# Patient Record
Sex: Male | Born: 1950 | Race: White | Hispanic: No | Smoking: Former smoker
Health system: Southern US, Community
[De-identification: ages and names within clinical notes are randomized; demographics above are authoritative.]

## PROBLEM LIST (undated history)

## (undated) DIAGNOSIS — E663 Overweight: Secondary | ICD-10-CM

## (undated) DIAGNOSIS — R319 Hematuria, unspecified: Secondary | ICD-10-CM

## (undated) DIAGNOSIS — E785 Hyperlipidemia, unspecified: Secondary | ICD-10-CM

## (undated) DIAGNOSIS — G4730 Sleep apnea, unspecified: Secondary | ICD-10-CM

## (undated) DIAGNOSIS — R972 Elevated prostate specific antigen [PSA]: Secondary | ICD-10-CM

## (undated) DIAGNOSIS — N529 Male erectile dysfunction, unspecified: Secondary | ICD-10-CM

## (undated) DIAGNOSIS — I480 Paroxysmal atrial fibrillation: Secondary | ICD-10-CM

## (undated) DIAGNOSIS — IMO0002 Reserved for concepts with insufficient information to code with codable children: Secondary | ICD-10-CM

## (undated) DIAGNOSIS — C61 Malignant neoplasm of prostate: Secondary | ICD-10-CM

## (undated) DIAGNOSIS — I499 Cardiac arrhythmia, unspecified: Secondary | ICD-10-CM

## (undated) HISTORY — DX: Cardiac arrhythmia, unspecified: I49.9

## (undated) HISTORY — DX: Hematuria, unspecified: R31.9

## (undated) HISTORY — DX: Elevated prostate specific antigen (PSA): R97.20

## (undated) HISTORY — PX: TONSILLECTOMY: SUR1361

## (undated) HISTORY — DX: Hyperlipidemia, unspecified: E78.5

## (undated) HISTORY — PX: COLONOSCOPY: SHX174

## (undated) HISTORY — PX: CATARACT EXTRACTION: SUR2

## (undated) HISTORY — DX: Malignant neoplasm of prostate: C61

## (undated) HISTORY — PX: TONSILECTOMY, ADENOIDECTOMY, BILATERAL MYRINGOTOMY AND TUBES: SHX2538

## (undated) HISTORY — PX: EYE SURGERY: SHX253

## (undated) HISTORY — DX: Overweight: E66.3

## (undated) HISTORY — DX: Reserved for concepts with insufficient information to code with codable children: IMO0002

## (undated) HISTORY — PX: SEPTOPLASTY: SUR1290

## (undated) HISTORY — DX: Male erectile dysfunction, unspecified: N52.9

---

## 2006-02-16 ENCOUNTER — Ambulatory Visit: Payer: Self-pay | Admitting: Gastroenterology

## 2006-12-09 ENCOUNTER — Ambulatory Visit: Payer: Self-pay | Admitting: Ophthalmology

## 2009-07-16 ENCOUNTER — Ambulatory Visit: Payer: Self-pay | Admitting: Gastroenterology

## 2010-12-04 ENCOUNTER — Ambulatory Visit: Payer: Self-pay | Admitting: Ophthalmology

## 2010-12-11 ENCOUNTER — Ambulatory Visit: Payer: Self-pay | Admitting: Ophthalmology

## 2014-04-05 DIAGNOSIS — I459 Conduction disorder, unspecified: Secondary | ICD-10-CM | POA: Insufficient documentation

## 2014-04-05 DIAGNOSIS — N529 Male erectile dysfunction, unspecified: Secondary | ICD-10-CM | POA: Insufficient documentation

## 2014-04-13 DIAGNOSIS — E785 Hyperlipidemia, unspecified: Secondary | ICD-10-CM | POA: Insufficient documentation

## 2015-01-08 DIAGNOSIS — C61 Malignant neoplasm of prostate: Secondary | ICD-10-CM | POA: Insufficient documentation

## 2015-01-19 ENCOUNTER — Ambulatory Visit
Admit: 2015-01-19 | Disposition: A | Discharge status: Home / Self Care | Payer: Self-pay | Attending: Gastroenterology | Admitting: Gastroenterology

## 2015-02-12 LAB — SURGICAL PATHOLOGY

## 2015-02-26 ENCOUNTER — Other Ambulatory Visit: Payer: Self-pay

## 2015-02-27 ENCOUNTER — Ambulatory Visit: Payer: Self-pay | Admitting: Physical Therapy

## 2015-03-12 ENCOUNTER — Other Ambulatory Visit: Payer: Self-pay

## 2015-03-13 ENCOUNTER — Inpatient Hospital Stay: Admission: RE | Admit: 2015-03-13 | Payer: 59 | Source: Ambulatory Visit | Admitting: Urology

## 2015-03-13 ENCOUNTER — Encounter: Admission: RE | Payer: Self-pay | Source: Ambulatory Visit

## 2015-03-13 SURGERY — ROBOTIC ASSISTED LAPAROSCOPIC RADICAL PROSTATECTOMY
Anesthesia: Choice

## 2015-04-13 ENCOUNTER — Ambulatory Visit (INDEPENDENT_AMBULATORY_CARE_PROVIDER_SITE_OTHER): Payer: 59 | Admitting: Urology

## 2015-04-13 ENCOUNTER — Encounter: Payer: Self-pay | Admitting: Urology

## 2015-04-13 ENCOUNTER — Other Ambulatory Visit: Payer: Self-pay | Admitting: Urology

## 2015-04-13 VITALS — Resp 18 | Ht 75.0 in | Wt 241.6 lb

## 2015-04-13 DIAGNOSIS — N529 Male erectile dysfunction, unspecified: Secondary | ICD-10-CM

## 2015-04-13 DIAGNOSIS — C61 Malignant neoplasm of prostate: Secondary | ICD-10-CM

## 2015-04-13 DIAGNOSIS — N528 Other male erectile dysfunction: Secondary | ICD-10-CM

## 2015-04-13 MED ORDER — LEVOFLOXACIN 500 MG PO TABS
500.0000 mg | ORAL_TABLET | Freq: Once | ORAL | Status: AC
  Administered 2015-04-13: 500 mg via ORAL

## 2015-04-13 MED ORDER — GENTAMICIN SULFATE 40 MG/ML IJ SOLN
80.0000 mg | Freq: Once | INTRAMUSCULAR | Status: AC
  Administered 2015-04-13: 80 mg via INTRAMUSCULAR

## 2015-04-13 NOTE — Patient Instructions (Signed)

## 2015-04-13 NOTE — Progress Notes (Signed)
IM Injection  Patient is present today for a prostate biopsy; he was given a prophylactic IM injection prior to the procedure: Drug: Gentamicin Dose:70ml Location: Gluteus maximus  Lot: [id number] Exp:11/2015 Patient tolerated well, no complications were noted Preformed by: Golden Hurter, CMA  Additional notes/ Follow up: Patient was also administered PO 500mg  tablet of Levofloxacin. Batch: [id number] Exp: 12/2015 Patient tolerated well, no complications were noted.

## 2015-04-13 NOTE — Progress Notes (Signed)
04/13/2015 9:50 AM   Joshua Barajas 1951-09-02 [id number]  Referring provider: [name], MD [place], [place] [address]  Chief Complaint  Patient presents with  . Prostate Cancer  . Prostate Biopsy    HPI:  64 year old male with prostate cancer dx 2/16 initially scheduled to undergo robotic prostatectomy but has since been exploring various treatment options and returns today for confirmatory prostate biopsy.  Patient underwent prostate biopsy for an accelerating PSA velocity most recently to 3.2. Prostate biopsy revealed Gleason 3+3 prostate cancer involving 6 of 12 cores bilaterally, 4 on the LEFT (lateral base, lateral mid, and lateral apex) and 2 on the RIGHT (base up to 83% tissue). His prostate exam was fairly unremarkable, diffusely firm. TRUS vol 26 cc.   He denies any significant baseline urinary symptoms other than nocturia 3. IPSS 3/0.   He does have signnificant baseline erectile dysfunction with difficultty achieving an adequate erection and maintaining it at times. SHIM 18/25 consistent with mild ED. Over the more recent past, his ED has actually been improving and he has been sexually active with adequate erection for pentration without PDE5.   No previous abdominal surgeries. No family history of prostate cancer.   PMH: Past Medical History  Diagnosis Date  . ED (erectile dysfunction)   . Dysrhythmia   . HLD (hyperlipidemia)   . Squamous cell carcinoma   . Hematuria   . Prostate cancer   . Over weight   . Rising PSA level     Surgical History: Past Surgical History  Procedure Laterality Date  . Tonsilectomy, adenoidectomy, bilateral myringotomy and tubes    . Septoplasty    . Cataract extraction Bilateral     Home Medications:    Medication List       This list is accurate as of: 04/13/15  9:50 AM.  Always use your most recent med list.               polyethylene  glycol powder powder  Commonly known as:  GLYCOLAX/MIRALAX  USE AS DIRECTED FOR COLONIC PREP.        Allergies:  Allergies  Allergen Reactions  . Latex Rash  . Other Rash    If leaves on for few days    Family History: No family history on file.  Social History:  reports that he quit smoking about 39 years ago. He does not have any smokeless tobacco history on file. He reports that he does not drink alcohol. His drug history is not on file.   Physical Exam: Resp 18  Ht 6\' 3"  (1.905 m)  Wt 241 lb 9.6 oz (109.589 kg)  BMI 30.20 kg/m2  Constitutional:  Alert and oriented, No acute distress. HEENT: [place] AT, moist mucus membranes.  Trachea midline, no masses. Cardiovascular: No clubbing, cyanosis, or edema. Respiratory: Normal respiratory effort, no increased work of breathing. GU: No CVA tenderness.  Rectal: Sphincter tone. No rectal fissures or hemorrhoids.   Skin: No rashes, bruises or suspicious lesions. Neurologic: Grossly intact, no focal deficits, moving all 4 extremities. Psychiatric: Normal mood and affect.  Prostate Biopsy Procedure   Informed consent was obtained after discussing risks/benefits of the procedure.  A time out was performed to ensure correct patient identity.  Pre-Procedure: - Gentamicin given prophylactically - Levaquin 500 mg administered PO -Transrectal Ultrasound performed revealing a 21 gm prostate -No significant hypoechoic or median lobe noted  Procedure: - Prostate block performed using  10 cc 1% lidocaine and biopsies taken from sextant areas, a total of 12 under ultrasound guidance.  Post-Procedure: - Patient tolerated the procedure well - He was counseled to seek immediate medical attention if experiences any severe pain, significant bleeding, or fevers - Return in one week to discuss biopsy results   Assessment & Plan:  64 year old male with prostate cancer considering all treatment options. He returns today for confirmatory prostate  biopsy which was uncomplicated. He did have patient return in 1-2 weeks to review results, further discuss his options.  1. Prostate cancer T1c PSA 3.2. Prostate biopsy revealed Gleason 3+3 prostate cancer involving 6 of 12 cores bilaterally, 4 on the LEFT (lateral base, lateral mid, and lateral apex) and 2 on the RIGHT (base up to 83% tissue). Prostate exam firm, no nodules. TRUS vol 26 cc. Dx 11/2014. - PSA - levofloxacin (LEVAQUIN) tablet 500 mg; Take 1 tablet (500 mg total) by mouth once. - gentamicin (GARAMYCIN) injection 80 mg; Inject 2 mLs (80 mg total) into the muscle once.  2. ED (erectile dysfunction) of organic origin  Baseline mild ED   Return in about 1 week (around 04/20/2015) for review pathology results.  Hollice Espy, MD  [facility] [address], [place], [place] [address] [phone]

## 2015-04-14 LAB — PSA: Prostate Specific Ag, Serum: 3.4 ng/mL (ref 0.0–4.0)

## 2015-04-18 LAB — PROSTATE BIOPSIES

## 2015-04-20 ENCOUNTER — Ambulatory Visit: Payer: Self-pay | Admitting: Urology

## 2015-04-25 ENCOUNTER — Other Ambulatory Visit: Payer: Self-pay | Admitting: Urology

## 2015-04-26 ENCOUNTER — Ambulatory Visit (INDEPENDENT_AMBULATORY_CARE_PROVIDER_SITE_OTHER): Payer: 59 | Admitting: Urology

## 2015-04-26 ENCOUNTER — Encounter: Payer: Self-pay | Admitting: Urology

## 2015-04-26 VITALS — BP 130/69 | HR 62 | Ht 75.0 in | Wt 240.2 lb

## 2015-04-26 DIAGNOSIS — N528 Other male erectile dysfunction: Secondary | ICD-10-CM

## 2015-04-26 DIAGNOSIS — C61 Malignant neoplasm of prostate: Secondary | ICD-10-CM

## 2015-04-26 DIAGNOSIS — N529 Male erectile dysfunction, unspecified: Secondary | ICD-10-CM

## 2015-04-26 NOTE — Progress Notes (Signed)
4:53 PM   Joshua Barajas [birth year] [id number]  Referring provider: [name], MD [place], [place] [address]  Chief Complaint  Patient presents with  . Prostate Cancer    prostate biopsy results    HPI:  64 year old male with prostate cancer dx 2/16 who returns today to discuss his confirmatory prostate biopsy results.  Patient underwent prostate biopsy for an accelerating PSA velocity most recently to 3.2. Initial prostate biopsy 2/16 revealed Gleason 3+3 prostate cancer involving 6 of 12 cores bilaterally, 4 on the LEFT (lateral base, lateral mid, and lateral apex) and 2 on the RIGHT (base up to 83% tissue). His prostate exam was fairly unremarkable, diffusely firm. TRUS vol 26 cc.    Repeat prostate biopsy on 04/13/15 shows Gleason 3+3 in 6/12 cores, 4 on the right and 2 on the left this time involving up on 33% at the left apex but otherwise farily low volume diease.  Repeat PSA on 04/14/15 3.4.    He denies any significant baseline urinary symptoms other than nocturia 3. IPSS 3/0.   He does have signnificant baseline erectile dysfunction with difficultty achieving an adequate erection and maintaining it at times. SHIM 18/25 consistent with mild ED. Over the more recent past, his ED has actually been improving and he has been sexually active with adequate erection for pentration without PDE5.   No previous abdominal surgeries. No family history of prostate cancer.  No issues since last biopsy.   He is very anxious and has been reading a ton online about alterative treatments for prostate cancer.  He has seen radiation oncology and [facility] is not a candidate for cyberknife and not trilled with the idea of brachytherapy or EBRT.  I has questions today about photon therapy, cryotherapy, and HIFU.   PMH: Past Medical History  Diagnosis Date  . ED (erectile dysfunction)   . Dysrhythmia   . HLD (hyperlipidemia)   . Squamous cell carcinoma   .  Hematuria   . Prostate cancer   . Over weight   . Rising PSA level     Surgical History: Past Surgical History  Procedure Laterality Date  . Tonsilectomy, adenoidectomy, bilateral myringotomy and tubes    . Septoplasty    . Cataract extraction Bilateral     Home Medications:    Medication List    Notice  As of 04/26/2015  4:53 PM   You have not been prescribed any medications.      Allergies:  Allergies  Allergen Reactions  . Latex Rash  . Other Rash    If leaves on for few days    Family History: History reviewed. No pertinent family history.  Social History:  reports that he quit smoking about 39 years ago. He does not have any smokeless tobacco history on file. He reports that he does not drink alcohol. His drug history is not on file.   Physical Exam: BP 130/69 mmHg  Pulse 62  Ht 6\' 3"  (1.905 m)  Wt 240 lb 3.2 oz (108.954 kg)  BMI 30.02 kg/m2  Constitutional:  Alert and oriented, No acute distress. HEENT: [place] AT, moist mucus membranes.  Trachea midline, no masses. Cardiovascular: No clubbing, cyanosis, or edema. Respiratory: Normal respiratory effort, no increased work of breathing.  Skin: No rashes, bruises or suspicious lesions. Neurologic: Grossly intact, no focal deficits, moving all 4 extremities. Psychiatric: Normal mood and affect.  Labs:  Results for orders placed or performed in visit on 04/13/15  PSA  Result Value Ref Range   Prostate Specific Ag, Serum 3.4 0.0 - 4.0 ng/mL    Imaging: No recent imaging  Assessment and Plan: A 64 year old male with fairly newly diagnosed Gleason 3+3 prostate cancer 11/2014, low risk, status post repeat confirmatory biopsy revealing essentially unchanged pathology.    1. Prostate cancer Repeat prostate biopsy pathology reviewed today in detail along with most recent PSA.  He is quite anxious about making the right choice for treating his prostate cancer. We again today reviewed all of his options including  surgery, radiation, brachytherapy, active surveillance, along with other treatment modalities such as HIFU or cryotherapy. All his questions were answered in detail. I offered him a referral to Dr. Massie Maroon again today but he declined at this time.  He has already seen [facility] radiation oncology as well.    He and his wife remained undecided about how to proceed. Additional resources given today. For now, I will put him on active surveillance and repeat his PSA rectal exam in 4 months unless he would like to proceed with symptoms other intervention prior to this.  2. ED (erectile dysfunction) of organic origin  Baseline mild ED   Return in about 4 months (around 08/27/2015) for PSA/ DRE.  Hollice Espy, MD  [place] [address], [place], [place] [address] [phone]  I spent 30 min with this patient of which greater than 50% was spent in counseling and coordination of care with the patient.

## 2015-04-28 ENCOUNTER — Encounter: Payer: Self-pay | Admitting: Urology

## 2015-09-14 ENCOUNTER — Ambulatory Visit: Payer: Self-pay | Admitting: Urology

## 2015-09-17 ENCOUNTER — Ambulatory Visit: Payer: Self-pay | Admitting: Urology

## 2015-09-24 DIAGNOSIS — C61 Malignant neoplasm of prostate: Secondary | ICD-10-CM | POA: Insufficient documentation

## 2015-11-13 ENCOUNTER — Other Ambulatory Visit: Payer: Self-pay

## 2015-11-13 NOTE — Progress Notes (Signed)
  Oncology Nurse Navigator Documentation  Navigator Location: [facility] (11/13/15 1100)                                            Time Spent with Patient: 30 (11/13/15 1100)   Patient has underwent cyberknife radiation at [facility] for his prostate cancer. Treatments finished 10/12/15

## 2016-09-24 DIAGNOSIS — Z08 Encounter for follow-up examination after completed treatment for malignant neoplasm: Secondary | ICD-10-CM | POA: Diagnosis not present

## 2016-09-24 DIAGNOSIS — D485 Neoplasm of uncertain behavior of skin: Secondary | ICD-10-CM | POA: Diagnosis not present

## 2016-09-24 DIAGNOSIS — Z1283 Encounter for screening for malignant neoplasm of skin: Secondary | ICD-10-CM | POA: Diagnosis not present

## 2016-09-24 DIAGNOSIS — L720 Epidermal cyst: Secondary | ICD-10-CM | POA: Diagnosis not present

## 2016-09-24 DIAGNOSIS — C44622 Squamous cell carcinoma of skin of right upper limb, including shoulder: Secondary | ICD-10-CM | POA: Diagnosis not present

## 2016-09-24 DIAGNOSIS — Z85828 Personal history of other malignant neoplasm of skin: Secondary | ICD-10-CM | POA: Diagnosis not present

## 2016-10-02 DIAGNOSIS — C44622 Squamous cell carcinoma of skin of right upper limb, including shoulder: Secondary | ICD-10-CM | POA: Diagnosis not present

## 2016-10-03 DIAGNOSIS — Z510 Encounter for antineoplastic radiation therapy: Secondary | ICD-10-CM | POA: Diagnosis not present

## 2016-10-03 DIAGNOSIS — C61 Malignant neoplasm of prostate: Secondary | ICD-10-CM | POA: Diagnosis not present

## 2016-10-06 DIAGNOSIS — T814XXA Infection following a procedure, initial encounter: Secondary | ICD-10-CM | POA: Diagnosis not present

## 2016-10-06 DIAGNOSIS — L03113 Cellulitis of right upper limb: Secondary | ICD-10-CM | POA: Diagnosis not present

## 2016-10-06 DIAGNOSIS — S51801A Unspecified open wound of right forearm, initial encounter: Secondary | ICD-10-CM | POA: Diagnosis not present

## 2016-10-30 DIAGNOSIS — C61 Malignant neoplasm of prostate: Secondary | ICD-10-CM | POA: Diagnosis not present

## 2016-10-30 DIAGNOSIS — N529 Male erectile dysfunction, unspecified: Secondary | ICD-10-CM | POA: Diagnosis not present

## 2016-10-30 DIAGNOSIS — Z0000 Encounter for general adult medical examination without abnormal findings: Secondary | ICD-10-CM | POA: Diagnosis not present

## 2016-10-30 DIAGNOSIS — Z23 Encounter for immunization: Secondary | ICD-10-CM | POA: Diagnosis not present

## 2016-10-30 DIAGNOSIS — Z136 Encounter for screening for cardiovascular disorders: Secondary | ICD-10-CM | POA: Diagnosis not present

## 2016-10-31 DIAGNOSIS — Z0000 Encounter for general adult medical examination without abnormal findings: Secondary | ICD-10-CM | POA: Diagnosis not present

## 2016-11-20 ENCOUNTER — Encounter: Payer: Self-pay | Admitting: Emergency Medicine

## 2016-11-20 ENCOUNTER — Emergency Department: Payer: PPO

## 2016-11-20 ENCOUNTER — Inpatient Hospital Stay
Admission: EM | Admit: 2016-11-20 | Discharge: 2016-11-22 | DRG: 310 | Disposition: A | Discharge status: Home / Self Care | Payer: PPO | Attending: Internal Medicine | Admitting: Internal Medicine

## 2016-11-20 DIAGNOSIS — E785 Hyperlipidemia, unspecified: Secondary | ICD-10-CM | POA: Diagnosis present

## 2016-11-20 DIAGNOSIS — I4891 Unspecified atrial fibrillation: Secondary | ICD-10-CM | POA: Diagnosis not present

## 2016-11-20 DIAGNOSIS — I499 Cardiac arrhythmia, unspecified: Secondary | ICD-10-CM | POA: Diagnosis not present

## 2016-11-20 DIAGNOSIS — Z8546 Personal history of malignant neoplasm of prostate: Secondary | ICD-10-CM

## 2016-11-20 DIAGNOSIS — Z79899 Other long term (current) drug therapy: Secondary | ICD-10-CM | POA: Diagnosis not present

## 2016-11-20 DIAGNOSIS — Z8249 Family history of ischemic heart disease and other diseases of the circulatory system: Secondary | ICD-10-CM | POA: Diagnosis not present

## 2016-11-20 DIAGNOSIS — Z87891 Personal history of nicotine dependence: Secondary | ICD-10-CM | POA: Diagnosis not present

## 2016-11-20 DIAGNOSIS — R002 Palpitations: Secondary | ICD-10-CM | POA: Diagnosis not present

## 2016-11-20 DIAGNOSIS — R000 Tachycardia, unspecified: Secondary | ICD-10-CM | POA: Diagnosis not present

## 2016-11-20 LAB — CBC
HCT: 37.9 % — ABNORMAL LOW (ref 40.0–52.0)
Hemoglobin: 13 g/dL (ref 13.0–18.0)
MCH: 32.3 pg (ref 26.0–34.0)
MCHC: 34.4 g/dL (ref 32.0–36.0)
MCV: 94 fL (ref 80.0–100.0)
Platelets: 191 10*3/uL (ref 150–440)
RBC: 4.03 MIL/uL — AB (ref 4.40–5.90)
RDW: 13.4 % (ref 11.5–14.5)
WBC: 5 10*3/uL (ref 3.8–10.6)

## 2016-11-20 LAB — BASIC METABOLIC PANEL
Anion gap: 7 (ref 5–15)
BUN: 18 mg/dL (ref 6–20)
CALCIUM: 8.9 mg/dL (ref 8.9–10.3)
CHLORIDE: 108 mmol/L (ref 101–111)
CO2: 23 mmol/L (ref 22–32)
CREATININE: 0.92 mg/dL (ref 0.61–1.24)
Glucose, Bld: 132 mg/dL — ABNORMAL HIGH (ref 65–99)
Potassium: 3.8 mmol/L (ref 3.5–5.1)
SODIUM: 138 mmol/L (ref 135–145)

## 2016-11-20 LAB — MAGNESIUM: MAGNESIUM: 2 mg/dL (ref 1.7–2.4)

## 2016-11-20 MED ORDER — DILTIAZEM HCL 25 MG/5ML IV SOLN
INTRAVENOUS | Status: AC
  Filled 2016-11-20: qty 5

## 2016-11-20 MED ORDER — DILTIAZEM HCL 25 MG/5ML IV SOLN
20.0000 mg | Freq: Once | INTRAVENOUS | Status: AC
  Administered 2016-11-20: 20 mg via INTRAVENOUS

## 2016-11-20 MED ORDER — DILTIAZEM HCL 125 MG/25ML IV SOLN
Freq: Once | INTRAVENOUS | Status: AC
  Administered 2016-11-20: via INTRAVENOUS
  Filled 2016-11-20: qty 25

## 2016-11-20 MED ORDER — DEXTROSE 5 % IV SOLN
5.0000 mg/h | Freq: Once | INTRAVENOUS | Status: DC
  Filled 2016-11-20: qty 100

## 2016-11-20 NOTE — ED Provider Notes (Signed)
[facility] Emergency Department Provider Note    First MD Initiated Contact with Patient 11/20/16 2308     (approximate)  I have reviewed the triage vital signs and the nursing notes.   HISTORY  Chief Complaint Irregular Heart Beat    HPI Joshua Barajas is a 66 y.o. male with a history of dysrhythmia presents with acute onset of pounding heart rate that occurred at home late this afternoon. Patient states he was just doing chores around the house. Denies any true chest pain or pressure but feels that he cannot catch his breath due to palpitations. He is not on any chronic medications for this. Was seen for similar 2 years ago in [place]. Denies any history of heart attacks. Since states that he is been elevated recently orthopnea or lower extremity edema.   Past Medical History:  Diagnosis Date  . Dysrhythmia   . ED (erectile dysfunction)   . Hematuria   . HLD (hyperlipidemia)   . Over weight   . Prostate cancer ([place])   . Rising PSA level   . Squamous cell carcinoma    No family history on file. Past Surgical History:  Procedure Laterality Date  . CATARACT EXTRACTION Bilateral   . SEPTOPLASTY    . TONSILECTOMY, ADENOIDECTOMY, BILATERAL MYRINGOTOMY AND TUBES     Patient Active Problem List   Diagnosis Date Noted  . CA of prostate ([place]) 01/08/2015  . HLD (hyperlipidemia) 04/13/2014  . 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 ED (erectile dysfunction) of organic origin 04/05/2014      Prior to Admission medications   Not on File    Allergies Latex and Other    Social History Social History  Substance Use Topics  . Smoking status: Former Smoker    Quit date: 04/10/1976  . Smokeless tobacco: Not on file  . Alcohol use No    Review of Systems Patient denies headaches, rhinorrhea, blurry vision, numbness, shortness of breath, chest pain, edema, cough, abdominal pain, nausea, vomiting, diarrhea, dysuria, fevers, rashes or  hallucinations unless otherwise stated above in HPI. ____________________________________________   PHYSICAL EXAM:  VITAL SIGNS: Vitals:   11/20/16 2247 11/20/16 2300  BP: (!) 150/75 113/62  Pulse: (!) 155 90  Resp: 20 16  Temp: 98.2 F (36.8 C)     Constitutional: Alert and oriented. Well appearing and in no acute distress. Eyes: Conjunctivae are normal. PERRL. EOMI. Head: Atraumatic. Nose: No congestion/rhinnorhea. Mouth/Throat: Mucous membranes are moist.  Oropharynx non-erythematous. Neck: No stridor. Painless ROM. No cervical spine tenderness to palpation Hematological/Lymphatic/Immunilogical: No cervical lymphadenopathy. Cardiovascular: tachycardic, irregularly irregular. Grossly normal heart sounds.  Good peripheral circulation. Respiratory: Normal respiratory effort.  No retractions. Lungs CTAB. Gastrointestinal: Soft and nontender. No distention. No abdominal bruits. No CVA tenderness. Genitourinary:  Musculoskeletal: No lower extremity tenderness nor edema.  No joint effusions. Neurologic:  Normal speech and language. No gross focal neurologic deficits are appreciated. No gait instability. Skin:  Skin is warm, dry and intact. No rash noted. Psychiatric: Mood and affect are normal. Speech and behavior are normal.  ____________________________________________   LABS (all labs ordered are listed, but only abnormal results are displayed)  Results for orders placed or performed during the hospital encounter of 11/20/16 (from the past 24 hour(s))  Basic metabolic panel     Status: Abnormal   Collection Time: 11/20/16 10:58 PM  Result Value Ref Range   Sodium 138 135 - 145 mmol/L   Potassium 3.8 3.5 - 5.1 mmol/L  Chloride 108 101 - 111 mmol/L   CO2 23 22 - 32 mmol/L   Glucose, Bld 132 (H) 65 - 99 mg/dL   BUN 18 6 - 20 mg/dL   Creatinine, Ser 0.92 0.61 - 1.24 mg/dL   Calcium 8.9 8.9 - 10.3 mg/dL   GFR calc non Af Amer >60 >60 mL/min   GFR calc Af Amer >60 >60  mL/min   Anion gap 7 5 - 15  CBC     Status: Abnormal   Collection Time: 11/20/16 10:58 PM  Result Value Ref Range   WBC 5.0 3.8 - 10.6 K/uL   RBC 4.03 (L) 4.40 - 5.90 MIL/uL   Hemoglobin 13.0 13.0 - 18.0 g/dL   HCT 37.9 (L) 40.0 - 52.0 %   MCV 94.0 80.0 - 100.0 fL   MCH 32.3 26.0 - 34.0 pg   MCHC 34.4 32.0 - 36.0 g/dL   RDW 13.4 11.5 - 14.5 %   Platelets 191 150 - 440 K/uL  Magnesium     Status: None   Collection Time: 11/20/16 10:58 PM  Result Value Ref Range   Magnesium 2.0 1.7 - 2.4 mg/dL   ____________________________________________  EKG My review and personal interpretation at Time: 19:31   Indication: palpitations  Rate: 140  Rhythm: afib with rvr Axis: normal Other: non specific st changes, likely rate dependents ____________________________________________  RADIOLOGY  I personally reviewed all radiographic images ordered to evaluate for the above acute complaints and reviewed radiology reports and findings.  These findings were personally discussed with the patient.  Please see medical record for radiology report.  ____________________________________________   PROCEDURES  Procedure(s) performed:  Procedures    Critical Care performed: yes CRITICAL CARE Performed by: Merlyn Lot   Total critical care time: 40 minutes  Critical care time was exclusive of separately billable procedures and treating other patients.  Critical care was necessary to treat or prevent imminent or life-threatening deterioration.  Critical care was time spent personally by me on the following activities: development of treatment plan with patient and/or surrogate as well as nursing, discussions with consultants, evaluation of patient's response to treatment, examination of patient, obtaining history from patient or surrogate, ordering and performing treatments and interventions, ordering and review of laboratory studies, ordering and review of radiographic studies, pulse  oximetry and re-evaluation of patient's condition.  ____________________________________________   INITIAL IMPRESSION / ASSESSMENT AND PLAN / ED COURSE  Pertinent labs & imaging results that were available during my care of the patient we  re reviewed by me and considered in my medical decision making (see chart for details).  DDX: dysrhythmia, acs, electrolye abn, dehydration  Uno K Qian is a 67 y.o. who presents to the ED with palpitations arriving with A. fib with RVR.patient normotensive therefore based on his rate elected to try to obtain rate control with IV Cardizem bolus and drip. Patient denying any active chest pain and there is no evidence of significant ischemia on EKG. I do feel this is likely underlying dysrhythmia. Patient without any significant electrolyte abnormality.  Based on patient's age and symptoms do feel patient will require admission for further titration and management of his A. fib with RVR.  Have discussed with the patient and available family all diagnostics and treatments performed thus far and all questions were answered to the best of my ability. The patient demonstrates understanding and agreement with plan.       ____________________________________________   FINAL CLINICAL IMPRESSION(S) / ED DIAGNOSES  Final  diagnoses:  Atrial fibrillation with rapid ventricular response (HCC)  Palpitations      NEW MEDICATIONS STARTED DURING THIS VISIT:  New Prescriptions   No medications on file     Note:  This document was prepared using Dragon voice recognition software and may include unintentional dictation errors.    Merlyn Lot, MD 11/21/16 [phone]

## 2016-11-20 NOTE — ED Triage Notes (Addendum)
Pt complains of bounding heartrate coming from home via EMS that started this afternoon.

## 2016-11-21 ENCOUNTER — Inpatient Hospital Stay: Admit: 2016-11-21 | Payer: PPO

## 2016-11-21 ENCOUNTER — Encounter: Payer: Self-pay | Admitting: Internal Medicine

## 2016-11-21 DIAGNOSIS — Z79899 Other long term (current) drug therapy: Secondary | ICD-10-CM | POA: Diagnosis not present

## 2016-11-21 DIAGNOSIS — R002 Palpitations: Secondary | ICD-10-CM | POA: Diagnosis not present

## 2016-11-21 DIAGNOSIS — Z87891 Personal history of nicotine dependence: Secondary | ICD-10-CM | POA: Diagnosis not present

## 2016-11-21 DIAGNOSIS — I4891 Unspecified atrial fibrillation: Secondary | ICD-10-CM | POA: Diagnosis present

## 2016-11-21 DIAGNOSIS — Z8546 Personal history of malignant neoplasm of prostate: Secondary | ICD-10-CM | POA: Diagnosis not present

## 2016-11-21 DIAGNOSIS — I480 Paroxysmal atrial fibrillation: Secondary | ICD-10-CM | POA: Diagnosis not present

## 2016-11-21 DIAGNOSIS — Z8249 Family history of ischemic heart disease and other diseases of the circulatory system: Secondary | ICD-10-CM | POA: Diagnosis not present

## 2016-11-21 DIAGNOSIS — E785 Hyperlipidemia, unspecified: Secondary | ICD-10-CM | POA: Diagnosis not present

## 2016-11-21 DIAGNOSIS — C61 Malignant neoplasm of prostate: Secondary | ICD-10-CM | POA: Diagnosis not present

## 2016-11-21 LAB — BASIC METABOLIC PANEL
ANION GAP: 6 (ref 5–15)
BUN: 12 mg/dL (ref 6–20)
CALCIUM: 8.6 mg/dL — AB (ref 8.9–10.3)
CO2: 23 mmol/L (ref 22–32)
CREATININE: 0.81 mg/dL (ref 0.61–1.24)
Chloride: 110 mmol/L (ref 101–111)
GFR calc Af Amer: >60 mL/min (ref >60)
Glucose, Bld: 102 mg/dL — ABNORMAL HIGH (ref 65–99)
Potassium: 3.6 mmol/L (ref 3.5–5.1)
Sodium: 139 mmol/L (ref 135–145)

## 2016-11-21 LAB — TROPONIN I
Troponin I: <0.03 ng/mL (ref <0.03)
Troponin I: <0.03 ng/mL (ref <0.03)
Troponin I: <0.03 ng/mL (ref <0.03)

## 2016-11-21 LAB — LIPID PANEL
Cholesterol: 143 mg/dL (ref 0–200)
HDL: 34 mg/dL — ABNORMAL LOW (ref >40)
LDL CALC: 96 mg/dL (ref 0–99)
TRIGLYCERIDES: 66 mg/dL (ref <150)
Total CHOL/HDL Ratio: 4.2 RATIO
VLDL: 13 mg/dL (ref 0–40)

## 2016-11-21 MED ORDER — DILTIAZEM HCL 125 MG/25ML IV SOLN
INTRAVENOUS | Status: DC
  Filled 2016-11-21 (×2): qty 25

## 2016-11-21 MED ORDER — APIXABAN 5 MG PO TABS
5.0000 mg | ORAL_TABLET | Freq: Two times a day (BID) | ORAL | Status: DC
  Administered 2016-11-21 – 2016-11-22 (×3): 5 mg via ORAL
  Filled 2016-11-21 (×3): qty 1

## 2016-11-21 MED ORDER — ONDANSETRON HCL 4 MG/2ML IJ SOLN: 4.0000 mg | Freq: Four times a day (QID) | INTRAMUSCULAR | Status: DC | PRN

## 2016-11-21 MED ORDER — ACETAMINOPHEN 325 MG PO TABS
650.0000 mg | ORAL_TABLET | ORAL | Status: DC | PRN
  Administered 2016-11-21: 650 mg via ORAL
  Filled 2016-11-21: qty 2

## 2016-11-21 MED ORDER — DILTIAZEM HCL 100 MG IV SOLR: 5.0000 mg/h | INTRAVENOUS | Status: DC

## 2016-11-21 MED ORDER — ENOXAPARIN SODIUM 40 MG/0.4ML ~~LOC~~ SOLN
40.0000 mg | SUBCUTANEOUS | Status: DC
  Administered 2016-11-21: 40 mg via SUBCUTANEOUS
  Filled 2016-11-21: qty 0.4

## 2016-11-21 MED ORDER — DILTIAZEM HCL 60 MG PO TABS
60.0000 mg | ORAL_TABLET | Freq: Four times a day (QID) | ORAL | Status: DC
  Administered 2016-11-21 – 2016-11-22 (×5): 60 mg via ORAL
  Filled 2016-11-21 (×4): qty 1

## 2016-11-21 MED ORDER — SODIUM CHLORIDE 0.9% FLUSH: 3.0000 mL | INTRAVENOUS | Status: DC | PRN

## 2016-11-21 MED ORDER — SODIUM CHLORIDE 0.9 % IV BOLUS (SEPSIS)
1000.0000 mL | Freq: Once | INTRAVENOUS | Status: AC
  Administered 2016-11-21: 1000 mL via INTRAVENOUS

## 2016-11-21 MED ORDER — ASPIRIN EC 81 MG PO TBEC
81.0000 mg | DELAYED_RELEASE_TABLET | Freq: Every day | ORAL | Status: DC
  Administered 2016-11-21 – 2016-11-22 (×2): 81 mg via ORAL
  Filled 2016-11-21 (×2): qty 1

## 2016-11-21 MED ORDER — SODIUM CHLORIDE 0.9% FLUSH
3.0000 mL | Freq: Two times a day (BID) | INTRAVENOUS | Status: DC
  Administered 2016-11-21 – 2016-11-22 (×3): 3 mL via INTRAVENOUS

## 2016-11-21 MED ORDER — TAMSULOSIN HCL 0.4 MG PO CAPS
0.4000 mg | ORAL_CAPSULE | Freq: Every day | ORAL | Status: DC
Start: 2016-11-21 — End: 2016-11-22
  Administered 2016-11-21 – 2016-11-22 (×2): 0.4 mg via ORAL
  Filled 2016-11-21 (×2): qty 1

## 2016-11-21 MED ORDER — SODIUM CHLORIDE 0.9 % IV SOLN: 250.0000 mL | INTRAVENOUS | Status: DC | PRN

## 2016-11-21 NOTE — Progress Notes (Signed)
[facility] [facility] [place] NAME: Joshua Barajas    MR#:  [id number]  DATE OF BIRTH:  Mar 30, 1951  SUBJECTIVE:  Came in with palpitations and found to be in afib with RVR.  No new complaints  REVIEW OF SYSTEMS:   Review of Systems  Constitutional: Negative for chills, fever and weight loss.  HENT: Negative for ear discharge, ear pain and nosebleeds.   Eyes: Negative for blurred vision, pain and discharge.  Respiratory: Negative for sputum production, shortness of breath, wheezing and stridor.   Cardiovascular: Positive for palpitations. Negative for chest pain, orthopnea and PND.  Gastrointestinal: Negative for abdominal pain, diarrhea, nausea and vomiting.  Genitourinary: Negative for frequency and urgency.  Musculoskeletal: Negative for back pain and joint pain.  Neurological: Negative for sensory change, speech change, focal weakness and weakness.  Psychiatric/Behavioral: Negative for depression and hallucinations. The patient is not nervous/anxious.    Tolerating Diet:yes Tolerating PT: ambulatory  DRUG ALLERGIES:   Allergies  Allergen Reactions  . Latex Rash  . Other Rash    If leaves on for few days    VITALS:  Blood pressure 104/67, pulse 74, temperature 97.7 F (36.5 C), temperature source Oral, resp. rate 20, height 6\' 3"  (1.905 m), weight 111.8 kg (246 lb 6.4 oz), SpO2 99 %.  PHYSICAL EXAMINATION:   Physical Exam  GENERAL:  66 y.o.-year-old patient lying in the bed with no acute distress.  EYES: Pupils equal, round, reactive to light and accommodation. No scleral icterus. Extraocular muscles intact.  HEENT: Head atraumatic, normocephalic. Oropharynx and nasopharynx clear.  NECK:  Supple, no jugular venous distention. No thyroid enlargement, no tenderness.  LUNGS: Normal breath sounds bilaterally, no wheezing, rales, rhonchi. No use of accessory muscles of respiration.  CARDIOVASCULAR: S1, S2 normal. No murmurs, rubs,  or gallops.  ABDOMEN: Soft, nontender, nondistended. Bowel sounds present. No organomegaly or mass.  EXTREMITIES: No cyanosis, clubbing or edema b/l.    NEUROLOGIC: Cranial nerves II through XII are intact. No focal Motor or sensory deficits b/l.   PSYCHIATRIC:  patient is alert and oriented x 3.  SKIN: No obvious rash, lesion, or ulcer.   LABORATORY PANEL:  CBC  Recent Labs Lab 11/20/16 2258  WBC 5.0  HGB 13.0  HCT 37.9*  PLT 191    Chemistries   Recent Labs Lab 11/20/16 2258 11/21/16 0612  NA 138 139  K 3.8 3.6  CL 108 110  CO2 23 23  GLUCOSE 132* 102*  BUN 18 12  CREATININE 0.92 0.81  CALCIUM 8.9 8.6*  MG 2.0  --    Cardiac Enzymes  Recent Labs Lab 11/21/16 0612  TROPONINI <0.03   RADIOLOGY:  Dg Chest 2 View  Result Date: 11/20/2016 CLINICAL DATA:  Acute onset of tachycardia. Pounding heart rate. Initial encounter. EXAM: CHEST  2 VIEW COMPARISON:  None. FINDINGS: The lungs are well-aerated and clear. There is no evidence of focal opacification, pleural effusion or pneumothorax. The heart is normal in size; the mediastinal contour is within normal limits. No acute osseous abnormalities are seen. IMPRESSION: No acute cardiopulmonary process seen. Electronically Signed   By: Garald Balding M.D.   On: 11/20/2016 23:47   ASSESSMENT AND PLAN:   Parin Bonam  is a 66 y.o. male with a known history of Dysarhythmia, hyperlipidemia, prostate cancer, squamous cell cancer presented to the emergency room with irregular heartbeat. Patient felt palpitations. No complaints of any chest pain, shortness of breath. Patient was evaluated  in the emergency was found to be in atrial fibrillation with rapid rate  1. Atrial fibrillation with rapid rate-new onset -was on cardizem gtt---oral CCB -po eliquis per dr Josefa Half -echo  2. Palpitations secondary to atrial fibrillation -improving  3. Hyperlipidemia -statins  4. History of prostate cancer -completed rx  Case discussed  with Care Management/Social Worker. Management plans discussed with the patient, family and they are in agreement.  CODE STATUS: FULL DVT Prophylaxis: eliquis  TOTAL TIME TAKING CARE OF THIS PATIENT: 30 minutes.  >50% time spent on counselling and coordination of care pt and dr Josefa Half  POSSIBLE D/C IN *1 DAYS, DEPENDING ON CLINICAL CONDITION.  Note: This dictation was prepared with Dragon dictation along with smaller phrase technology. Any transcriptional errors that result from this process are unintentional.  Ayelet Gruenewald M.D on 11/21/2016 at 12:05 PM  Between 7am to 6pm - Pager - [phone]  After 6pm go to [email or web address] - password [email or web address] [facility]  Office  [phone]  CC: Primary care physician; [facility], Chrissie Noa, MD

## 2016-11-21 NOTE — ED Notes (Signed)
Did not titriate up cardizem drip due to patient BP staying around 100/75

## 2016-11-21 NOTE — Consult Note (Signed)
[facility] Cardiology  CARDIOLOGY CONSULT NOTE  Patient ID: Joshua Barajas MRN: [id number] DOB/AGE: Mar 27, 1951 66 y.o.  Admit date: 11/20/2016 Referring Physician Py[place]dy Primary Physician [place] Primary Cardiologist Fath Reason for Consultation Atrial fibrillation  HPI: 66 year old gentleman referred for evaluation of atrial fibrillation. The patient was in his usual state of health until he developed irregular heart rhythm and presented to [facility] emergency room. ECG revealed atrial fibrillation with a rapid ventricular rate in the 150 BPM. The patient was treated with Cardizem bolus and drip rate control, has remained in atrial fibrillation. Patient currently denies chest pain, shortness of breath or palpitations. Admission labs were notable for negative troponin. The patient reports no similar occurrence approximately 2 [place] the beach. He was told he had an episode of SVT.  Review of systems complete and found to be negative unless listed above     Past Medical History:  Diagnosis Date  . Dysrhythmia   . ED (erectile dysfunction)   . Hematuria   . HLD (hyperlipidemia)   . Over weight   . Prostate cancer ([place])   . Rising PSA level   . Squamous cell carcinoma     Past Surgical History:  Procedure Laterality Date  . CATARACT EXTRACTION Bilateral   . SEPTOPLASTY    . TONSILECTOMY, ADENOIDECTOMY, BILATERAL MYRINGOTOMY AND TUBES      Prescriptions Prior to Admission  Medication Sig Dispense Refill Last Dose  . tamsulosin (FLOMAX) 0.4 MG CAPS capsule Take 1 capsule by mouth daily.      Social History   Social History  . Marital status: Married    Spouse name: N/A  . Number of children: N/A  . Years of education: N/A   Occupational History  . retired    Social History Main Topics  . Smoking status: Former Smoker    Quit date: 04/10/1976  . Smokeless tobacco: Never Used  . Alcohol use No  . Drug use: No  . Sexual activity: Not on file   Other Topics Concern   . Not on file   Social History Narrative  . No narrative on file    Family History  Problem Relation Age of Onset  . Heart attack Father   . Rheum arthritis Father   . Heart disease Paternal Uncle       Review of systems complete and found to be negative unless listed above      PHYSICAL EXAM  General: Well developed, well nourished, in no acute distress HEENT:  Normocephalic and atramatic Neck:  No JVD.  Lungs: Clear bilaterally to auscultation and percussion. Heart: HRRR . Normal S1 and S2 without gallops or murmurs.  Abdomen: Bowel sounds are positive, abdomen soft and non-tender  Msk:  Back normal, normal gait. Normal strength and tone for age. Extremities: No clubbing, cyanosis or edema.   Neuro: Alert and oriented X 3. Psych:  Good affect, responds appropriately  Labs:   Lab Results  Component Value Date   WBC 5.0 11/20/2016   HGB 13.0 11/20/2016   HCT 37.9 (L) 11/20/2016   MCV 94.0 11/20/2016   PLT 191 11/20/2016    Recent Labs Lab 11/21/16 0612  NA 139  K 3.6  CL 110  CO2 23  BUN 12  CREATININE 0.81  CALCIUM 8.6*  GLUCOSE 102*   Lab Results  Component Value Date   TROPONINI <0.03 11/21/2016    Lab Results  Component Value Date   CHOL 143 11/21/2016   Lab Results  Component Value Date   HDL 34 (L) 11/21/2016   Lab Results  Component Value Date   LDLCALC 96 11/21/2016   Lab Results  Component Value Date   TRIG 66 11/21/2016   Lab Results  Component Value Date   CHOLHDL 4.2 11/21/2016   No results found for: LDLDIRECT    Radiology: Dg Chest 2 View  Result Date: 11/20/2016 CLINICAL DATA:  Acute onset of tachycardia. Pounding heart rate. Initial encounter. EXAM: CHEST  2 VIEW COMPARISON:  None. FINDINGS: The lungs are well-aerated and clear. There is no evidence of focal opacification, pleural effusion or pneumothorax. The heart is normal in size; the mediastinal contour is within normal limits. No acute osseous abnormalities are  seen. IMPRESSION: No acute cardiopulmonary process seen. Electronically Signed   By: Garald Balding M.D.   On: 11/20/2016 23:47    EKG: Atrial fibrillation  ASSESSMENT AND PLAN:   1. Atrial fibrillation with a rapid ventricular rate, rate controlled on Cardizem drip, chads Vascor of 1, currently asymptomatic, troponin negative  Recommendations  1. Start diltiazem 60 mg by mouth every 6 2. DC diltiazem drip 3. Start Eliquis 5 mg twice a day 4. Review 2-D echocardiogram   Signed: Isaias Cowman MD,PhD, [facility] 11/21/2016, 7:59 AM

## 2016-11-21 NOTE — Care Management Important Message (Signed)
Important Message  Patient Details  Name: Joshua Barajas MRN: [id number] Date of Birth: 1951-07-24   Medicare Important Message Given:  Yes  Initial signed IM printed from Epic and given to patient.    Katrina Stack, RN 11/21/2016, 6:58 PM

## 2016-11-21 NOTE — ED Notes (Addendum)
Cardizem drip dose rate changed to 7.5mg /hr per order. BP 119/76 HR 128

## 2016-11-21 NOTE — H&P (Signed)
[place] at [place] NAME: Joshua Barajas    MR#:  [id number]  DATE OF BIRTH:  1951-03-02  DATE OF ADMISSION:  11/20/2016  PRIMARY CARE PHYSICIAN: FELDPAUSCH, Chrissie Noa, MD   REQUESTING/REFERRING PHYSICIAN:   CHIEF COMPLAINT:   Chief Complaint  Patient presents with  . Irregular Heart Beat    HISTORY OF PRESENT ILLNESS: Bridge Marz  is a 66 y.o. male with a known history of Dysarhythmia, hyperlipidemia, prostate cancer, squamous cell cancer presented to the emergency room with irregular heartbeat. Patient felt palpitations. No complaints of any chest pain, shortness of breath. Patient was evaluated in the emergency was found to be in atrial fibrillation with rapid rate. He was started on IV Cardizem drip for rate control. No history of any fever, chills and cough. No history of headache, dizziness and blurry vision. No history of syncope or seizure. Hospitalist service was consulted for further care of the patient.  PAST MEDICAL HISTORY:   Past Medical History:  Diagnosis Date  . Dysrhythmia   . ED (erectile dysfunction)   . Hematuria   . HLD (hyperlipidemia)   . Over weight   . Prostate cancer ([place])   . Rising PSA level   . Squamous cell carcinoma     PAST SURGICAL HISTORY: Past Surgical History:  Procedure Laterality Date  . CATARACT EXTRACTION Bilateral   . SEPTOPLASTY    . TONSILECTOMY, ADENOIDECTOMY, BILATERAL MYRINGOTOMY AND TUBES      SOCIAL HISTORY:  Social History  Substance Use Topics  . Smoking status: Former Smoker    Quit date: 04/10/1976  . Smokeless tobacco: Never Used  . Alcohol use No    FAMILY HISTORY:  Family History  Problem Relation Age of Onset  . Heart attack Father   . Rheum arthritis Father   . Heart disease Paternal Uncle     DRUG ALLERGIES:  Allergies  Allergen Reactions  . Latex Rash  . Other Rash    If leaves on for few days    REVIEW OF SYSTEMS:   CONSTITUTIONAL: No fever,  fatigue or weakness.  EYES: No blurred or double vision.  EARS, NOSE, AND THROAT: No tinnitus or ear pain.  RESPIRATORY: No cough, shortness of breath, wheezing or hemoptysis.  CARDIOVASCULAR: No chest pain, orthopnea, edema. Has palpitations.  GASTROINTESTINAL: No nausea, vomiting, diarrhea or abdominal pain.  GENITOURINARY: No dysuria, hematuria.  ENDOCRINE: No polyuria, nocturia,  HEMATOLOGY: No anemia, easy bruising or bleeding SKIN: No rash or lesion. MUSCULOSKELETAL: No joint pain or arthritis.   NEUROLOGIC: No tingling, numbness, weakness.  PSYCHIATRY: No anxiety or depression.   MEDICATIONS AT HOME:  Prior to Admission medications   Medication Sig Start Date End Date Taking? Authorizing Provider  tamsulosin (FLOMAX) 0.4 MG CAPS capsule Take 1 capsule by mouth daily. 05/02/16 05/02/17 Yes Historical Provider, MD      PHYSICAL EXAMINATION:   VITAL SIGNS: Blood pressure 99/68, pulse 99, temperature 98.2 F (36.8 C), temperature source Oral, resp. rate 14, height 6\' 3"  (1.905 m), weight 115.7 kg (255 lb), SpO2 97 %.  GENERAL:  66 y.o.-year-old patient lying in the bed with no acute distress.  EYES: Pupils equal, round, reactive to light and accommodation. No scleral icterus. Extraocular muscles intact.  HEENT: Head atraumatic, normocephalic. Oropharynx and nasopharynx clear.  NECK:  Supple, no jugular venous distention. No thyroid enlargement, no tenderness.  LUNGS: Normal breath sounds bilaterally, no wheezing, rales,rhonchi or crepitation. No use of accessory muscles  of respiration.  CARDIOVASCULAR: S1, S2 irregular. No murmurs, rubs, or gallops.  ABDOMEN: Soft, nontender, nondistended. Bowel sounds present. No organomegaly or mass.  EXTREMITIES: No pedal edema, cyanosis, or clubbing.  NEUROLOGIC: Cranial nerves II through XII are intact. Muscle strength 5/5 in all extremities. Sensation intact. Gait not checked.  PSYCHIATRIC: The patient is alert and oriented x 3.  SKIN: No  obvious rash, lesion, or ulcer.   LABORATORY PANEL:   CBC  Recent Labs Lab 11/20/16 2258  WBC 5.0  HGB 13.0  HCT 37.9*  PLT 191  MCV 94.0  MCH 32.3  MCHC 34.4  RDW 13.4   ------------------------------------------------------------------------------------------------------------------  Chemistries   Recent Labs Lab 11/20/16 2258  NA 138  K 3.8  CL 108  CO2 23  GLUCOSE 132*  BUN 18  CREATININE 0.92  CALCIUM 8.9  MG 2.0   ------------------------------------------------------------------------------------------------------------------ estimated creatinine clearance is 109.8 mL/min (by C-G formula based on SCr of 0.92 mg/dL). ------------------------------------------------------------------------------------------------------------------ No results for input(s): TSH, T4TOTAL, T3FREE, THYROIDAB in the last 72 hours.  Invalid input(s): FREET3   Coagulation profile No results for input(s): INR, PROTIME in the last 168 hours. ------------------------------------------------------------------------------------------------------------------- No results for input(s): DDIMER in the last 72 hours. -------------------------------------------------------------------------------------------------------------------  Cardiac Enzymes  Recent Labs Lab 11/19/16 2258  TROPONINI <0.03   ------------------------------------------------------------------------------------------------------------------ Invalid input(s): POCBNP  ---------------------------------------------------------------------------------------------------------------  Urinalysis No results found for: COLORURINE, APPEARANCEUR, LABSPEC, PHURINE, GLUCOSEU, HGBUR, BILIRUBINUR, KETONESUR, PROTEINUR, UROBILINOGEN, NITRITE, LEUKOCYTESUR   RADIOLOGY: Dg Chest 2 View  Result Date: 11/20/2016 CLINICAL DATA:  Acute onset of tachycardia. Pounding heart rate. Initial encounter. EXAM: CHEST  2 VIEW COMPARISON:  None.  FINDINGS: The lungs are well-aerated and clear. There is no evidence of focal opacification, pleural effusion or pneumothorax. The heart is normal in size; the mediastinal contour is within normal limits. No acute osseous abnormalities are seen. IMPRESSION: No acute cardiopulmonary process seen. Electronically Signed   By: Garald Balding M.D.   On: 11/20/2016 23:47    EKG: Orders placed or performed during the hospital encounter of 11/20/16  . EKG 12-Lead  . EKG 12-Lead  . ED EKG  . ED EKG    IMPRESSION AND PLAN: 66 year old male patient with history of prostate cancer, hyperlipidemia, erectile dysfunction, squamous cell carcinoma presented to the emergency room with palpitations. Admitting diagnosis 1. Atrial fibrillation with rapid rate 2. Palpitations secondary to atrial fibrillation 3. Hyperlipidemia 4. History of prostate cancer Treatment plan Admit patient to stepdown unit Continue IV Cardizem drip Cardiology consultation Cycle troponin to rule out ischemia Check echocardiogram to assess LV function Supportive care.  All the records are reviewed and case discussed with ED provider. Management plans discussed with the patient, family and they are in agreement.  CODE STATUS:FULL CODE Code Status History    This patient does not have a recorded code status. Please follow your organizational policy for patients in this situation.       TOTAL TIME TAKING CARE OF THIS PATIENT: 50 minutes.    Saundra Shelling M.D on 11/21/2016 at 3:27 AM  Between 7am to 6pm - Pager - [phone]  After 6pm go to [email or web address] - password [email or web address] [facility]  [place] [facility]  Office  [phone]  CC: Primary care physician; [facility], Chrissie Noa, MD

## 2016-11-21 NOTE — Care Management (Signed)
Provided patient with 30 day trial coupon for eliquis

## 2016-11-21 NOTE — Progress Notes (Signed)
DR Paraschos was made aware of pt's HR in the 110-140  Non sustain, no new order at this time , will continue to monitor

## 2016-11-21 NOTE — Progress Notes (Signed)
Patient counseled on apixaban by pharmacy on 11/21/16.   Lenis Noon, PharmD, BCPS 11/21/16 11:41 AM

## 2016-11-21 NOTE — Progress Notes (Signed)
A&O. Admitted through the ED for Afib with RVR. On cardizem drip. Rate controlled now, SR. No complaints. No S/S distress.

## 2016-11-22 ENCOUNTER — Inpatient Hospital Stay
Admit: 2016-11-22 | Discharge: 2016-11-22 | Disposition: A | Discharge status: Home / Self Care | Payer: PPO | Attending: Internal Medicine | Admitting: Internal Medicine

## 2016-11-22 LAB — ECHOCARDIOGRAM COMPLETE
Height: 75 in
WEIGHTICAEL: 3942.4 [oz_av]

## 2016-11-22 MED ORDER — METOPROLOL TARTRATE 25 MG PO TABS
25.0000 mg | ORAL_TABLET | Freq: Two times a day (BID) | ORAL | Status: DC
  Administered 2016-11-22: 25 mg via ORAL
  Filled 2016-11-22: qty 1

## 2016-11-22 MED ORDER — DILTIAZEM HCL ER COATED BEADS 240 MG PO CP24: 240.0000 mg | ORAL_CAPSULE | Freq: Every day | ORAL | 2 refills | Status: DC

## 2016-11-22 MED ORDER — METOPROLOL TARTRATE 25 MG PO TABS: 25.0000 mg | ORAL_TABLET | Freq: Two times a day (BID) | ORAL | 2 refills | Status: AC

## 2016-11-22 MED ORDER — APIXABAN 5 MG PO TABS: 5.0000 mg | ORAL_TABLET | Freq: Two times a day (BID) | ORAL | 2 refills | Status: AC

## 2016-11-22 MED ORDER — DILTIAZEM HCL ER COATED BEADS 240 MG PO CP24
240.0000 mg | ORAL_CAPSULE | Freq: Every day | ORAL | Status: DC
  Administered 2016-11-22: 240 mg via ORAL
  Filled 2016-11-22: qty 1

## 2016-11-22 NOTE — Progress Notes (Signed)
Patient is discharge home in a stable condition, HR with activity after long acting cardizem and metoprolol given was between 110's - 130's , md made aware , summary and f/u care given , verbalized understanding , left with family

## 2016-11-22 NOTE — Discharge Summary (Signed)
[place] at [place] NAME: Joshua Barajas    MR#:  [id number]  DATE OF BIRTH:  1950/11/15  DATE OF ADMISSION:  11/20/2016 ADMITTING PHYSICIAN: Saundra Shelling, MD  DATE OF DISCHARGE: 11/22/16  PRIMARY CARE PHYSICIAN: FELDPAUSCH, DALE E, MD    ADMISSION DIAGNOSIS:  Palpitations [R00.2] Atrial fibrillation with rapid ventricular response ([place]) [I48.91]  DISCHARGE DIAGNOSIS:  New onset atrial fibrillation with RVR  SECONDARY DIAGNOSIS:   Past Medical History:  Diagnosis Date  . Dysrhythmia   . ED (erectile dysfunction)   . Hematuria   . HLD (hyperlipidemia)   . Over weight   . Prostate cancer ([place])   . Rising PSA level   . Squamous cell carcinoma     HOSPITAL COURSE:  SammyPuckettis a 66 y.o.malewith a known history of Dysarhythmia,hyperlipidemia, prostate cancer, squamous cell cancer presented to the emergency room with irregular heartbeat.Patient felt palpitations. No complaints of any chest pain, shortness of breath.Patient was evaluated in the emergency was found to be in atrial fibrillation with rapid rate  1.Atrial fibrillation with rapid rate-new onset -was on cardizem gtt---oral CCB CD 240 mg daily and now on metoprolol 25 mg bid -HR 80-90's and jumps to 100's with activity. Overall pt not experiencing any dizziness or palpitations -per Dr Josefa Half if cont tolerated above meds ok to d/c later today -po eliquis per dr Josefa Half -echo EF 60%  2.Palpitations secondary to atrial fibrillation -improved  3.Hyperlipidemia -statins  4.History of prostate cancer -completed rx -on flomax  If remains stable will consider d/c later today  CONSULTS OBTAINED:  Treatment Team:  Clabe Seal, PA-C Isaias Cowman, MD  DRUG ALLERGIES:   Allergies  Allergen Reactions  . Latex Rash  . Other Rash    If leaves on for few days    DISCHARGE MEDICATIONS:   Current Discharge Medication List    START  taking these medications   Details  apixaban (ELIQUIS) 5 MG TABS tablet Take 1 tablet (5 mg total) by mouth 2 (two) times daily. Qty: 60 tablet, Refills: 2    diltiazem (CARDIZEM CD) 240 MG 24 hr capsule Take 1 capsule (240 mg total) by mouth daily. Qty: 30 capsule, Refills: 2    metoprolol tartrate (LOPRESSOR) 25 MG tablet Take 1 tablet (25 mg total) by mouth 2 (two) times daily. Qty: 60 tablet, Refills: 2      CONTINUE these medications which have NOT CHANGED   Details  tamsulosin (FLOMAX) 0.4 MG CAPS capsule Take 1 capsule by mouth daily.        If you experience worsening of your admission symptoms, develop shortness of breath, life threatening emergency, suicidal or homicidal thoughts you must seek medical attention immediately by calling 911 or calling your MD immediately  if symptoms less severe.  You Must read complete instructions/literature along with all the possible adverse reactions/side effects for all the Medicines you take and that have been prescribed to you. Take any new Medicines after you have completely understood and accept all the possible adverse reactions/side effects.   Please note  You were cared for by a hospitalist during your hospital stay. If you have any questions about your discharge medications or the care you received while you were in the hospital after you are discharged, you can call the unit and asked to speak with the hospitalist on call if the hospitalist that took care of you is not available. Once you are discharged, your primary care physician will handle  any further medical issues. Please note that NO REFILLS for any discharge medications will be authorized once you are discharged, as it is imperative that you return to your primary care physician (or establish a relationship with a primary care physician if you do not have one) for your aftercare needs so that they can reassess your need for medications and monitor your lab values. Today    SUBJECTIVE   No new complaints  VITAL SIGNS:  Blood pressure 102/66, pulse (!) 57, temperature 97.7 F (36.5 C), temperature source Oral, resp. rate 14, height 6\' 3"  (1.905 m), weight 111.8 kg (246 lb 6.4 oz), SpO2 99 %.  I/O:   Intake/Output Summary (Last 24 hours) at 11/22/16 1223 Last data filed at 11/22/16 0800  Gross per 24 hour  Intake              720 ml  Output             2175 ml  Net            -1455 ml    PHYSICAL EXAMINATION:  GENERAL:  66 y.o.-year-old patient lying in the bed with no acute distress.  EYES: Pupils equal, round, reactive to light and accommodation. No scleral icterus. Extraocular muscles intact.  HEENT: Head atraumatic, normocephalic. Oropharynx and nasopharynx clear.  NECK:  Supple, no jugular venous distention. No thyroid enlargement, no tenderness.  LUNGS: Normal breath sounds bilaterally, no wheezing, rales,rhonchi or crepitation. No use of accessory muscles of respiration.  CARDIOVASCULAR: S1, S2 normal. No murmurs, rubs, or gallops.  ABDOMEN: Soft, non-tender, non-distended. Bowel sounds present. No organomegaly or mass.  EXTREMITIES: No pedal edema, cyanosis, or clubbing.  NEUROLOGIC: Cranial nerves II through XII are intact. Muscle strength 5/5 in all extremities. Sensation intact. Gait not checked.  PSYCHIATRIC: The patient is alert and oriented x 3.  SKIN: No obvious rash, lesion, or ulcer.   DATA REVIEW:   CBC   Recent Labs Lab 11/20/16 2258  WBC 5.0  HGB 13.0  HCT 37.9*  PLT 191    Chemistries   Recent Labs Lab 11/20/16 2258 11/21/16 0612  NA 138 139  K 3.8 3.6  CL 108 110  CO2 23 23  GLUCOSE 132* 102*  BUN 18 12  CREATININE 0.92 0.81  CALCIUM 8.9 8.6*  MG 2.0  --     Microbiology Results   No results found for this or any previous visit (from the past 240 hour(s)).  RADIOLOGY:  Dg Chest 2 View  Result Date: 11/20/2016 CLINICAL DATA:  Acute onset of tachycardia. Pounding heart rate. Initial encounter.  EXAM: CHEST  2 VIEW COMPARISON:  None. FINDINGS: The lungs are well-aerated and clear. There is no evidence of focal opacification, pleural effusion or pneumothorax. The heart is normal in size; the mediastinal contour is within normal limits. No acute osseous abnormalities are seen. IMPRESSION: No acute cardiopulmonary process seen. Electronically Signed   By: Garald Balding M.D.   On: 11/20/2016 23:47     Management plans discussed with the patient, family and they are in agreement.  CODE STATUS:     Code Status Orders        Start     Ordered   11/21/16 0536  Full code  Continuous     11/21/16 0535    Code Status History    Date Active Date Inactive Code Status Order ID Comments User Context   This patient has a current code status but no historical code status.  TOTAL TIME TAKING CARE OF THIS PATIENT: 40 minutes.    Jalyn Rosero M.D on 11/22/2016 at 12:23 PM  Between 7am to 6pm - Pager - [phone] After 6pm go to [email or web address] - password [email or web address] [place] [facility]  Office  [phone]  CC: Primary care physician; [facility], Chrissie Noa, MD

## 2016-11-22 NOTE — Progress Notes (Signed)
DR PATEL and  DR PARASCHOS where  made aware of pt HR  in the 170's with activity and stated that he felt sob and dizzy during the episode, bp of 97/69 ,no new order at this time ,will continue to monitor.

## 2016-11-22 NOTE — Progress Notes (Signed)
[facility] Cardiology  SUBJECTIVE: I feel better   Vitals:   11/21/16 1119 11/21/16 1917 11/22/16 0348 11/22/16 0829  BP: 104/67 110/80 (!) 94/57 92/63  Pulse: 74 69 91 74  Resp: 20 18 18 16   Temp: 97.7 F (36.5 C) 97.6 F (36.4 C) 98.1 F (36.7 C) 97.5 F (36.4 C)  TempSrc: Oral Oral Oral Oral  SpO2: 99% 98% 98% 98%  Weight:      Height:         Intake/Output Summary (Last 24 hours) at 11/22/16 1043 Last data filed at 11/22/16 0800  Gross per 24 hour  Intake              720 ml  Output             2425 ml  Net            -1705 ml      PHYSICAL EXAM  General: Well developed, well nourished, in no acute distress HEENT:  Normocephalic and atramatic Neck:  No JVD.  Lungs: Clear bilaterally to auscultation and percussion. Heart: HRRR . Normal S1 and S2 without gallops or murmurs.  Abdomen: Bowel sounds are positive, abdomen soft and non-tender  Msk:  Back normal, normal gait. Normal strength and tone for age. Extremities: No clubbing, cyanosis or edema.   Neuro: Alert and oriented X 3. Psych:  Good affect, responds appropriately   LABS: Basic Metabolic Panel:  Recent Labs  11/20/16 2258 11/21/16 0612  NA 138 139  K 3.8 3.6  CL 108 110  CO2 23 23  GLUCOSE 132* 102*  BUN 18 12  CREATININE 0.92 0.81  CALCIUM 8.9 8.6*  MG 2.0  --    Liver Function Tests: No results for input(s): AST, ALT, ALKPHOS, BILITOT, PROT, ALBUMIN in the last 72 hours. No results for input(s): LIPASE, AMYLASE in the last 72 hours. CBC:  Recent Labs  11/20/16 2258  WBC 5.0  HGB 13.0  HCT 37.9*  MCV 94.0  PLT 191   Cardiac Enzymes:  Recent Labs  11/21/16 0612 11/21/16 1200 11/21/16 1726  TROPONINI <0.03 <0.03 <0.03   BNP: Invalid input(s): POCBNP D-Dimer: No results for input(s): DDIMER in the last 72 hours. Hemoglobin A1C: No results for input(s): HGBA1C in the last 72 hours. Fasting Lipid Panel:  Recent Labs  11/21/16 0612  CHOL 143  HDL 34*  LDLCALC 96  TRIG  66  CHOLHDL 4.2   Thyroid Function Tests: No results for input(s): TSH, T4TOTAL, T3FREE, THYROIDAB in the last 72 hours.  Invalid input(s): FREET3 Anemia Panel: No results for input(s): VITAMINB12, FOLATE, FERRITIN, TIBC, IRON, RETICCTPCT in the last 72 hours.  Dg Chest 2 View  Result Date: 11/20/2016 CLINICAL DATA:  Acute onset of tachycardia. Pounding heart rate. Initial encounter. EXAM: CHEST  2 VIEW COMPARISON:  None. FINDINGS: The lungs are well-aerated and clear. There is no evidence of focal opacification, pleural effusion or pneumothorax. The heart is normal in size; the mediastinal contour is within normal limits. No acute osseous abnormalities are seen. IMPRESSION: No acute cardiopulmonary process seen. Electronically Signed   By: Garald Balding M.D.   On: 11/20/2016 23:47     Echo pending  TELEMETRY: Atrial fibrillation:  ASSESSMENT AND PLAN:  Principal Problem:   A-fib ([place])    1. Atrial fibrillation with a rapid ventricular rate, heart rate ranges 90 bpm at rest to 140 with exertion, chads Vasc of 1, on Eliquis for stroke prevention  Recommendations  1. Start  Cardizem CD 240 mg daily 2. DC diltiazem 3. Continue Eliquis for stroke prevention 4. Add metoprolol titrate 25 mg twice a day 5. Review 2-D echocardiogram   Isaias Cowman, MD, PhD, [facility] 11/22/2016 10:43 AM

## 2016-11-22 NOTE — Care Management Note (Signed)
Case Management Note  Patient Details  Name: Joshua Barajas MRN: [id number] Date of Birth: 05/31/1951  Subjective/Objective:        [name] reports that he already has an Eliquis coupon. No other discharge needs identified.             Action/Plan:   Expected Discharge Date:  11/22/16               Expected Discharge Plan:     In-House Referral:     Discharge planning Services     Post Acute Care Choice:    Choice offered to:     DME Arranged:    DME Agency:     HH Arranged:    HH Agency:     Status of Service:     If discussed at [employer], dates discussed:    Additional Comments:  Byrd Terrero A, RN 11/22/2016, 12:56 PM

## 2016-11-26 DIAGNOSIS — Z87891 Personal history of nicotine dependence: Secondary | ICD-10-CM | POA: Diagnosis not present

## 2016-11-26 DIAGNOSIS — Z136 Encounter for screening for cardiovascular disorders: Secondary | ICD-10-CM | POA: Diagnosis not present

## 2016-11-27 DIAGNOSIS — I4891 Unspecified atrial fibrillation: Secondary | ICD-10-CM | POA: Diagnosis not present

## 2016-11-27 DIAGNOSIS — R0789 Other chest pain: Secondary | ICD-10-CM | POA: Diagnosis not present

## 2016-11-27 DIAGNOSIS — I480 Paroxysmal atrial fibrillation: Secondary | ICD-10-CM | POA: Diagnosis not present

## 2016-11-27 DIAGNOSIS — E7801 Familial hypercholesterolemia: Secondary | ICD-10-CM | POA: Diagnosis not present

## 2016-11-28 DIAGNOSIS — Z961 Presence of intraocular lens: Secondary | ICD-10-CM | POA: Diagnosis not present

## 2016-12-02 DIAGNOSIS — I480 Paroxysmal atrial fibrillation: Secondary | ICD-10-CM | POA: Diagnosis not present

## 2016-12-04 DIAGNOSIS — I4891 Unspecified atrial fibrillation: Secondary | ICD-10-CM | POA: Diagnosis not present

## 2016-12-10 DIAGNOSIS — I4891 Unspecified atrial fibrillation: Secondary | ICD-10-CM | POA: Diagnosis not present

## 2016-12-10 DIAGNOSIS — I480 Paroxysmal atrial fibrillation: Secondary | ICD-10-CM | POA: Diagnosis not present

## 2016-12-10 DIAGNOSIS — E7801 Familial hypercholesterolemia: Secondary | ICD-10-CM | POA: Diagnosis not present

## 2016-12-17 DIAGNOSIS — I4891 Unspecified atrial fibrillation: Secondary | ICD-10-CM | POA: Diagnosis not present

## 2017-01-01 DIAGNOSIS — Z85828 Personal history of other malignant neoplasm of skin: Secondary | ICD-10-CM | POA: Diagnosis not present

## 2017-01-01 DIAGNOSIS — D225 Melanocytic nevi of trunk: Secondary | ICD-10-CM | POA: Diagnosis not present

## 2017-01-01 DIAGNOSIS — L570 Actinic keratosis: Secondary | ICD-10-CM | POA: Diagnosis not present

## 2017-01-01 DIAGNOSIS — D2261 Melanocytic nevi of right upper limb, including shoulder: Secondary | ICD-10-CM | POA: Diagnosis not present

## 2017-01-06 DIAGNOSIS — E7801 Familial hypercholesterolemia: Secondary | ICD-10-CM | POA: Diagnosis not present

## 2017-01-06 DIAGNOSIS — I480 Paroxysmal atrial fibrillation: Secondary | ICD-10-CM | POA: Diagnosis not present

## 2017-01-06 DIAGNOSIS — I4891 Unspecified atrial fibrillation: Secondary | ICD-10-CM | POA: Diagnosis not present

## 2017-01-20 DIAGNOSIS — L309 Dermatitis, unspecified: Secondary | ICD-10-CM | POA: Diagnosis not present

## 2017-04-09 DIAGNOSIS — E7801 Familial hypercholesterolemia: Secondary | ICD-10-CM | POA: Diagnosis not present

## 2017-04-09 DIAGNOSIS — I4891 Unspecified atrial fibrillation: Secondary | ICD-10-CM | POA: Diagnosis not present

## 2017-04-09 DIAGNOSIS — I480 Paroxysmal atrial fibrillation: Secondary | ICD-10-CM | POA: Diagnosis not present

## 2017-05-01 DIAGNOSIS — Z510 Encounter for antineoplastic radiation therapy: Secondary | ICD-10-CM | POA: Diagnosis not present

## 2017-05-01 DIAGNOSIS — C61 Malignant neoplasm of prostate: Secondary | ICD-10-CM | POA: Diagnosis not present

## 2017-09-15 DIAGNOSIS — Z85828 Personal history of other malignant neoplasm of skin: Secondary | ICD-10-CM | POA: Diagnosis not present

## 2017-09-15 DIAGNOSIS — L570 Actinic keratosis: Secondary | ICD-10-CM | POA: Diagnosis not present

## 2017-09-15 DIAGNOSIS — D2261 Melanocytic nevi of right upper limb, including shoulder: Secondary | ICD-10-CM | POA: Diagnosis not present

## 2017-09-15 DIAGNOSIS — X32XXXA Exposure to sunlight, initial encounter: Secondary | ICD-10-CM | POA: Diagnosis not present

## 2017-09-15 DIAGNOSIS — L821 Other seborrheic keratosis: Secondary | ICD-10-CM | POA: Diagnosis not present

## 2017-09-15 DIAGNOSIS — D225 Melanocytic nevi of trunk: Secondary | ICD-10-CM | POA: Diagnosis not present

## 2017-10-16 DIAGNOSIS — I480 Paroxysmal atrial fibrillation: Secondary | ICD-10-CM | POA: Diagnosis not present

## 2017-10-16 DIAGNOSIS — I4891 Unspecified atrial fibrillation: Secondary | ICD-10-CM | POA: Diagnosis not present

## 2017-10-16 DIAGNOSIS — E7801 Familial hypercholesterolemia: Secondary | ICD-10-CM | POA: Diagnosis not present

## 2017-11-03 DIAGNOSIS — Z0000 Encounter for general adult medical examination without abnormal findings: Secondary | ICD-10-CM | POA: Diagnosis not present

## 2017-11-03 DIAGNOSIS — I480 Paroxysmal atrial fibrillation: Secondary | ICD-10-CM | POA: Diagnosis not present

## 2017-11-03 DIAGNOSIS — C61 Malignant neoplasm of prostate: Secondary | ICD-10-CM | POA: Diagnosis not present

## 2017-11-06 DIAGNOSIS — C61 Malignant neoplasm of prostate: Secondary | ICD-10-CM | POA: Diagnosis not present

## 2018-04-21 DIAGNOSIS — I4891 Unspecified atrial fibrillation: Secondary | ICD-10-CM | POA: Diagnosis not present

## 2018-04-21 DIAGNOSIS — E7801 Familial hypercholesterolemia: Secondary | ICD-10-CM | POA: Diagnosis not present

## 2018-04-21 DIAGNOSIS — I480 Paroxysmal atrial fibrillation: Secondary | ICD-10-CM | POA: Diagnosis not present

## 2018-05-04 ENCOUNTER — Ambulatory Visit (INDEPENDENT_AMBULATORY_CARE_PROVIDER_SITE_OTHER): Payer: PPO | Admitting: Urology

## 2018-05-04 ENCOUNTER — Encounter: Payer: Self-pay | Admitting: Urology

## 2018-05-04 VITALS — BP 115/73 | HR 60

## 2018-05-04 DIAGNOSIS — C61 Malignant neoplasm of prostate: Secondary | ICD-10-CM | POA: Diagnosis not present

## 2018-05-04 DIAGNOSIS — Z8546 Personal history of malignant neoplasm of prostate: Secondary | ICD-10-CM

## 2018-05-04 DIAGNOSIS — R351 Nocturia: Secondary | ICD-10-CM

## 2018-05-04 LAB — BLADDER SCAN AMB NON-IMAGING

## 2018-05-04 MED ORDER — TAMSULOSIN HCL 0.4 MG PO CAPS: 0.4000 mg | ORAL_CAPSULE | Freq: Every day | ORAL | 11 refills | Status: DC

## 2018-05-04 MED ORDER — TAMSULOSIN HCL 0.4 MG PO CAPS: 0.4000 mg | ORAL_CAPSULE | Freq: Every day | ORAL | 0 refills | Status: DC

## 2018-05-04 NOTE — Progress Notes (Signed)
05/04/2018 11:15 AM   Joshua Barajas Nov 25, 1950 [id number]  Referring provider: [name], [address] [place], [place] [address]  Chief Complaint  Patient presents with  . Prostate Cancer    Establish care    HPI: 67 year old male with a personal history of prostate cancer who returns today to reestablish care.  He was last seen just over 3 years ago.  His personal history of prostate cancer diagnosed in 11/2014.  Patient underwent prostate biopsy for an accelerating PSA velocity most recently to 3.2. Initial prostate biopsy 2/16 revealed Gleason 3+3 prostate cancer involving 6 of 12 cores bilaterally, 4 on the LEFT (lateral base, lateral mid, and lateral apex) and 2 on the RIGHT (base up to 83% tissue). His prostate exam was fairly unremarkable, diffusely firm. TRUS vol 26 cc.    Repeat prostate biopsy on 04/13/15 showed Gleason 3+3 in 6/12 cores, 4 on the right and 2 on the left this time involving up on 33% at the left apex but otherwise farily low volume diease.  Repeat PSA on 04/14/15 3.4.    He ultimately transferred his care to [facility] and elected to undergo CyberKnife radiation (36 Gy completed 10/09/2015).  His insurance no longer covers his care is [facility] and would like to continue to have his care here.  Most recent PSA 11/16/2017 < 0.1.    He does have nocturia x q1 hour for the past 3 years.  He does have LE edema.  These are relatively small volumes.  He is not sure if he snores.   Never evaluated for sleep apnea.    Minimal daytime urinary symptoms.  He was previously started on Flomax by his radiation oncologist and takes it intermittently as needed.  He reports that when he takes this medication, his flow is somewhat better but overall, is not particular bothered by his symptoms.  He uses medication intermittently.   PMH: Past Medical History:  Diagnosis Date  . Dysrhythmia   . ED (erectile dysfunction)   . Hematuria   . HLD  (hyperlipidemia)   . Over weight   . Prostate cancer ([place])   . Rising PSA level   . Squamous cell carcinoma     Surgical History: Past Surgical History:  Procedure Laterality Date  . CATARACT EXTRACTION Bilateral   . SEPTOPLASTY    . TONSILECTOMY, ADENOIDECTOMY, BILATERAL MYRINGOTOMY AND TUBES      Home Medications:  Allergies as of 05/04/2018      Reactions   Latex Rash   Other Rash   If leaves on for few days      Medication List        Accurate as of 05/04/18 11:15 AM. Always use your most recent med list.          apixaban 5 MG Tabs tablet Commonly known as:  ELIQUIS Take 1 tablet (5 mg total) by mouth 2 (two) times daily.   diltiazem 240 MG 24 hr capsule Commonly known as:  CARDIZEM CD Take 1 capsule (240 mg total) by mouth daily.   metoprolol tartrate 25 MG tablet Commonly known as:  LOPRESSOR Take 1 tablet (25 mg total) by mouth 2 (two) times daily.   tamsulosin 0.4 MG Caps capsule Commonly known as:  FLOMAX Take 1 capsule (0.4 mg total) by mouth daily.       Allergies:  Allergies  Allergen Reactions  . Latex Rash  . Other Rash    If leaves on for few  days    Family History: Family History  Problem Relation Age of Onset  . Heart attack Father   . Rheum arthritis Father   . Heart disease Paternal Uncle     Social History:  reports that he quit smoking about 42 years ago. He has never used smokeless tobacco. He reports that he does not drink alcohol or use drugs.  ROS: UROLOGY Frequent Urination?: No Hard to postpone urination?: No Burning/pain with urination?: No Get up at night to urinate?: No Leakage of urine?: No Urine stream starts and stops?: No Trouble starting stream?: No Do you have to strain to urinate?: No Blood in urine?: No Urinary tract infection?: No Sexually transmitted disease?: No Injury to kidneys or bladder?: No Painful intercourse?: No Weak stream?: No Erection problems?: No Penile pain?:  No  Gastrointestinal Nausea?: No Vomiting?: No Indigestion/heartburn?: No Diarrhea?: No Constipation?: No  Constitutional Fever: No Night sweats?: No Weight loss?: No Fatigue?: No  Skin Skin rash/lesions?: No Itching?: No  Eyes Blurred vision?: No Double vision?: No  Ears/Nose/Throat Sore throat?: No Sinus problems?: No  Hematologic/Lymphatic Swollen glands?: No Easy bruising?: Yes  Cardiovascular Leg swelling?: Yes Chest pain?: No  Respiratory Cough?: No Shortness of breath?: No  Endocrine Excessive thirst?: No  Musculoskeletal Back pain?: No Joint pain?: No  Neurological Headaches?: No Dizziness?: No  Psychologic Depression?: No Anxiety?: No  Physical Exam: BP 115/73   Pulse 60   Constitutional:  Alert and oriented, No acute distress. HEENT: [place] AT, moist mucus membranes.  Trachea midline, no masses. Cardiovascular: Bilateral lower extremity edema appreciated as long with, right greater than left which per the patient is chronic Respiratory: Normal respiratory effort, no increased work of breathing. Skin: No rashes, bruises or suspicious lesions. Neurologic: Grossly intact, no focal deficits, moving all 4 extremities. Psychiatric: Normal mood and affect.  Laboratory Data: Lab Results  Component Value Date   WBC 5.0 11/20/2016   HGB 13.0 11/20/2016   HCT 37.9 (L) 11/20/2016   MCV 94.0 11/20/2016   PLT 191 11/20/2016    Lab Results  Component Value Date   CREATININE 0.81 11/21/2016    Urinalysis N/a  Pertinent Imaging: PVR 72cc  Assessment & Plan:    1. History of prostate cancer History of low risk prostate cancer diagnosed in 2006 CyberKnife therapy 8 by [facility] Continue every 6 months PSA PSA drawn today Plan for labs in 6 months and MD visit in 1 year with PSA - PSA  2. Nocturia Chronic daytime urinary symptoms as frequently as every hour He reports that he is retired he is not as bothered by this I suspect this  is likely multifactorial including personal history of lower extremity edema, as well as possibly underlying undiagnosed untreated sleep apnea I have urged him to discuss this further with his primary care physician, Dr. Ellison Hughs At this point time, he is not interested in any pharmacotherapy We will manage conservatively at this time   Return in about 1 year (around 05/05/2019) for 6 mo PSA lab only, MD visit in 1 year for PSA. or sooner as needed  Hollice Espy, MD  [facility] [address] [place], [place] [address] [phone]

## 2018-05-04 NOTE — Addendum Note (Signed)
Addended by: Tommy Rainwater on: 05/04/2018 11:22 AM   Modules accepted: Orders

## 2018-05-05 LAB — PSA

## 2018-07-16 DIAGNOSIS — R300 Dysuria: Secondary | ICD-10-CM | POA: Diagnosis not present

## 2018-07-16 DIAGNOSIS — N390 Urinary tract infection, site not specified: Secondary | ICD-10-CM | POA: Diagnosis not present

## 2018-07-19 ENCOUNTER — Other Ambulatory Visit: Payer: Self-pay

## 2018-07-19 NOTE — Patient Outreach (Signed)
[facility]) Care Management  07/19/2018  Joshua Barajas 03-27-1951 [id number]  Nurse Call Line Referral Date: 07/16/18 Reason for Referral: Urinary symptoms Nurse call line recommendation: See doctor within 24 hours Attempt #1  Telephone call to patient regarding referral. Unable to reach patient. HIPAA compliant voice message left with call back phone number.   PLAN: RNCM will attempt 2nd telephone call to patient within 4 business days. RNCM will send outreach letter.   Quinn Plowman RN,BSN, [place] Telephonic  [phone]

## 2018-07-20 ENCOUNTER — Other Ambulatory Visit: Payer: Self-pay

## 2018-07-20 NOTE — Patient Outreach (Signed)
[facility]) Care Management  07/20/2018  BRENNYN HAISLEY 1951/01/29 [id number]  Nurse Call Line Referral Date: 07/16/18 Reason for Referral: Urinary symptoms Nurse call line recommendation: See doctor within 24 hours  Telephone call to patient regarding Nurse call line referral. HIPAA verified. Explained reason for call. Patient states he saw his doctor as recommended.  He states his doctor gave him a prescription for an antibiotic that he takes for approximately 5 days. Patient states he is taking the medication currently. Patient states he is scheduled for a follow up visit with his doctor on 08/02/18.  Patient states his doctor informed him if the symptoms do not resolve with the antibiotic treatment he will be referred to a urologist.  RNCM advised patient to take his course of antibiotics completely and report any new symptoms or concerns to his doctor. Patient verbalized understanding.  Patient denies any further concerns or needs.  RNCM advised patient to call 24 hour nurse line as needed.   PLAN: RNCM will close patient due to patient being assessed and having no further needs.  RNCM will send closure notification to patients primary MD.   Quinn Plowman RN,BSN,CCM [facility] Telephonic  [phone]

## 2018-08-02 DIAGNOSIS — N390 Urinary tract infection, site not specified: Secondary | ICD-10-CM | POA: Diagnosis not present

## 2018-09-14 DIAGNOSIS — D485 Neoplasm of uncertain behavior of skin: Secondary | ICD-10-CM | POA: Diagnosis not present

## 2018-09-14 DIAGNOSIS — D2271 Melanocytic nevi of right lower limb, including hip: Secondary | ICD-10-CM | POA: Diagnosis not present

## 2018-09-14 DIAGNOSIS — Z08 Encounter for follow-up examination after completed treatment for malignant neoplasm: Secondary | ICD-10-CM | POA: Diagnosis not present

## 2018-09-14 DIAGNOSIS — L821 Other seborrheic keratosis: Secondary | ICD-10-CM | POA: Diagnosis not present

## 2018-09-14 DIAGNOSIS — D2272 Melanocytic nevi of left lower limb, including hip: Secondary | ICD-10-CM | POA: Diagnosis not present

## 2018-09-14 DIAGNOSIS — L570 Actinic keratosis: Secondary | ICD-10-CM | POA: Diagnosis not present

## 2018-09-14 DIAGNOSIS — D2262 Melanocytic nevi of left upper limb, including shoulder: Secondary | ICD-10-CM | POA: Diagnosis not present

## 2018-09-14 DIAGNOSIS — D225 Melanocytic nevi of trunk: Secondary | ICD-10-CM | POA: Diagnosis not present

## 2018-09-14 DIAGNOSIS — D2261 Melanocytic nevi of right upper limb, including shoulder: Secondary | ICD-10-CM | POA: Diagnosis not present

## 2018-09-14 DIAGNOSIS — Z85828 Personal history of other malignant neoplasm of skin: Secondary | ICD-10-CM | POA: Diagnosis not present

## 2018-10-08 ENCOUNTER — Emergency Department: Payer: PPO

## 2018-10-08 ENCOUNTER — Emergency Department
Admission: EM | Admit: 2018-10-08 | Discharge: 2018-10-08 | Disposition: A | Discharge status: Home / Self Care | Payer: PPO | Attending: Emergency Medicine | Admitting: Emergency Medicine

## 2018-10-08 ENCOUNTER — Other Ambulatory Visit: Payer: Self-pay

## 2018-10-08 DIAGNOSIS — Z7901 Long term (current) use of anticoagulants: Secondary | ICD-10-CM | POA: Insufficient documentation

## 2018-10-08 DIAGNOSIS — Z91040 Latex allergy status: Secondary | ICD-10-CM | POA: Insufficient documentation

## 2018-10-08 DIAGNOSIS — W230XXA Caught, crushed, jammed, or pinched between moving objects, initial encounter: Secondary | ICD-10-CM | POA: Diagnosis not present

## 2018-10-08 DIAGNOSIS — Y998 Other external cause status: Secondary | ICD-10-CM | POA: Diagnosis not present

## 2018-10-08 DIAGNOSIS — Z79899 Other long term (current) drug therapy: Secondary | ICD-10-CM | POA: Diagnosis not present

## 2018-10-08 DIAGNOSIS — S61412A Laceration without foreign body of left hand, initial encounter: Secondary | ICD-10-CM | POA: Insufficient documentation

## 2018-10-08 DIAGNOSIS — Z87891 Personal history of nicotine dependence: Secondary | ICD-10-CM | POA: Insufficient documentation

## 2018-10-08 DIAGNOSIS — Y9389 Activity, other specified: Secondary | ICD-10-CM | POA: Insufficient documentation

## 2018-10-08 DIAGNOSIS — Y929 Unspecified place or not applicable: Secondary | ICD-10-CM | POA: Insufficient documentation

## 2018-10-08 DIAGNOSIS — S6992XA Unspecified injury of left wrist, hand and finger(s), initial encounter: Secondary | ICD-10-CM | POA: Diagnosis present

## 2018-10-08 DIAGNOSIS — Z8546 Personal history of malignant neoplasm of prostate: Secondary | ICD-10-CM | POA: Diagnosis not present

## 2018-10-08 DIAGNOSIS — S62625A Displaced fracture of medial phalanx of left ring finger, initial encounter for closed fracture: Secondary | ICD-10-CM | POA: Diagnosis not present

## 2018-10-08 MED ORDER — LIDOCAINE HCL (PF) 1 % IJ SOLN
10.0000 mL | Freq: Once | INTRAMUSCULAR | Status: AC
  Administered 2018-10-08: 10 mL
  Filled 2018-10-08: qty 10

## 2018-10-08 MED ORDER — CEPHALEXIN 500 MG PO CAPS: 500.0000 mg | ORAL_CAPSULE | Freq: Three times a day (TID) | ORAL | 0 refills | Status: DC

## 2018-10-08 NOTE — Discharge Instructions (Signed)
Follow-up with Dr. Rudene Christians please call his office for an appointment for next week, tell them you were seen in the emergency department and we talked with him.  You need a recheck due to the extensive wound and tendon function.  Keep the areas clean and dry as possible.  You may remove the splint to wash the area daily.  If you are unable to extend your finger she will need to follow-up with him as soon as possible.  Call his office and let them know you are unable to extend the finger so they will get you in immediately.  Return to the emergency department if any other problems.

## 2018-10-08 NOTE — ED Provider Notes (Signed)
[facility] Emergency Department Provider Note  ____________________________________________   First MD Initiated Contact with Patient 10/08/18 1257     (approximate)  I have reviewed the triage vital signs and the nursing notes.   HISTORY  Chief Complaint Extremity Laceration    HPI Joshua Barajas is a 67 y.o. male since emergency department with a laceration to the left hand.  He states he was trying to change a cable under the golf cart and it started to roll and the cable caught his hand.  He has a large laceration on the back of the hand.  He states he is able to move all of his fingers.  He states his MyChart states that his tetanus is due in 2021.    Past Medical History:  Diagnosis Date  . Dysrhythmia   . ED (erectile dysfunction)   . Hematuria   . HLD (hyperlipidemia)   . Over weight   . Prostate cancer ([place])   . Rising PSA level   . Squamous cell carcinoma     Patient Active Problem List   Diagnosis Date Noted  . A-fib ([place]) 11/21/2016  . CA of prostate ([place]) 01/08/2015  . HLD (hyperlipidemia) 04/13/2014  . 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 ED (erectile dysfunction) of organic origin 04/05/2014    Past Surgical History:  Procedure Laterality Date  . CATARACT EXTRACTION Bilateral   . SEPTOPLASTY    . TONSILECTOMY, ADENOIDECTOMY, BILATERAL MYRINGOTOMY AND TUBES      Prior to Admission medications   Medication Sig Start Date End Date Taking? Authorizing Provider  apixaban (ELIQUIS) 5 MG TABS tablet Take 1 tablet (5 mg total) by mouth 2 (two) times daily. 11/22/16   Fritzi Mandes, MD  cephALEXin (KEFLEX) 500 MG capsule Take 1 capsule (500 mg total) by mouth 3 (three) times daily. 10/08/18   Fisher, Linden Dolin, PA-C  diltiazem (CARDIZEM CD) 240 MG 24 hr capsule Take 1 capsule (240 mg total) by mouth daily. 11/23/16   Fritzi Mandes, MD  metoprolol tartrate (LOPRESSOR) 25 MG tablet Take 1 tablet (25 mg total) by mouth 2 (two) times  daily. 11/22/16   Fritzi Mandes, MD  tamsulosin (FLOMAX) 0.4 MG CAPS capsule Take 1 capsule (0.4 mg total) by mouth daily. 05/04/18   Hollice Espy, MD    Allergies Latex and Other  Family History  Problem Relation Age of Onset  . Heart attack Father   . Rheum arthritis Father   . Heart disease Paternal Uncle     Social History Social History   Tobacco Use  . Smoking status: Former Smoker    Last attempt to quit: 04/10/1976    Years since quitting: 42.5  . Smokeless tobacco: Never Used  Substance Use Topics  . Alcohol use: No  . Drug use: No    Review of Systems  Constitutional: No fever/chills Eyes: No visual changes. ENT: No sore throat. Respiratory: Denies cough Genitourinary: Negative for dysuria. Musculoskeletal: Negative for back pain.  Positive for left hand laceration Skin: Negative for rash.  As of laceration    ____________________________________________   PHYSICAL EXAM:  VITAL SIGNS: ED Triage Vitals  Enc Vitals Group     BP 10/08/18 1259 108/65     Pulse Rate 10/08/18 1259 (!) 59     Resp 10/08/18 1259 18     Temp 10/08/18 1259 98 F (36.7 C)     Temp Source 10/08/18 1259 Oral     SpO2 10/08/18 1259 100 %  Weight 10/08/18 1300 275 lb (124.7 kg)     Height 10/08/18 1300 6\' 3"  (1.905 m)     Head Circumference --      Peak Flow --      Pain Score 10/08/18 1300 8     Pain Loc --      Pain Edu? --      Excl. in [place]? --     Constitutional: Alert and oriented. Well appearing and in no acute distress. Eyes: Conjunctivae are normal.  Head: Atraumatic. Nose: No congestion/rhinnorhea. Mouth/Throat: Mucous membranes are moist.   Neck:  supple no lymphadenopathy noted Cardiovascular: Normal rate, regular rhythm. Respiratory: Normal respiratory effort.  No retractions GU: deferred Musculoskeletal: FROM all extremities, warm and well perfused, full range of motion of all fingers.  There is a large laceration noted across the dorsum of the left hand  ranging from the left fourth metacarpal to the webspace of the thumb.  No foreign body is noted.  Tendons were visualized but appear to be intact.  Neurovascular is intact Neurologic:  Normal speech and language.  Skin:  Skin is warm, dry . No rash noted. Psychiatric: Mood and affect are normal. Speech and behavior are normal.  ____________________________________________   LABS (all labs ordered are listed, but only abnormal results are displayed)  Labs Reviewed - No data to display ____________________________________________   ____________________________________________  RADIOLOGY  X-ray of the left hand is negative for fracture  ____________________________________________   PROCEDURES  Procedure(s) performed:   Marland KitchenMarland KitchenLaceration Repair Date/Time: 10/08/2018 2:29 PM Performed by: Versie Starks, PA-C Authorized by: Versie Starks, PA-C   Consent:    Consent obtained:  Verbal   Consent given by:  Patient   Risks discussed:  Infection, pain, poor cosmetic result, poor wound healing, tendon damage and retained foreign body   Alternatives discussed:  Referral Anesthesia (see MAR for exact dosages):    Anesthesia method:  Local infiltration   Local anesthetic:  Lidocaine 1% w/o epi Laceration details:    Location:  Hand   Hand location:  L hand, dorsum   Length (cm):  6   Depth (mm):  3 Repair type:    Repair type:  Intermediate Pre-procedure details:    Preparation:  Patient was prepped and draped in usual sterile fashion and imaging obtained to evaluate for foreign bodies Exploration:    Hemostasis achieved with:  Direct pressure   Wound exploration: wound explored through full range of motion     Wound extent: no foreign bodies/material noted, no muscle damage noted, no tendon damage noted and no underlying fracture noted     Contaminated: no   Treatment:    Area cleansed with:  Betadine and saline   Amount of cleaning:  Extensive   Irrigation solution:   Sterile saline   Irrigation method:  Pressure wash, syringe and tap Skin repair:    Repair method:  Sutures   Suture size:  5-0   Suture material:  Nylon   Suture technique:  Simple interrupted   Number of sutures:  22 Approximation:    Approximation:  Close Post-procedure details:    Dressing:  Non-adherent dressing and splint for protection   Patient tolerance of procedure:  Tolerated well, no immediate complications      ____________________________________________   INITIAL IMPRESSION / ASSESSMENT AND PLAN / ED COURSE  Pertinent labs & imaging results that were available during my care of the patient were reviewed by me and considered in my medical decision making (  see chart for details).   Patient is a 67 year old male presents emergency department complaint of laceration to the left hand.  Physical exam shows a jagged laceration across the dorsum of the left hand.  Tendons are exposed but appear to be intact.  He has full range of motion of his fingers. See procedure note for repair.  Page Dr. Rudene Christians.  He states to splint the patient and have him follow-up in the office.  Patient is to call and be seen next week.  Most likely Thursday.  He was given a prescription for Keflex 500 3 times daily.  He is to return to emergency department if any difficulty with the sutures.  Call Dr. Rudene Christians and be seen immediately if he is unable to extend his fingers.  He states he understands and will comply.  He was discharged in stable condition.     As part of my medical decision making, I reviewed the following data within the [facility] notes reviewed and incorporated, Old chart reviewed, Radiograph reviewed x-ray left hand is negative, A consult was requested and obtained from this/these consultant(s) Orthopedics, Notes from prior ED visits and  Controlled Substance Database  ____________________________________________   FINAL CLINICAL IMPRESSION(S) / ED  DIAGNOSES  Final diagnoses:  Laceration of left hand without foreign body, initial encounter      NEW MEDICATIONS STARTED DURING THIS VISIT:  New Prescriptions   CEPHALEXIN (KEFLEX) 500 MG CAPSULE    Take 1 capsule (500 mg total) by mouth 3 (three) times daily.     Note:  This document was prepared using Dragon voice recognition software and may include unintentional dictation errors.    Versie Starks, PA-C 10/08/18 1441    Lavonia Drafts, MD 10/08/18 [phone]

## 2018-10-08 NOTE — ED Triage Notes (Signed)
Pt arrived via POV with left hand laceration after trying to change cable under golf cart. Laceration small and bleeding but pt NAD at present.

## 2018-10-11 ENCOUNTER — Ambulatory Visit
Admission: EM | Admit: 2018-10-11 | Discharge: 2018-10-11 | Disposition: A | Discharge status: Home / Self Care | Payer: PPO | Attending: Family Medicine | Admitting: Family Medicine

## 2018-10-11 ENCOUNTER — Encounter: Payer: Self-pay | Admitting: Emergency Medicine

## 2018-10-11 ENCOUNTER — Other Ambulatory Visit: Payer: Self-pay

## 2018-10-11 DIAGNOSIS — S61412A Laceration without foreign body of left hand, initial encounter: Secondary | ICD-10-CM

## 2018-10-11 DIAGNOSIS — W268XXA Contact with other sharp object(s), not elsewhere classified, initial encounter: Secondary | ICD-10-CM | POA: Diagnosis not present

## 2018-10-11 DIAGNOSIS — M79642 Pain in left hand: Secondary | ICD-10-CM | POA: Diagnosis not present

## 2018-10-11 DIAGNOSIS — Z5189 Encounter for other specified aftercare: Secondary | ICD-10-CM | POA: Insufficient documentation

## 2018-10-11 DIAGNOSIS — S6992XD Unspecified injury of left wrist, hand and finger(s), subsequent encounter: Secondary | ICD-10-CM

## 2018-10-11 MED ORDER — MUPIROCIN 2 % EX OINT: TOPICAL_OINTMENT | CUTANEOUS | 0 refills | Status: DC

## 2018-10-11 NOTE — ED Provider Notes (Signed)
[facility] ____________________________________________  Time seen: Approximately 1:50 PM  I have reviewed the triage vital signs and the nursing notes.   HISTORY  Chief Complaint Hand Injury (left)  HPI Joshua Barajas is a 67 y.o. male presenting for reevaluation of extensive left hand laceration that was repaired in the ER 2 days ago.  Patient sustained a wound to left hand after a cord pulled across his hand causing laceration.  Seen in emergency room had wound repaired and has been taking Keflex as prescribed 3 times a day.  Patient has called to set up appointment for follow-up with orthopedic but has not yet heard back for when the appointment will be.  Reports up-to-date on his tetanus immunization.  States he is here today as he has some swelling to his hand and wanted to make sure it is not infected.  Denies any purulent drainage, fever.  States no pain to his hand at this time.  States he has good strength but is unable to fully make a fist.  Denies paresthesias.  Reports right-hand dominant.  Reports otherwise doing well denies other complaints.  Joshua Hartigan, MD: PCP    Past Medical History:  Diagnosis Date  . Dysrhythmia   . ED (erectile dysfunction)   . Hematuria   . HLD (hyperlipidemia)   . Over weight   . Prostate cancer ([place])   . Rising PSA level   . Squamous cell carcinoma     Patient Active Problem List   Diagnosis Date Noted  . A-fib ([place]) 11/21/2016  . CA of prostate ([place]) 01/08/2015  . HLD (hyperlipidemia) 04/13/2014  . 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 ED (erectile dysfunction) of organic origin 04/05/2014    Past Surgical History:  Procedure Laterality Date  . CATARACT EXTRACTION Bilateral   . SEPTOPLASTY    . TONSILECTOMY, ADENOIDECTOMY, BILATERAL MYRINGOTOMY AND TUBES       No current facility-administered medications for this encounter.   Current Outpatient Medications:  .  apixaban (ELIQUIS) 5 MG TABS tablet,  Take 1 tablet (5 mg total) by mouth 2 (two) times daily., Disp: 60 tablet, Rfl: 2 .  cephALEXin (KEFLEX) 500 MG capsule, Take 1 capsule (500 mg total) by mouth 3 (three) times daily., Disp: 21 capsule, Rfl: 0 .  diltiazem (CARDIZEM CD) 240 MG 24 hr capsule, Take 1 capsule (240 mg total) by mouth daily., Disp: 30 capsule, Rfl: 2 .  metoprolol tartrate (LOPRESSOR) 25 MG tablet, Take 1 tablet (25 mg total) by mouth 2 (two) times daily., Disp: 60 tablet, Rfl: 2 .  tamsulosin (FLOMAX) 0.4 MG CAPS capsule, Take 1 capsule (0.4 mg total) by mouth daily., Disp: 30 capsule, Rfl: 11 .  mupirocin ointment (BACTROBAN) 2 %, Apply two times a day for 7 days., Disp: 22 g, Rfl: 0  Allergies Latex and Other  Family History  Problem Relation Age of Onset  . Heart attack Father   . Rheum arthritis Father   . Heart disease Paternal Uncle     Social History Social History   Tobacco Use  . Smoking status: Former Smoker    Last attempt to quit: 04/10/1976    Years since quitting: 42.5  . Smokeless tobacco: Never Used  Substance Use Topics  . Alcohol use: No  . Drug use: No    Review of Systems Constitutional: No fever Cardiovascular: Denies chest pain. Respiratory: Denies shortness of breath. Gastrointestinal: No abdominal pain.   Musculoskeletal: Negative for back pain. Skin:as above.  ____________________________________________   PHYSICAL EXAM:  VITAL SIGNS: ED Triage Vitals  Enc Vitals Group     BP 10/11/18 1144 119/75     Pulse Rate 10/11/18 1144 (!) 53     Resp 10/11/18 1144 18     Temp 10/11/18 1144 97.7 F (36.5 C)     Temp Source 10/11/18 1144 Oral     SpO2 10/11/18 1144 99 %     Weight 10/11/18 1141 275 lb (124.7 kg)     Height 10/11/18 1141 6\' 3"  (1.905 m)     Head Circumference --      Peak Flow --      Pain Score 10/11/18 1141 1     Pain Loc --      Pain Edu? --      Excl. in [place]? --     Constitutional: Alert and oriented. Well appearing and in no acute  distress. ENT      Head: Normocephalic and atraumatic. Cardiovascular: Normal rate, regular rhythm. Grossly normal heart sounds.  Good peripheral circulation. Respiratory: Normal respiratory effort without tachypnea nor retractions. Breath sounds are clear and equal bilaterally. No wheezes, rales, rhonchi. Musculoskeletal:  Bilateral distal radial pulses equal.  Except:     Wound as above to dorsal left hand. Minimal bleeding. No purulence or drainage. Minimal tenderness sutured wound site. No other left hand tenderness. Ecchymosis and mild edema as depicted. All fingers with good resisted flexion and extension, no motor or tendon deficit noted.  Patient is unable to fully make left fist with edema at MCP.  Left index distal finger, and all fingers nontender to palpation. Neurologic:  Normal speech and language. No gross focal neurologic deficits are appreciated. Speech is normal. No gait instability.  Skin:  Skin is warm, dry .  As above. Psychiatric: Mood and affect are normal. Speech and behavior are normal. Patient exhibits appropriate insight and judgment   ___________________________________________   LABS (all labs ordered are listed, but only abnormal results are displayed)  Labs Reviewed - No data to display ____________________________________________  RADIOLOGY  EXAM: LEFT HAND - COMPLETE 3+ VIEW  COMPARISON:  None.  FINDINGS: Tiny avulsion fracture of the ulnar aspect of head of second middle phalanx. No other fracture or dislocation identified.  IMPRESSION: Tiny avulsion fracture of the ulnar aspect of head of second middle phalanx.   Electronically Signed   By: Kristine Garbe M.D.   On: 10/08/2018 13:33  Reviewed x-ray as above from radiologist from 12/23/21019.   PROCEDURES Procedures    INITIAL IMPRESSION / ASSESSMENT AND PLAN / ED COURSE  Pertinent labs & imaging results that were available during my care of the patient were reviewed  by me and considered in my medical decision making (see chart for details).  Well-appearing patient.  Wound reevaluation as above.  Left hand x-ray reviewed from ER visit and per radiologist noting tiny avulsion fracture of the second middle phalanx, patient stated he was unaware of this and on exam he is nontender here, suspect old injury.  Patient verbalized understanding.  He has been using a splint intermittently when active.  Recommend continue Keflex.  Appearance more consistent with wound inflammation, doubt active secondary infection.  Will also recommend Bactroban as well for added protection.  Continue wound care, supportive care and discussed to make sure he follows up with orthopedic as directed from ER.Discussed indication, risks and benefits of medications with patient.  Elevation and ice.  Discussed follow up with Primary care physician this week.  Discussed follow up and return parameters including no resolution or any worsening concerns. Patient verbalized understanding and agreed to plan.   ____________________________________________   FINAL CLINICAL IMPRESSION(S) / ED DIAGNOSES  Final diagnoses:  Visit for wound check  Hand injury, left, subsequent encounter     ED Discharge Orders         Ordered    mupirocin ointment (BACTROBAN) 2 %     10/11/18 1219           Note: This dictation was prepared with Dragon dictation along with smaller phrase technology. Any transcriptional errors that result from this process are unintentional.         Marylene Land, NP 10/11/18 1359

## 2018-10-11 NOTE — ED Triage Notes (Signed)
Pt had a hand injury/laceration on 10/08/18 and has 22 stitches. He is concerned for infection due to having swelling, and redness. Pt is taking Eliquis.

## 2018-10-11 NOTE — Discharge Instructions (Signed)
Continue oral antibiotic.  Use topical antibiotic as discussed.  Keep clean with soap and water.  Allow open air time.  Elevate and apply ice.  Continue to monitor.  As discussed, follow-up with orthopedic this week as directed by emergency room.  Follow up with your primary care physician this week as needed. Return to Urgent care for new or worsening concerns.

## 2018-10-19 DIAGNOSIS — S61412A Laceration without foreign body of left hand, initial encounter: Secondary | ICD-10-CM | POA: Diagnosis not present

## 2018-10-22 DIAGNOSIS — S61412A Laceration without foreign body of left hand, initial encounter: Secondary | ICD-10-CM | POA: Diagnosis not present

## 2018-11-02 ENCOUNTER — Other Ambulatory Visit: Payer: Self-pay

## 2018-11-02 DIAGNOSIS — Z8546 Personal history of malignant neoplasm of prostate: Secondary | ICD-10-CM

## 2018-11-03 ENCOUNTER — Other Ambulatory Visit: Payer: PPO

## 2018-11-03 DIAGNOSIS — Z8546 Personal history of malignant neoplasm of prostate: Secondary | ICD-10-CM

## 2018-11-04 ENCOUNTER — Telehealth: Payer: Self-pay

## 2018-11-04 DIAGNOSIS — C61 Malignant neoplasm of prostate: Secondary | ICD-10-CM | POA: Diagnosis not present

## 2018-11-04 DIAGNOSIS — R0683 Snoring: Secondary | ICD-10-CM | POA: Diagnosis not present

## 2018-11-04 DIAGNOSIS — I480 Paroxysmal atrial fibrillation: Secondary | ICD-10-CM | POA: Diagnosis not present

## 2018-11-04 DIAGNOSIS — Z0000 Encounter for general adult medical examination without abnormal findings: Secondary | ICD-10-CM | POA: Diagnosis not present

## 2018-11-04 DIAGNOSIS — E7801 Familial hypercholesterolemia: Secondary | ICD-10-CM | POA: Diagnosis not present

## 2018-11-04 DIAGNOSIS — I471 Supraventricular tachycardia: Secondary | ICD-10-CM | POA: Diagnosis not present

## 2018-11-04 LAB — PSA

## 2018-11-04 NOTE — Telephone Encounter (Signed)
-----   Message from Hollice Espy, MD sent at 11/04/2018  7:58 AM EST ----- PSA undetectable.  Hollice Espy, MD

## 2018-11-05 DIAGNOSIS — Z0000 Encounter for general adult medical examination without abnormal findings: Secondary | ICD-10-CM | POA: Diagnosis not present

## 2018-11-05 DIAGNOSIS — I480 Paroxysmal atrial fibrillation: Secondary | ICD-10-CM | POA: Diagnosis not present

## 2018-11-05 DIAGNOSIS — E7801 Familial hypercholesterolemia: Secondary | ICD-10-CM | POA: Diagnosis not present

## 2018-11-15 DIAGNOSIS — G4733 Obstructive sleep apnea (adult) (pediatric): Secondary | ICD-10-CM | POA: Diagnosis not present

## 2018-11-29 DIAGNOSIS — G4733 Obstructive sleep apnea (adult) (pediatric): Secondary | ICD-10-CM | POA: Diagnosis not present

## 2019-01-06 DIAGNOSIS — G4733 Obstructive sleep apnea (adult) (pediatric): Secondary | ICD-10-CM | POA: Diagnosis not present

## 2019-01-15 IMAGING — CR DG CHEST 2V
1 series · 2 of 2 positions shown · non-contrast
Comparison: None.

CLINICAL DATA: Acute onset of tachycardia. Pounding heart rate.
Initial encounter.

EXAM:
CHEST  2 VIEW

[Series 1: dg chest 2 view · 0.14mm/px · 2 of 2 slices shown]
[im 1/2]
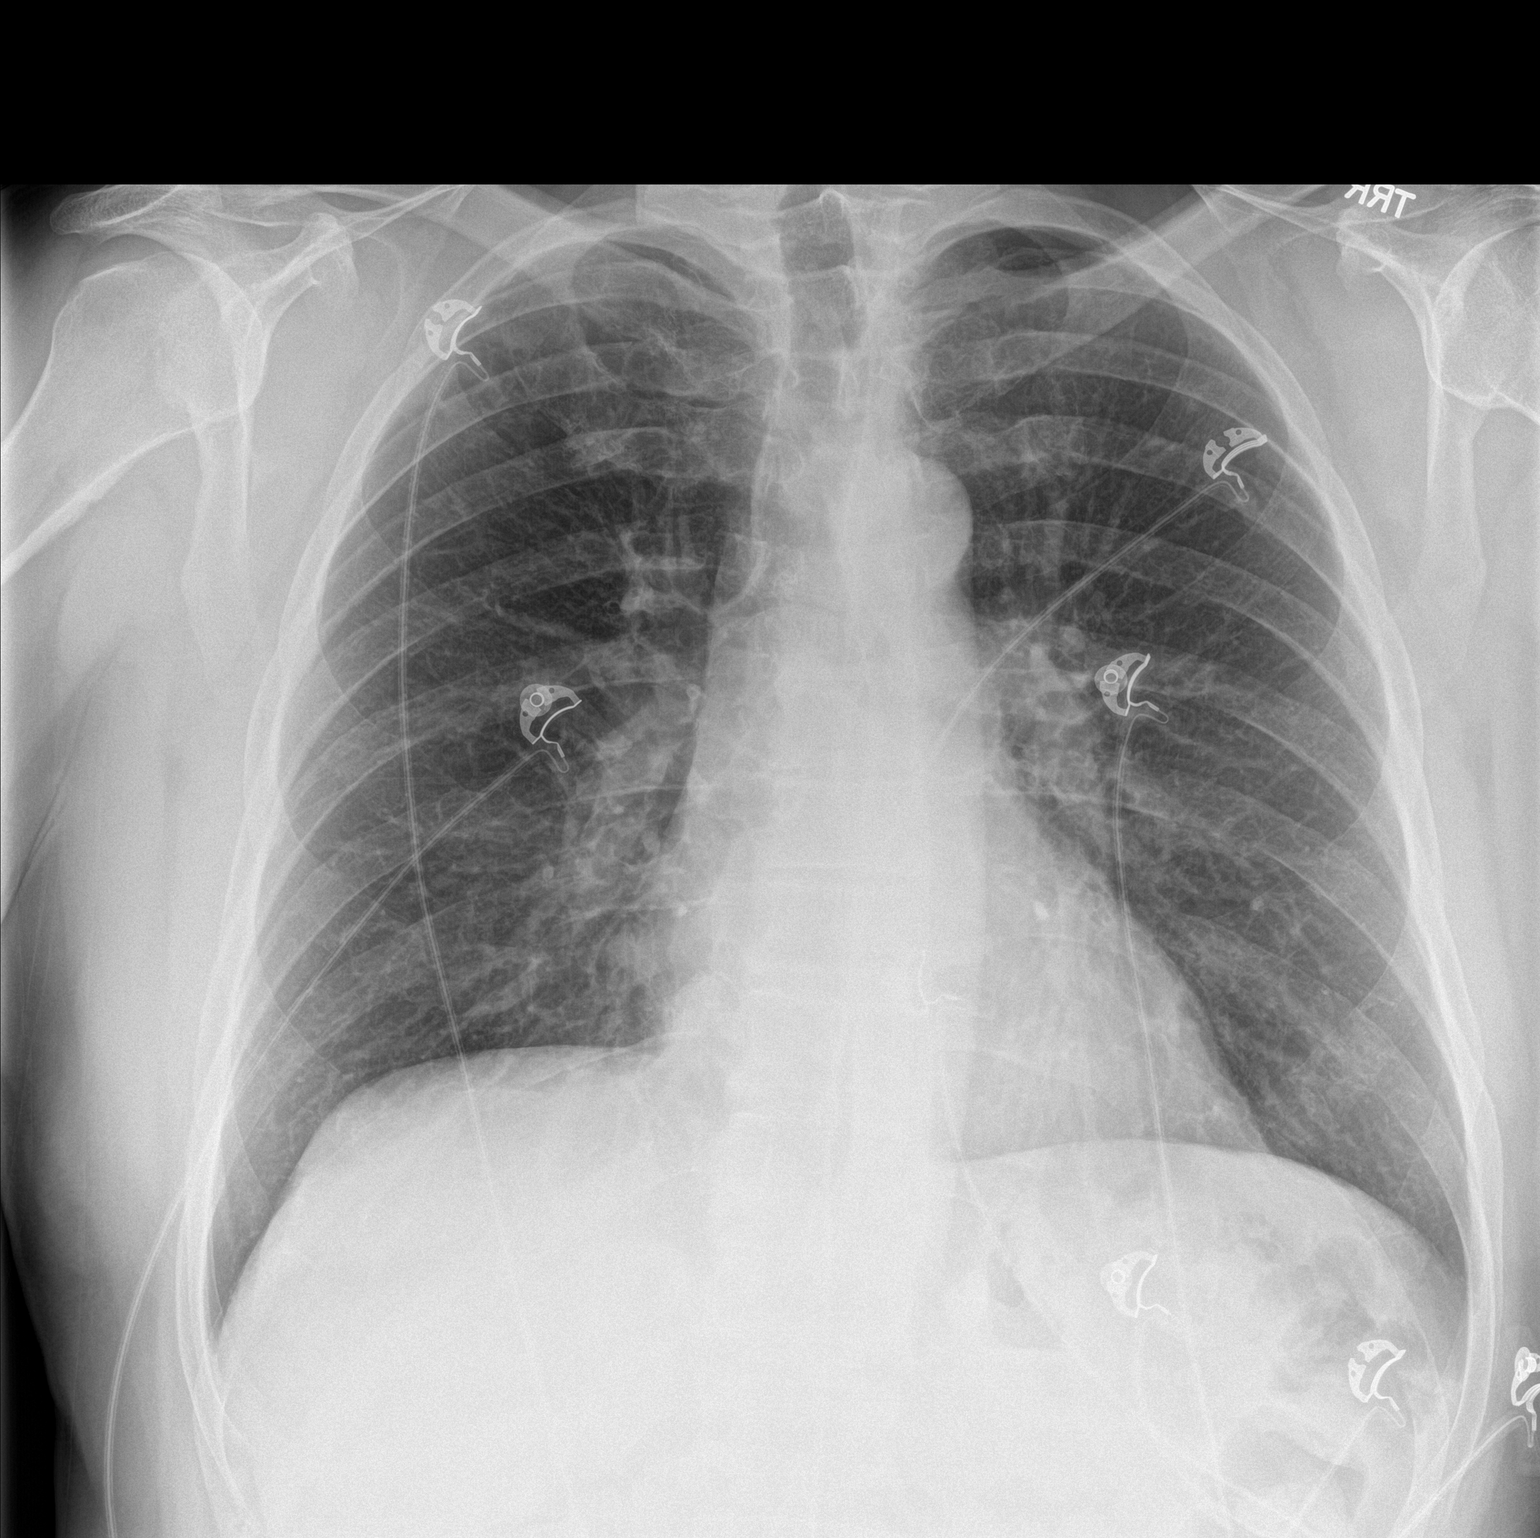
[im 2/2]
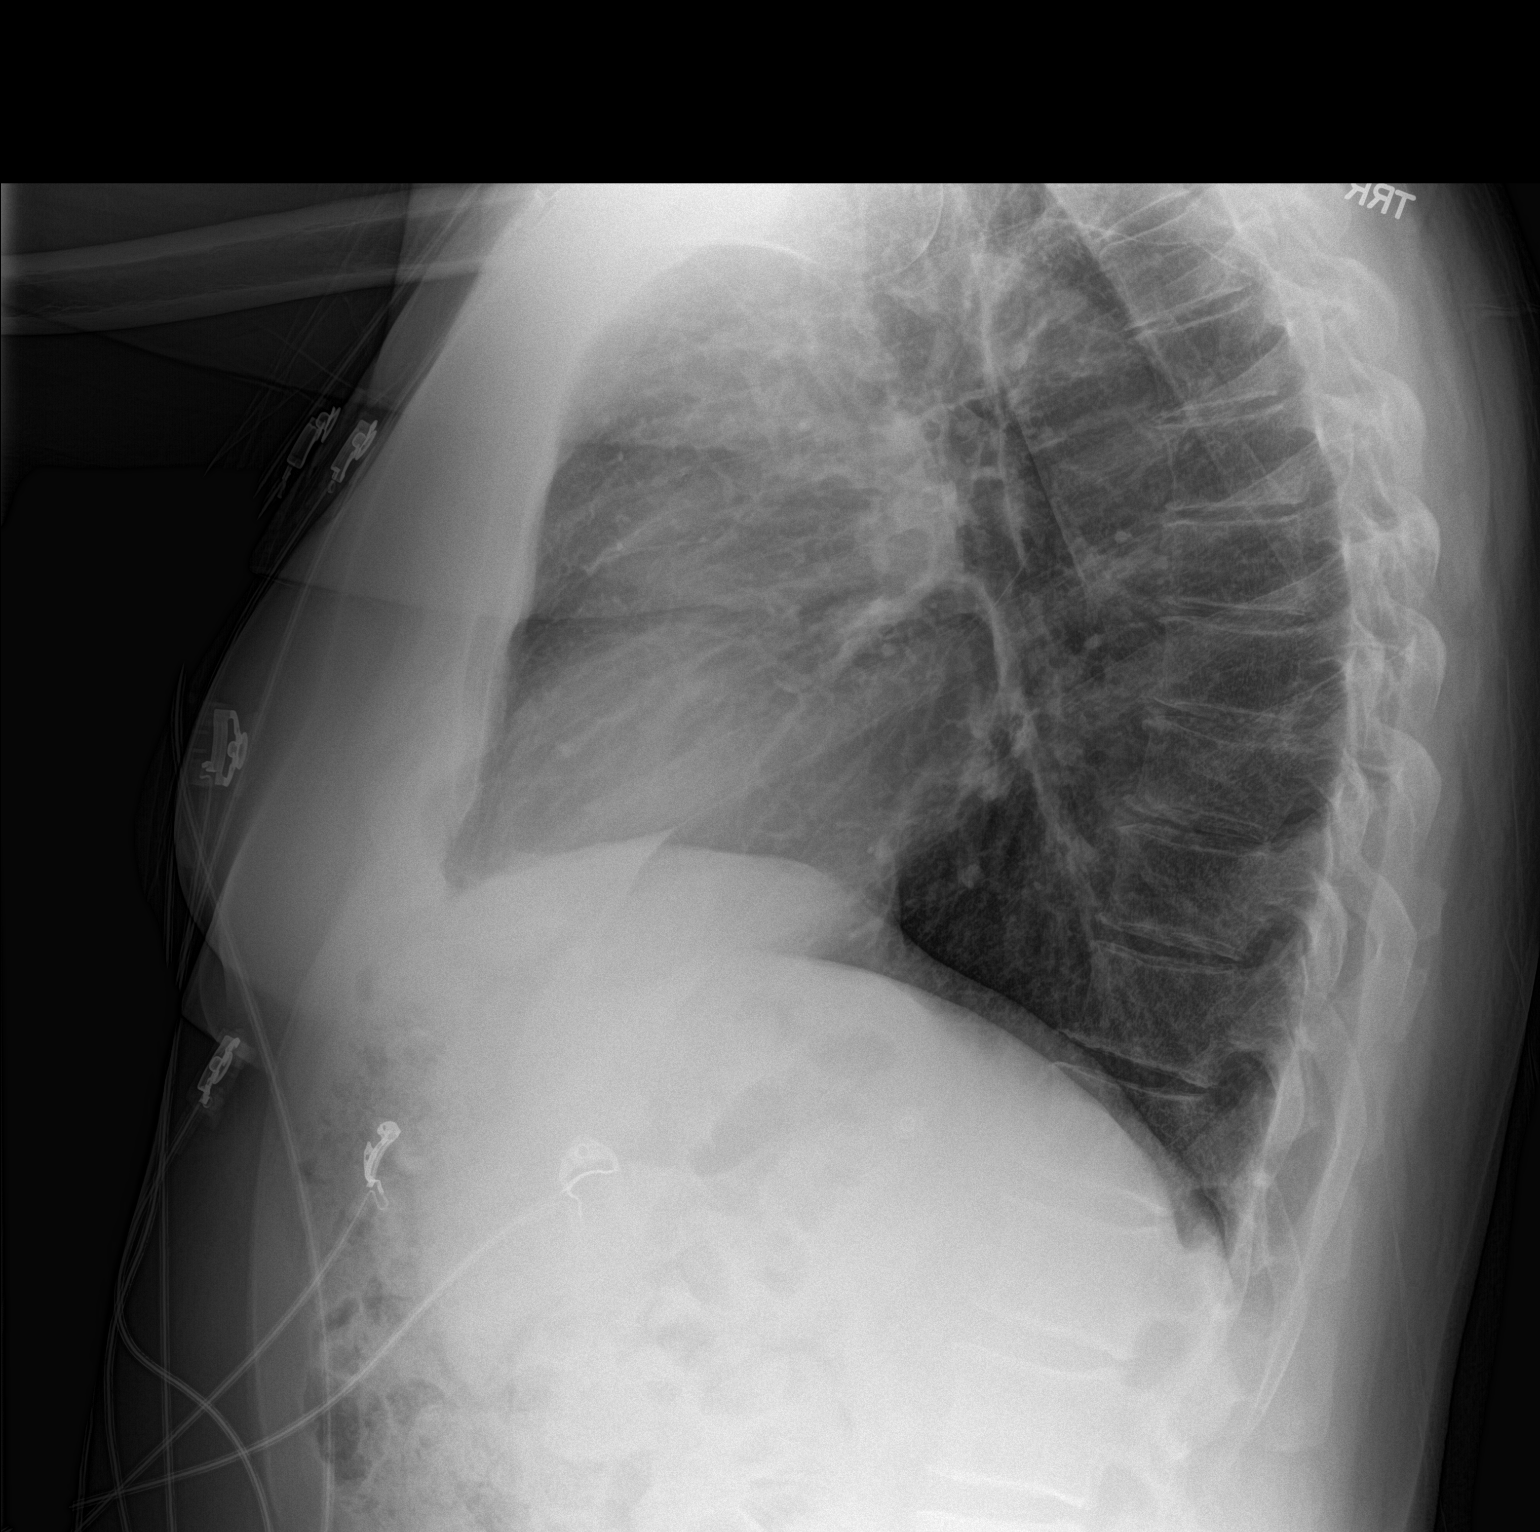

[2 of 2 positions shown; findings below may reference images not displayed]

FINDINGS: The lungs are well-aerated and clear. There is no evidence of focal
opacification, pleural effusion or pneumothorax.

The heart is normal in size; the mediastinal contour is within
normal limits. No acute osseous abnormalities are seen.
IMPRESSION: No acute cardiopulmonary process seen.

## 2019-02-06 DIAGNOSIS — G4733 Obstructive sleep apnea (adult) (pediatric): Secondary | ICD-10-CM | POA: Diagnosis not present

## 2019-03-08 DIAGNOSIS — G4733 Obstructive sleep apnea (adult) (pediatric): Secondary | ICD-10-CM | POA: Diagnosis not present

## 2019-04-04 DIAGNOSIS — G4733 Obstructive sleep apnea (adult) (pediatric): Secondary | ICD-10-CM | POA: Diagnosis not present

## 2019-04-04 DIAGNOSIS — E669 Obesity, unspecified: Secondary | ICD-10-CM | POA: Diagnosis not present

## 2019-04-04 DIAGNOSIS — I4891 Unspecified atrial fibrillation: Secondary | ICD-10-CM | POA: Diagnosis not present

## 2019-04-08 DIAGNOSIS — G4733 Obstructive sleep apnea (adult) (pediatric): Secondary | ICD-10-CM | POA: Diagnosis not present

## 2019-05-08 DIAGNOSIS — G4733 Obstructive sleep apnea (adult) (pediatric): Secondary | ICD-10-CM | POA: Diagnosis not present

## 2019-05-10 ENCOUNTER — Encounter: Payer: Self-pay | Admitting: Urology

## 2019-05-10 ENCOUNTER — Other Ambulatory Visit: Payer: Self-pay

## 2019-05-10 ENCOUNTER — Ambulatory Visit (INDEPENDENT_AMBULATORY_CARE_PROVIDER_SITE_OTHER): Payer: PPO | Admitting: Urology

## 2019-05-10 VITALS — BP 135/73 | HR 76 | Ht 75.0 in | Wt 269.0 lb

## 2019-05-10 DIAGNOSIS — R351 Nocturia: Secondary | ICD-10-CM

## 2019-05-10 DIAGNOSIS — Z8546 Personal history of malignant neoplasm of prostate: Secondary | ICD-10-CM | POA: Diagnosis not present

## 2019-05-10 NOTE — Progress Notes (Signed)
05/10/2019 11:16 AM   Joshua Barajas [birth year] [id number]  Referring provider: [name], MD [place],  [place] [address]  Chief Complaint  Patient presents with  . Prostate Cancer    HPI: 68 year old male who returns today for routine annual follow-up for history of prostate cancer.  Patient underwent prostate biopsy for an accelerating PSA velocity most recently to 3.2. Initial prostate biopsy 2/16 revealed Gleason 3+3 prostate cancer involving 6 of 12 cores bilaterally, 4 on the LEFT (lateral base, lateral mid, and lateral apex) and 2 on the RIGHT (base up to 83% tissue). His prostate exam was fairly unremarkable, diffusely firm. TRUS vol 26 cc.    Repeat prostate biopsy on 04/13/15 showed Gleason 3+3 in 6/12 cores, 4 on the right and 2 on the left this time involving up on 33% at the left apex but otherwise farily low volume diease. Repeat PSA on 04/14/15 3.4.   He ultimately transferred his care to [facility] and elected to undergo CyberKnife radiation (36 Gy completed 10/09/2015).  His insurance no longer covers his care is [facility] and would like to continue to have his care here.  Recent PSA on 10/2018 undetectable.  In terms of urinary symptoms, he continues to have nocturia about every 2 hours which is stable.  In the interim since last visit, he underwent a sleep study and had to have significant OSA.  He no worse CPAP at night which is helped him sleep more restfully but he still wakes up to void.  His urinary symptoms are unchanged.  He does continue to have lower extremity edema does not wear his compression hose.  His daytime symptoms are well controlled.  PMH: Past Medical History:  Diagnosis Date  . Dysrhythmia   . ED (erectile dysfunction)   . Hematuria   . HLD (hyperlipidemia)   . Over weight   . Prostate cancer ([place])   . Rising PSA level   . Squamous cell carcinoma     Surgical History: Past Surgical History:  Procedure  Laterality Date  . CATARACT EXTRACTION Bilateral   . SEPTOPLASTY    . TONSILECTOMY, ADENOIDECTOMY, BILATERAL MYRINGOTOMY AND TUBES      Home Medications:  Allergies as of 05/10/2019      Reactions   Latex Rash   Other Rash   If leaves on for few days      Medication List       Accurate as of May 10, 2019 11:16 AM. If you have any questions, ask your nurse or doctor.        STOP taking these medications   cephALEXin 500 MG capsule Commonly known as: KEFLEX Stopped by: Hollice Espy, MD     TAKE these medications   apixaban 5 MG Tabs tablet Commonly known as: ELIQUIS Take 1 tablet (5 mg total) by mouth 2 (two) times daily.   diltiazem 240 MG 24 hr capsule Commonly known as: CARDIZEM CD Take 1 capsule (240 mg total) by mouth daily.   metoprolol tartrate 25 MG tablet Commonly known as: LOPRESSOR Take 1 tablet (25 mg total) by mouth 2 (two) times daily.   mupirocin ointment 2 % Commonly known as: BACTROBAN Apply two times a day for 7 days.   tamsulosin 0.4 MG Caps capsule Commonly known as: Flomax Take 1 capsule (0.4 mg total) by mouth daily.       Allergies:  Allergies  Allergen Reactions  . Latex Rash  . Other Rash  If leaves on for few days    Family History: Family History  Problem Relation Age of Onset  . Heart attack Father   . Rheum arthritis Father   . Heart disease Paternal Uncle     Social History:  reports that he quit smoking about 43 years ago. He has never used smokeless tobacco. He reports that he does not drink alcohol or use drugs.  ROS: UROLOGY Frequent Urination?: No Hard to postpone urination?: No Burning/pain with urination?: No Get up at night to urinate?: No Leakage of urine?: No Urine stream starts and stops?: No Trouble starting stream?: No Do you have to strain to urinate?: No Blood in urine?: No Urinary tract infection?: No Sexually transmitted disease?: No Injury to kidneys or bladder?: No Painful  intercourse?: No Weak stream?: No Erection problems?: No Penile pain?: No  Gastrointestinal Nausea?: No Vomiting?: No Indigestion/heartburn?: No Diarrhea?: No Constipation?: No  Constitutional Fever: No Night sweats?: No Weight loss?: No Fatigue?: No  Skin Skin rash/lesions?: No Itching?: No  Eyes Blurred vision?: No Double vision?: No  Ears/Nose/Throat Sore throat?: No Sinus problems?: No  Hematologic/Lymphatic Swollen glands?: No Easy bruising?: No  Cardiovascular Leg swelling?: No Chest pain?: No  Respiratory Cough?: No Shortness of breath?: No  Endocrine Excessive thirst?: No  Musculoskeletal Back pain?: No Joint pain?: No  Neurological Headaches?: No Dizziness?: No  Psychologic Depression?: No Anxiety?: No  Physical Exam: BP 135/73   Pulse 76   Ht 6\' 3"  (1.905 m)   Wt 269 lb (122 kg)   BMI 33.62 kg/m   Constitutional:  Alert and oriented, No acute distress. HEENT: [place] AT, moist mucus membranes.  Trachea midline, no masses. Cardiovascular: No clubbing, cyanosis, or edema. Respiratory: Normal respiratory effort, no increased work of breathing. Skin: No rashes, bruises or suspicious lesions. Neurologic: Grossly intact, no focal deficits, moving all 4 extremities. Psychiatric: Normal mood and affect.  Laboratory Data: Lab Results  Component Value Date   WBC 5.0 11/20/2016   HGB 13.0 11/20/2016   HCT 37.9 (L) 11/20/2016   MCV 94.0 11/20/2016   PLT 191 11/20/2016    Lab Results  Component Value Date   CREATININE 0.81 11/21/2016    PSA today pending   Assessment & Plan:    1. History of prostate cancer PSA today pending We will follow on annual basis We will call tomorrow with his PSA results - PSA; Future - PSA  2. Nocturia Stable nighttime urinary symptoms despite compliance with CPAP for OSA Again, we discussed is likely multifactorial Discussed behavioral modification including consideration of compression hose  Overall, he is not that bothered, reports that he is retired and he can just sleep and if he happens to feel unrested in the morning We discussed trial of medications including consideration of anticholinergic versus Myrbetriq versus DDAVP for primary nighttime symptoms  At this point time, he like to defer pharmacotherapy but will consider in the future if his symptoms worsen or become more bothersome   Return in about 1 year (around 05/09/2020) for with PA for PSA.  Hollice Espy, MD  [facility] [address], [place], [place] [address] [phone]

## 2019-05-11 LAB — PSA: Prostate Specific Ag, Serum: <0.1 ng/mL (ref 0.0–4.0)

## 2019-06-08 DIAGNOSIS — G4733 Obstructive sleep apnea (adult) (pediatric): Secondary | ICD-10-CM | POA: Diagnosis not present

## 2019-07-09 DIAGNOSIS — G4733 Obstructive sleep apnea (adult) (pediatric): Secondary | ICD-10-CM | POA: Diagnosis not present

## 2019-08-08 DIAGNOSIS — I10 Essential (primary) hypertension: Secondary | ICD-10-CM | POA: Diagnosis not present

## 2019-08-08 DIAGNOSIS — I4891 Unspecified atrial fibrillation: Secondary | ICD-10-CM | POA: Diagnosis not present

## 2019-08-08 DIAGNOSIS — G4733 Obstructive sleep apnea (adult) (pediatric): Secondary | ICD-10-CM | POA: Diagnosis not present

## 2019-08-28 DIAGNOSIS — M79604 Pain in right leg: Secondary | ICD-10-CM | POA: Diagnosis not present

## 2019-08-28 DIAGNOSIS — M7989 Other specified soft tissue disorders: Secondary | ICD-10-CM | POA: Diagnosis not present

## 2019-09-08 DIAGNOSIS — G4733 Obstructive sleep apnea (adult) (pediatric): Secondary | ICD-10-CM | POA: Diagnosis not present

## 2019-09-20 DIAGNOSIS — Z85828 Personal history of other malignant neoplasm of skin: Secondary | ICD-10-CM | POA: Diagnosis not present

## 2019-09-20 DIAGNOSIS — D225 Melanocytic nevi of trunk: Secondary | ICD-10-CM | POA: Diagnosis not present

## 2019-09-20 DIAGNOSIS — L538 Other specified erythematous conditions: Secondary | ICD-10-CM | POA: Diagnosis not present

## 2019-09-20 DIAGNOSIS — D2272 Melanocytic nevi of left lower limb, including hip: Secondary | ICD-10-CM | POA: Diagnosis not present

## 2019-09-20 DIAGNOSIS — D2262 Melanocytic nevi of left upper limb, including shoulder: Secondary | ICD-10-CM | POA: Diagnosis not present

## 2019-09-20 DIAGNOSIS — L820 Inflamed seborrheic keratosis: Secondary | ICD-10-CM | POA: Diagnosis not present

## 2019-09-20 DIAGNOSIS — L570 Actinic keratosis: Secondary | ICD-10-CM | POA: Diagnosis not present

## 2019-09-20 DIAGNOSIS — R208 Other disturbances of skin sensation: Secondary | ICD-10-CM | POA: Diagnosis not present

## 2019-10-08 DIAGNOSIS — G4733 Obstructive sleep apnea (adult) (pediatric): Secondary | ICD-10-CM | POA: Diagnosis not present

## 2019-10-09 ENCOUNTER — Encounter: Payer: Self-pay | Admitting: Urology

## 2019-10-24 DIAGNOSIS — G4733 Obstructive sleep apnea (adult) (pediatric): Secondary | ICD-10-CM | POA: Diagnosis not present

## 2019-10-24 DIAGNOSIS — I480 Paroxysmal atrial fibrillation: Secondary | ICD-10-CM | POA: Diagnosis not present

## 2019-10-24 DIAGNOSIS — E7801 Familial hypercholesterolemia: Secondary | ICD-10-CM | POA: Diagnosis not present

## 2019-11-07 DIAGNOSIS — G4733 Obstructive sleep apnea (adult) (pediatric): Secondary | ICD-10-CM | POA: Diagnosis not present

## 2019-11-07 DIAGNOSIS — C61 Malignant neoplasm of prostate: Secondary | ICD-10-CM | POA: Diagnosis not present

## 2019-11-07 DIAGNOSIS — I480 Paroxysmal atrial fibrillation: Secondary | ICD-10-CM | POA: Diagnosis not present

## 2019-11-07 DIAGNOSIS — Z0000 Encounter for general adult medical examination without abnormal findings: Secondary | ICD-10-CM | POA: Diagnosis not present

## 2019-11-08 DIAGNOSIS — G4733 Obstructive sleep apnea (adult) (pediatric): Secondary | ICD-10-CM | POA: Diagnosis not present

## 2019-11-08 DIAGNOSIS — Z0000 Encounter for general adult medical examination without abnormal findings: Secondary | ICD-10-CM | POA: Diagnosis not present

## 2019-11-08 DIAGNOSIS — E7801 Familial hypercholesterolemia: Secondary | ICD-10-CM | POA: Diagnosis not present

## 2019-11-08 DIAGNOSIS — I480 Paroxysmal atrial fibrillation: Secondary | ICD-10-CM | POA: Diagnosis not present

## 2019-11-17 DIAGNOSIS — G4733 Obstructive sleep apnea (adult) (pediatric): Secondary | ICD-10-CM | POA: Diagnosis not present

## 2019-12-09 DIAGNOSIS — G4733 Obstructive sleep apnea (adult) (pediatric): Secondary | ICD-10-CM | POA: Diagnosis not present

## 2020-01-06 DIAGNOSIS — G4733 Obstructive sleep apnea (adult) (pediatric): Secondary | ICD-10-CM | POA: Diagnosis not present

## 2020-05-10 ENCOUNTER — Other Ambulatory Visit: Payer: Self-pay

## 2020-05-10 ENCOUNTER — Other Ambulatory Visit: Payer: Self-pay | Admitting: Family Medicine

## 2020-05-10 ENCOUNTER — Other Ambulatory Visit: Payer: PPO

## 2020-05-10 DIAGNOSIS — Z8546 Personal history of malignant neoplasm of prostate: Secondary | ICD-10-CM

## 2020-05-11 LAB — PSA: Prostate Specific Ag, Serum: <0.1 ng/mL (ref 0.0–4.0)

## 2020-05-14 ENCOUNTER — Telehealth: Payer: Self-pay

## 2020-05-14 NOTE — Progress Notes (Signed)
05/15/2020 1:29 PM   Joshua Barajas [birth year] [id number]  Referring provider: [name], [place],  [place] [address]  Chief Complaint  Patient presents with  . Prostate Cancer    HPI: 69 year old male who returns today for routine annual follow-up for history of prostate cancer.  Patient underwent prostate biopsy for an accelerating PSA velocity most recently to 3.2. Initial prostate biopsy 2/16 revealed Gleason 3+3 prostate cancer involving 6 of 12 cores bilaterally, 4 on the LEFT (lateral base, lateral mid, and lateral apex) and 2 on the RIGHT (base up to 83% tissue). His prostate exam was fairly unremarkable, diffusely firm. TRUS vol 26 cc.    Repeat prostate biopsy on 04/13/15 showed Gleason 3+3 in 6/12 cores, 4 on the right and 2 on the left this time involving up on 33% at the left apex but otherwise farily low volume diease. Repeat PSA on 04/14/15 3.4.   He ultimately transferred his care to [facility] and elected to undergo CyberKnife radiation (36 Gy completed 10/09/2015).  His insurance no longer covers his care is [facility] and would like to continue to have his care here.  Recent PSA on 04/2020 undetectable.  He continues to have nocturia about every 2 hours which is stable.  He is sleeping with his CPAP machine, but he is not wearing compression hose.  He is realistic and understands that his lower extremity edema is the main contributing factor to his nocturia.  Patient denies any modifying or aggravating factors.  Patient denies any gross hematuria, dysuria or suprapubic/flank pain.  Patient denies any fevers, chills, nausea or vomiting.    PMH: Past Medical History:  Diagnosis Date  . Dysrhythmia   . ED (erectile dysfunction)   . Hematuria   . HLD (hyperlipidemia)   . Over weight   . Prostate cancer ([place])   . Rising PSA level   . Squamous cell carcinoma     Surgical History: Past Surgical History:  Procedure Laterality Date  .  CATARACT EXTRACTION Bilateral   . SEPTOPLASTY    . TONSILECTOMY, ADENOIDECTOMY, BILATERAL MYRINGOTOMY AND TUBES      Home Medications:  Allergies as of 05/15/2020      Reactions   Latex Rash   Other Rash   If leaves on for few days      Medication List       Accurate as of May 15, 2020  1:29 PM. If you have any questions, ask your nurse or doctor.        STOP taking these medications   mupirocin ointment 2 % Commonly known as: BACTROBAN Stopped by: Zara Council, PA-C     TAKE these medications   apixaban 5 MG Tabs tablet Commonly known as: ELIQUIS Take 1 tablet (5 mg total) by mouth 2 (two) times daily.   diltiazem 240 MG 24 hr capsule Commonly known as: CARDIZEM CD Take 1 capsule (240 mg total) by mouth daily.   metoprolol tartrate 25 MG tablet Commonly known as: LOPRESSOR Take 1 tablet (25 mg total) by mouth 2 (two) times daily.   tamsulosin 0.4 MG Caps capsule Commonly known as: Flomax Take 1 capsule (0.4 mg total) by mouth daily.       Allergies:  Allergies  Allergen Reactions  . Latex Rash  . Other Rash    If leaves on for few days    Family History: Family History  Problem Relation Age of Onset  . Heart attack Father   .  Rheum arthritis Father   . Heart disease Paternal Uncle     Social History:  reports that he quit smoking about 44 years ago. He has never used smokeless tobacco. He reports that he does not drink alcohol and does not use drugs.  ROS: For pertinent review of systems please refer to history of present illness  Physical Exam: BP 111/66   Pulse 54   Ht 6\' 3"  (1.905 m)   Wt (!) 273 lb (123.8 kg)   BMI 34.12 kg/m   Constitutional:  Well nourished. Alert and oriented, No acute distress. HEENT: [place] AT, mask in place.  Trachea midline Cardiovascular: No clubbing, cyanosis, but significant lower extremity edema is noted.  Respiratory: Normal respiratory effort, no increased work of breathing. Neurologic: Grossly intact, no  focal deficits, moving all 4 extremities. Psychiatric: Normal mood and affect.  Laboratory Data: Lab Results  Component Value Date   WBC 5.0 11/20/2016   HGB 13.0 11/20/2016   HCT 37.9 (L) 11/20/2016   MCV 94.0 11/20/2016   PLT 191 11/20/2016    Lab Results  Component Value Date   CREATININE 0.81 11/21/2016  I have reviewed the labs.    Assessment & Plan:    1. Prostate cancer PSA remains undetectable We will follow on annual basis - PSA; Future  2. Nocturia Stable nighttime urinary symptoms despite compliance with CPAP for OSA He has good understanding that the lower extremity edema is the main contributor for the nocturia and at this point time, he like to defer pharmacotherapy but will consider in the future if his symptoms worsen or become more bothersome   Return in about 1 year (around 05/15/2021) for PSA and office visit .  Zara Council, PA-C  [facility] [address] [place], [place] [address] [phone]

## 2020-05-14 NOTE — Telephone Encounter (Signed)
PT aware of results

## 2020-05-14 NOTE — Telephone Encounter (Signed)
-----   Message from Hollice Espy, MD sent at 05/14/2020  4:10 PM EDT ----- PSA is undetectable.  Great news.  Hollice Espy, MD

## 2020-05-15 ENCOUNTER — Encounter: Payer: Self-pay | Admitting: Urology

## 2020-05-15 ENCOUNTER — Other Ambulatory Visit: Payer: Self-pay

## 2020-05-15 ENCOUNTER — Ambulatory Visit (INDEPENDENT_AMBULATORY_CARE_PROVIDER_SITE_OTHER): Payer: PPO | Admitting: Urology

## 2020-05-15 VITALS — BP 111/66 | HR 54 | Ht 75.0 in | Wt 273.0 lb

## 2020-05-15 DIAGNOSIS — R351 Nocturia: Secondary | ICD-10-CM

## 2020-05-15 DIAGNOSIS — C61 Malignant neoplasm of prostate: Secondary | ICD-10-CM

## 2020-05-15 NOTE — Progress Notes (Incomplete)
05/15/2020 1:30 PM   Joshua Barajas October 11, 1951 [id number]  Referring provider: [name], [place],  [place] [address]  Chief Complaint  Patient presents with  . Prostate Cancer    HPI: 69 year old male who returns today for routine annual follow-up for history of prostate cancer.  Patient underwent prostate biopsy for an accelerating PSA velocity most recently to 3.2. Initial prostate biopsy 2/16 revealed Gleason 3+3 prostate cancer involving 6 of 12 cores bilaterally, 4 on the LEFT (lateral base, lateral mid, and lateral apex) and 2 on the RIGHT (base up to 83% tissue). His prostate exam was fairly unremarkable, diffusely firm. TRUS vol 26 cc.    Repeat prostate biopsy on 04/13/15 showed Gleason 3+3 in 6/12 cores, 4 on the right and 2 on the left this time involving up on 33% at the left apex but otherwise farily low volume diease. Repeat PSA on 04/14/15 3.4.   He ultimately transferred his care to [facility] and elected to undergo CyberKnife radiation (36 Gy completed 10/09/2015).  His insurance no longer covers his care is [facility] and would like to continue to have his care here.  Recent PSA on 04/2020 undetectable.  He continues to have nocturia about every 2 hours which is stable.  He is sleeping with his CPAP machine, but he is not wearing compression hose.  He is realistic and understands that his lower extremity edema is the main contributing factor to his nocturia.  Patient denies any modifying or aggravating factors.  Patient denies any gross hematuria, dysuria or suprapubic/flank pain.  Patient denies any fevers, chills, nausea or vomiting.    PMH: Past Medical History:  Diagnosis Date  . Dysrhythmia   . ED (erectile dysfunction)   . Hematuria   . HLD (hyperlipidemia)   . Over weight   . Prostate cancer ([place])   . Rising PSA level   . Squamous cell carcinoma     Surgical History: Past Surgical History:  Procedure Laterality Date  .  CATARACT EXTRACTION Bilateral   . SEPTOPLASTY    . TONSILECTOMY, ADENOIDECTOMY, BILATERAL MYRINGOTOMY AND TUBES      Home Medications:  Allergies as of 05/15/2020      Reactions   Latex Rash   Other Rash   If leaves on for few days      Medication List       Accurate as of May 15, 2020  1:30 PM. If you have any questions, ask your nurse or doctor.        STOP taking these medications   mupirocin ointment 2 % Commonly known as: BACTROBAN Stopped by: Zara Council, PA-C     TAKE these medications   apixaban 5 MG Tabs tablet Commonly known as: ELIQUIS Take 1 tablet (5 mg total) by mouth 2 (two) times daily.   diltiazem 240 MG 24 hr capsule Commonly known as: CARDIZEM CD Take 1 capsule (240 mg total) by mouth daily.   metoprolol tartrate 25 MG tablet Commonly known as: LOPRESSOR Take 1 tablet (25 mg total) by mouth 2 (two) times daily.   tamsulosin 0.4 MG Caps capsule Commonly known as: Flomax Take 1 capsule (0.4 mg total) by mouth daily.       Allergies:  Allergies  Allergen Reactions  . Latex Rash  . Other Rash    If leaves on for few days    Family History: Family History  Problem Relation Age of Onset  . Heart attack Father   .  Rheum arthritis Father   . Heart disease Paternal Uncle     Social History:  reports that he quit smoking about 44 years ago. He has never used smokeless tobacco. He reports that he does not drink alcohol and does not use drugs.  ROS: For pertinent review of systems please refer to history of present illness  Physical Exam: BP 111/66   Pulse 54   Ht 6\' 3"  (1.905 m)   Wt (!) 273 lb (123.8 kg)   BMI 34.12 kg/m   Constitutional:  Well nourished. Alert and oriented, No acute distress. HEENT: [place] AT, mask in place.  Trachea midline Cardiovascular: No clubbing, cyanosis, but significant lower extremity edema is noted.  Respiratory: Normal respiratory effort, no increased work of breathing. Neurologic: Grossly intact, no  focal deficits, moving all 4 extremities. Psychiatric: Normal mood and affect.  Laboratory Data: Lab Results  Component Value Date   WBC 5.0 11/20/2016   HGB 13.0 11/20/2016   HCT 37.9 (L) 11/20/2016   MCV 94.0 11/20/2016   PLT 191 11/20/2016    Lab Results  Component Value Date   CREATININE 0.81 11/21/2016  I have reviewed the labs.    Assessment & Plan:    1. Prostate cancer PSA remains undetectable We will follow on annual basis - PSA; Future  2. Nocturia Stable nighttime urinary symptoms despite compliance with CPAP for OSA He has good understanding that the lower extremity edema is the main contributor for the nocturia and at this point time, he like to defer pharmacotherapy but will consider in the future if his symptoms worsen or become more bothersome   Return in about 1 year (around 05/15/2021) for PSA and office visit .  [facility] [address] [place], [place] [address] [phone]

## 2020-08-06 ENCOUNTER — Ambulatory Visit (INDEPENDENT_AMBULATORY_CARE_PROVIDER_SITE_OTHER): Payer: PPO | Admitting: Internal Medicine

## 2020-08-06 VITALS — BP 145/79 | HR 57 | Ht 75.0 in | Wt 249.0 lb

## 2020-08-06 DIAGNOSIS — G4733 Obstructive sleep apnea (adult) (pediatric): Secondary | ICD-10-CM

## 2020-08-06 DIAGNOSIS — I4891 Unspecified atrial fibrillation: Secondary | ICD-10-CM | POA: Diagnosis not present

## 2020-08-06 DIAGNOSIS — Z9989 Dependence on other enabling machines and devices: Secondary | ICD-10-CM | POA: Diagnosis not present

## 2020-08-06 DIAGNOSIS — Z7189 Other specified counseling: Secondary | ICD-10-CM

## 2020-08-06 NOTE — Patient Instructions (Signed)

## 2020-08-06 NOTE — Progress Notes (Signed)
[facility] [place], [place] [address]  Pulmonary Sleep Medicine   Office Visit Note  Patient Name: Joshua Barajas DOB: 08-01-1951 MRN [id number]    Chief Complaint: Obstructive Sleep Apnea visit  Brief History:  Brevan is seen today for follow up The patient has a one year history of sleep apnea. Patient is using PAP nightly.  The patient feels more rested after sleeping with PAP.  He goes to bed around 8 p.m. and wakes at 4 a.m.The patient reports benefiting from PAP use. Occasionally he wakes early and can't get back to sleep.Reported sleepiness is  improved and the Epworth Sleepiness Score is 1 out of 24. The patient does not take naps. The patient complains of the following: no complaints  The compliance download [email or web address]  compliance with an average use time of 8.1 hours. The AHI is 0.7  The patient does not complain of limb movements disrupting sleep.  ROS  General: (-) fever, (-) chills, (-) night sweat Nose and Sinuses: (-) nasal stuffiness or itchiness, (-) postnasal drip, (-) nosebleeds, (-) sinus trouble. Mouth and Throat: (-) sore throat, (-) hoarseness. Neck: (-) swollen glands, (-) enlarged thyroid, (-) neck pain. Respiratory: - cough, - shortness of breath, - wheezing. Neurologic: - numbness, - tingling. Psychiatric: - anxiety, - depression   Current Medication: Outpatient Encounter Medications as of 08/06/2020  Medication Sig  . apixaban (ELIQUIS) 5 MG TABS tablet Take 1 tablet (5 mg total) by mouth 2 (two) times daily.  Marland Kitchen diltiazem (CARDIZEM CD) 240 MG 24 hr capsule Take 1 capsule (240 mg total) by mouth daily.  . metoprolol tartrate (LOPRESSOR) 25 MG tablet Take 1 tablet (25 mg total) by mouth 2 (two) times daily.  . tamsulosin (FLOMAX) 0.4 MG CAPS capsule Take 1 capsule (0.4 mg total) by mouth daily. (Patient not taking: Reported on 05/10/2019)   No facility-administered encounter medications on file as of 08/06/2020.     Surgical History: Past Surgical History:  Procedure Laterality Date  . CATARACT EXTRACTION Bilateral   . SEPTOPLASTY    . TONSILECTOMY, ADENOIDECTOMY, BILATERAL MYRINGOTOMY AND TUBES      Medical History: Past Medical History:  Diagnosis Date  . Dysrhythmia   . ED (erectile dysfunction)   . Hematuria   . HLD (hyperlipidemia)   . Over weight   . Prostate cancer ([place])   . Rising PSA level   . Squamous cell carcinoma     Family History: Non contributory to the present illness  Social History: Social History   Socioeconomic History  . Marital status: Married    Spouse name: Not on file  . Number of children: Not on file  . Years of education: Not on file  . Highest education level: Not on file  Occupational History  . Occupation: retired  Tobacco Use  . Smoking status: Former Smoker    Quit date: 04/10/1976    Years since quitting: 44.3  . Smokeless tobacco: Never Used  Substance and Sexual Activity  . Alcohol use: No  . Drug use: No  . Sexual activity: Not on file  Other Topics Concern  . Not on file  Social History Narrative  . Not on file   Social Determinants of Health   Financial Resource Strain:   . Difficulty of Paying Living Expenses: Not on file  Food Insecurity:   . Worried About Charity fundraiser in the Last Year: Not on file  . Ran Out of Food in the Last  Year: Not on file  Transportation Needs:   . Lack of Transportation (Medical): Not on file  . Lack of Transportation (Non-Medical): Not on file  Physical Activity:   . Days of Exercise per Week: Not on file  . Minutes of Exercise per Session: Not on file  Stress:   . Feeling of Stress : Not on file  Social Connections:   . Frequency of Communication with Friends and Family: Not on file  . Frequency of Social Gatherings with Friends and Family: Not on file  . Attends Religious Services: Not on file  . Active Member of Clubs or Organizations: Not on file  . Attends Theatre manager Meetings: Not on file  . Marital Status: Not on file  Intimate Partner Violence:   . Fear of Current or Ex-Partner: Not on file  . Emotionally Abused: Not on file  . Physically Abused: Not on file  . Sexually Abused: Not on file    Vital Signs: Blood pressure (!) 145/79, pulse (!) 57, height 6\' 3"  (1.905 m), weight 249 lb (112.9 kg), SpO2 96 %.  Examination: General Appearance: The patient is well-developed, well-nourished, and in no distress. Neck Circumference: 40 Skin: Gross inspection of skin unremarkable. Head: normocephalic, no gross deformities. Eyes: no gross deformities noted. ENT: ears appear grossly normal Neurologic: Alert and oriented. No involuntary movements.    EPWORTH SLEEPINESS SCALE:  Scale:  (0)= no chance of dozing; (1)= slight chance of dozing; (2)= moderate chance of dozing; (3)= high chance of dozing  Chance  Situtation    Sitting and reading: 1    Watching TV: 0    Sitting Inactive in public: 0    As a passenger in car: 0      Lying down to rest: 0    Sitting and talking: 0    Sitting quielty after lunch: 0    In a car, stopped in traffic: 0   TOTAL SCORE:   1 out of 24    SLEEP STUDIES:  1. PSG 11/15/18 AHI 5 SpO43min 89%   CPAP COMPLIANCE DATA:  Date Range: 08/04/19-08/02/20  Average Daily Use: 8.1 hours  Median Use: 8.1  Compliance for > 4 Hours: 100%  AHI: 0.7 respiratory events per hour  Days Used: 365/365  Mask Leak: 16.3  95th Percentile Pressure: 9         LABS: Recent Results (from the past 2160 hour(s))  PSA     Status: None   Collection Time: 05/10/20 11:16 AM  Result Value Ref Range   Prostate Specific Ag, Serum <0.1 0.0 - 4.0 ng/mL    Comment: Roche ECLIA methodology. According to the American Urological Association, Serum PSA should decrease and remain at undetectable levels after radical prostatectomy. The AUA defines biochemical recurrence as an initial PSA value 0.2 ng/mL or  greater followed by a subsequent confirmatory PSA value 0.2 ng/mL or greater. Values obtained with different assay methods or kits cannot be used interchangeably. Results cannot be interpreted as absolute evidence of the presence or absence of malignant disease.     Radiology: No results found.  No results found.  No results found.    Assessment and Plan: Patient Active Problem List   Diagnosis Date Noted  . A-fib ([place]) 11/21/2016  . CA of prostate ([place]) 01/08/2015  . HLD (hyperlipidemia) 04/13/2014  . 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 ED (erectile dysfunction) of organic origin 04/05/2014      The patient does tolerate PAP and reports  significant benefit from PAP use. The patient was reminded how to clean the CPAP and how to adjust the mask fit. The patient was also counselled on increasing his daily exercise. The compliance is excellent. The apnea is well controlled.   1. OSA- continue excellent compliance. Follow up in one year 2. CPAP couseling-Discussed importance of adequate CPAP use as well as proper care and cleaning techniques of machine and all supplies. 3. A.fib--appears stable at this time, no indication of cardiac arrhythmias on download, continue with prescribed medication and cardiology follow-up as indicated  General Counseling: I have discussed the findings of the evaluation and examination with Abdo.  I have also discussed any further diagnostic evaluation thatmay be needed or ordered today. Lenzy verbalizes understanding of the findings of todays visit. We also reviewed his medications today and discussed drug interactions and side effects including but not limited excessive drowsiness and altered mental states. We also discussed that there is always a risk not just to him but also people around him. he has been encouraged to call the office with any questions or concerns that should arise related to todays visit.  I have personally obtained a  history, examined the patient, evaluated laboratory and imaging results, formulated the assessment and plan and placed orders.  This patient was seen by Theodoro Grist AGNP-C in Collaboration with Dr. Devona Konig as a part of collaborative care agreement.  Richelle Ito Saunders Glance, PhD, FAASM  Diplomate, American Board of Sleep Medicine    Allyne Gee, MD [facility] Diplomate ABMS Pulmonary and Critical Care Medicine Sleep medicine

## 2020-08-14 DIAGNOSIS — Z86010 Personal history of colonic polyps: Secondary | ICD-10-CM | POA: Diagnosis not present

## 2020-08-14 DIAGNOSIS — Z7901 Long term (current) use of anticoagulants: Secondary | ICD-10-CM | POA: Diagnosis not present

## 2020-09-05 DIAGNOSIS — H02054 Trichiasis without entropian left upper eyelid: Secondary | ICD-10-CM | POA: Diagnosis not present

## 2020-09-20 DIAGNOSIS — D2262 Melanocytic nevi of left upper limb, including shoulder: Secondary | ICD-10-CM | POA: Diagnosis not present

## 2020-09-20 DIAGNOSIS — D2261 Melanocytic nevi of right upper limb, including shoulder: Secondary | ICD-10-CM | POA: Diagnosis not present

## 2020-09-20 DIAGNOSIS — D225 Melanocytic nevi of trunk: Secondary | ICD-10-CM | POA: Diagnosis not present

## 2020-09-20 DIAGNOSIS — D2271 Melanocytic nevi of right lower limb, including hip: Secondary | ICD-10-CM | POA: Diagnosis not present

## 2020-09-20 DIAGNOSIS — Z85828 Personal history of other malignant neoplasm of skin: Secondary | ICD-10-CM | POA: Diagnosis not present

## 2020-09-26 ENCOUNTER — Other Ambulatory Visit
Admission: RE | Admit: 2020-09-26 | Discharge: 2020-09-26 | Disposition: A | Discharge status: Home / Self Care | Payer: PPO | Source: Ambulatory Visit | Attending: Gastroenterology | Admitting: Gastroenterology

## 2020-09-26 ENCOUNTER — Other Ambulatory Visit: Payer: Self-pay

## 2020-09-26 DIAGNOSIS — Z20822 Contact with and (suspected) exposure to covid-19: Secondary | ICD-10-CM | POA: Insufficient documentation

## 2020-09-26 DIAGNOSIS — Z01812 Encounter for preprocedural laboratory examination: Secondary | ICD-10-CM | POA: Insufficient documentation

## 2020-09-26 LAB — SARS CORONAVIRUS 2 (TAT 6-24 HRS): SARS Coronavirus 2: NEGATIVE

## 2020-09-27 ENCOUNTER — Encounter: Payer: Self-pay | Admitting: *Deleted

## 2020-09-28 ENCOUNTER — Other Ambulatory Visit: Payer: Self-pay

## 2020-09-28 ENCOUNTER — Ambulatory Visit: Payer: PPO | Admitting: Certified Registered Nurse Anesthetist

## 2020-09-28 ENCOUNTER — Encounter
Admission: RE | Disposition: A | Discharge status: Home / Self Care | Payer: Self-pay | Source: Home / Self Care | Attending: Gastroenterology

## 2020-09-28 ENCOUNTER — Encounter: Payer: Self-pay | Admitting: *Deleted

## 2020-09-28 ENCOUNTER — Ambulatory Visit
Admission: RE | Admit: 2020-09-28 | Discharge: 2020-09-28 | Disposition: A | Discharge status: Home / Self Care | Payer: PPO | Attending: Gastroenterology | Admitting: Gastroenterology

## 2020-09-28 DIAGNOSIS — Z86010 Personal history of colonic polyps: Secondary | ICD-10-CM | POA: Insufficient documentation

## 2020-09-28 DIAGNOSIS — K5790 Diverticulosis of intestine, part unspecified, without perforation or abscess without bleeding: Secondary | ICD-10-CM | POA: Diagnosis not present

## 2020-09-28 DIAGNOSIS — Z79899 Other long term (current) drug therapy: Secondary | ICD-10-CM | POA: Diagnosis not present

## 2020-09-28 DIAGNOSIS — Z7901 Long term (current) use of anticoagulants: Secondary | ICD-10-CM | POA: Diagnosis not present

## 2020-09-28 DIAGNOSIS — Z1211 Encounter for screening for malignant neoplasm of colon: Secondary | ICD-10-CM | POA: Diagnosis not present

## 2020-09-28 DIAGNOSIS — K5730 Diverticulosis of large intestine without perforation or abscess without bleeding: Secondary | ICD-10-CM | POA: Insufficient documentation

## 2020-09-28 HISTORY — PX: COLONOSCOPY WITH PROPOFOL: SHX5780

## 2020-09-28 SURGERY — COLONOSCOPY WITH PROPOFOL
Anesthesia: General

## 2020-09-28 MED ORDER — PROPOFOL 500 MG/50ML IV EMUL
INTRAVENOUS | Status: DC | PRN
  Administered 2020-09-28: 135 ug/kg/min via INTRAVENOUS

## 2020-09-28 MED ORDER — LIDOCAINE HCL (CARDIAC) PF 100 MG/5ML IV SOSY
PREFILLED_SYRINGE | INTRAVENOUS | Status: DC | PRN
  Administered 2020-09-28: 50 mg via INTRAVENOUS

## 2020-09-28 MED ORDER — PROPOFOL 10 MG/ML IV BOLUS
INTRAVENOUS | Status: DC | PRN
  Administered 2020-09-28: 90 mg via INTRAVENOUS
  Administered 2020-09-28: 23 mg via INTRAVENOUS

## 2020-09-28 MED ORDER — SODIUM CHLORIDE 0.9 % IV SOLN: INTRAVENOUS | Status: DC

## 2020-09-28 MED ORDER — PROPOFOL 500 MG/50ML IV EMUL
INTRAVENOUS | Status: AC
  Filled 2020-09-28: qty 50

## 2020-09-28 NOTE — Interval H&P Note (Signed)
History and Physical Interval Note:  09/28/2020 2:24 PM  Joshua Barajas  has presented today for surgery, with the diagnosis of HX ADEN POLYP.  The various methods of treatment have been discussed with the patient and family. After consideration of risks, benefits and other options for treatment, the patient has consented to  Procedure(s): COLONOSCOPY WITH PROPOFOL (N/A) as a surgical intervention.  The patient's history has been reviewed, patient examined, no change in status, stable for surgery.  I have reviewed the patient's chart and labs.  Questions were answered to the patient's satisfaction.     Lesly Rubenstein  Ok to proceed with colonoscopy

## 2020-09-28 NOTE — Anesthesia Preprocedure Evaluation (Signed)
Anesthesia Evaluation  Patient identified by MRN, date of birth, ID band Patient awake    Reviewed: Allergy & Precautions, H&P , NPO status , Patient's Chart, lab work & pertinent test results, reviewed documented beta blocker date and time   History of Anesthesia Complications Negative for: history of anesthetic complications  Airway Mallampati: II  TM Distance: >3 FB Neck ROM: full    Dental  (+) Dental Advidsory Given, Caps, Teeth Intact   Pulmonary neg shortness of breath, sleep apnea and Continuous Positive Airway Pressure Ventilation , neg recent URI, former smoker,    Pulmonary exam normal breath sounds clear to auscultation       Cardiovascular Exercise Tolerance: Good (-) hypertension(-) angina(-) Past MI and (-) Cardiac Stents + dysrhythmias Atrial Fibrillation (-) Valvular Problems/Murmurs Rhythm:regular Rate:Normal     Neuro/Psych negative neurological ROS  negative psych ROS   GI/Hepatic negative GI ROS, Neg liver ROS,   Endo/Other  negative endocrine ROS  Renal/GU negative Renal ROS  negative genitourinary   Musculoskeletal   Abdominal   Peds  Hematology negative hematology ROS (+)   Anesthesia Other Findings Past Medical History: No date: Dysrhythmia No date: ED (erectile dysfunction) No date: Hematuria No date: HLD (hyperlipidemia) No date: Over weight No date: Prostate cancer ([facility]) No date: Rising PSA level No date: Squamous cell carcinoma   Reproductive/Obstetrics negative OB ROS                             Anesthesia Physical Anesthesia Plan  ASA: II  Anesthesia Plan: General   Post-op Pain Management:    Induction: Intravenous  PONV Risk Score and Plan: 2 and Propofol infusion and TIVA  Airway Management Planned: Natural Airway and Nasal Cannula  Additional Equipment:   Intra-op Plan:   Post-operative Plan:   Informed Consent: I have reviewed the  patients History and Physical, chart, labs and discussed the procedure including the risks, benefits and alternatives for the proposed anesthesia with the patient or authorized representative who has indicated his/her understanding and acceptance.     Dental Advisory Given  Plan Discussed with: Anesthesiologist, CRNA and Surgeon  Anesthesia Plan Comments:         Anesthesia Quick Evaluation

## 2020-09-28 NOTE — H&P (Signed)
Outpatient short stay form Pre-procedure 09/28/2020 2:22 PM Joshua Miyamoto MD, MPH  Primary Physician: Dr. Ellison Hughs  Reason for visit:  Surveillance Colonoscopy  History of present illness:   69 y/o with history of a. Fib on eliquis with last dose > 48 hours ago here for surveillance colonoscopy. Had two polyps in 2016 that were adenomatous. No abdominal surgeries. No family history of GI malignancies. No new symptoms such as chest pain or shortness of breath.    Current Facility-Administered Medications:  .  0.9 %  sodium chloride infusion, , Intravenous, Continuous, Timberlynn Kizziah, Hilton Cork, MD, Last Rate: 20 mL/hr at 09/28/20 1400, New Bag at 09/28/20 1400  Medications Prior to Admission  Medication Sig Dispense Refill Last Dose  . diltiazem (CARDIZEM CD) 240 MG 24 hr capsule Take 1 capsule (240 mg total) by mouth daily. 30 capsule 2 09/28/2020 at Unknown time  . metoprolol tartrate (LOPRESSOR) 25 MG tablet Take 1 tablet (25 mg total) by mouth 2 (two) times daily. 60 tablet 2 09/28/2020 at Unknown time  . tamsulosin (FLOMAX) 0.4 MG CAPS capsule Take 1 capsule (0.4 mg total) by mouth daily. 30 capsule 11 Past Month at Unknown time  . apixaban (ELIQUIS) 5 MG TABS tablet Take 1 tablet (5 mg total) by mouth 2 (two) times daily. 60 tablet 2 09/19/2020     Allergies  Allergen Reactions  . Latex Rash  . Other Rash    If leaves on for few days     Past Medical History:  Diagnosis Date  . Dysrhythmia   . ED (erectile dysfunction)   . Hematuria   . HLD (hyperlipidemia)   . Over weight   . Prostate cancer ([place])   . Rising PSA level   . Squamous cell carcinoma     Review of systems:  Otherwise negative.    Physical Exam  Gen: Alert, oriented. Appears stated age.  HEENT: PERRLA. Lungs: No respiratory distress CV: RRR Abd: soft, benign, no masses Ext: No edema    Planned procedures: Proceed with colonoscopy. The patient understands the nature of the planned procedure,  indications, risks, alternatives and potential complications including but not limited to bleeding, infection, perforation, damage to internal organs and possible oversedation/side effects from anesthesia. The patient agrees and gives consent to proceed.  Please refer to procedure notes for findings, recommendations and patient disposition/instructions.     Joshua Miyamoto MD, MPH Gastroenterology 09/28/2020  2:22 PM

## 2020-09-28 NOTE — Transfer of Care (Signed)
Immediate Anesthesia Transfer of Care Note  Patient: Joshua Barajas  Procedure(s) Performed: COLONOSCOPY WITH PROPOFOL (N/A )  Patient Location: PACU and Endoscopy Unit  Anesthesia Type:General  Level of Consciousness: awake, alert , oriented and patient cooperative  Airway & Oxygen Therapy: Patient Spontanous Breathing  Post-op Assessment: Report given to RN and Post -op Vital signs reviewed and stable  Post vital signs: Reviewed and stable  Last Vitals: see nursing vitals sign flow sheet Vitals Value Taken Time  BP    Temp    Pulse    Resp    SpO2      Last Pain:  Vitals:   09/28/20 1356  TempSrc: Temporal  PainSc: 0-No pain         Complications: No complications documented.

## 2020-09-28 NOTE — Op Note (Addendum)
[facility] Gastroenterology Patient Name: Joshua Barajas Procedure Date: 09/28/2020 2:31 PM MRN: [id number] Account #: [id number] Date of Birth: 1951-08-31 Admit Type: Outpatient Age: 69 Room: [facility] ENDO ROOM 2 Gender: Male Note Status: Finalized Procedure:             Colonoscopy Indications:           Surveillance: Personal history of adenomatous polyps                         on last colonoscopy 5 years ago Providers:             Andrey Farmer MD, MD Referring MD:          [name] (Referring MD) Medicines:             Monitored Anesthesia Care Complications:         No immediate complications. Procedure:             Pre-Anesthesia Assessment:                        - Prior to the procedure, a History and Physical was                         performed, and patient medications and allergies were                         reviewed. The patient is competent. The risks and                         benefits of the procedure and the sedation options and                         risks were discussed with the patient. All questions                         were answered and informed consent was obtained.                         Patient identification and proposed procedure were                         verified by the physician, the nurse, the anesthetist                         and the technician in the endoscopy suite. Mental                         Status Examination: alert and oriented. Airway                         Examination: normal oropharyngeal airway and neck                         mobility. Respiratory Examination: clear to                         auscultation. CV Examination: normal. Prophylactic  Antibiotics: The patient does not require prophylactic                         antibiotics. Prior Anticoagulants: The patient has                         taken Eliquis (apixaban), last dose was 2 days prior                          to procedure. ASA Grade Assessment: II - A patient                         with mild systemic disease. After reviewing the risks                         and benefits, the patient was deemed in satisfactory                         condition to undergo the procedure. The anesthesia                         plan was to use monitored anesthesia care (MAC).                         Immediately prior to administration of medications,                         the patient was re-assessed for adequacy to receive                         sedatives. The heart rate, respiratory rate, oxygen                         saturations, blood pressure, adequacy of pulmonary                         ventilation, and response to care were monitored                         throughout the procedure. The physical status of the                         patient was re-assessed after the procedure.                        After obtaining informed consent, the colonoscope was                         passed under direct vision. Throughout the procedure,                         the patient's blood pressure, pulse, and oxygen                         saturations were monitored continuously. The                         Colonoscope was introduced through the anus and  advanced to the the cecum, identified by appendiceal                         orifice and ileocecal valve. The colonoscopy was                         performed without difficulty. The patient tolerated                         the procedure well. The quality of the bowel                         preparation was good. Findings:      The perianal and digital rectal examinations were normal.      Multiple small-mouthed diverticula were found in the sigmoid colon.      The exam was otherwise without abnormality on direct and retroflexion       views. Impression:            - Diverticulosis in the sigmoid colon.                        - The examination  was otherwise normal on direct and                         retroflexion views.                        - No specimens collected. Recommendation:        - Discharge patient to home.                        - Resume previous diet.                        - Continue present medications.                        - Repeat colonoscopy in 10 years for surveillance.                        - Return to referring physician as previously                         scheduled.                        - Resume Eliquis (apixaban) at prior dose today. Procedure Code(s):     --- Professional ---                        V6720, Colorectal cancer screening; colonoscopy on                         individual at high risk Diagnosis Code(s):     --- Professional ---                        Z86.010, Personal history of colonic polyps                        K57.30, Diverticulosis of large intestine without  perforation or abscess without bleeding CPT copyright 2019 American Medical Association. All rights reserved. The codes documented in this report are preliminary and upon coder review may  be revised to meet current compliance requirements. Andrey Farmer, MD Andrey Farmer MD, MD 09/28/2020 2:54:24 PM Number of Addenda: 0 Note Initiated On: 09/28/2020 2:31 PM Scope Withdrawal Time: 0 hours 7 minutes 41 seconds  Total Procedure Duration: 0 hours 16 minutes 1 second  Estimated Blood Loss:  Estimated blood loss: none.      [facility]

## 2020-10-01 ENCOUNTER — Encounter: Payer: Self-pay | Admitting: Gastroenterology

## 2020-10-05 NOTE — Anesthesia Postprocedure Evaluation (Signed)
Anesthesia Post Note  Patient: Joshua Barajas  Procedure(s) Performed: COLONOSCOPY WITH PROPOFOL (N/A )  Patient location during evaluation: Endoscopy Anesthesia Type: General Level of consciousness: awake and alert Pain management: pain level controlled Vital Signs Assessment: post-procedure vital signs reviewed and stable Respiratory status: spontaneous breathing, nonlabored ventilation, respiratory function stable and patient connected to nasal cannula oxygen Cardiovascular status: blood pressure returned to baseline and stable Postop Assessment: no apparent nausea or vomiting Anesthetic complications: no   No complications documented.   Last Vitals:  Vitals:   09/28/20 1505 09/28/20 1515  BP: 114/64 116/64  Pulse: (!) 47 (!) 47  Resp: 14 15  Temp:    SpO2: 100% 100%    Last Pain:  Vitals:   09/30/20 0956  TempSrc:   PainSc: 0-No pain                 Martha Clan

## 2020-11-08 DIAGNOSIS — D649 Anemia, unspecified: Secondary | ICD-10-CM | POA: Diagnosis not present

## 2020-11-08 DIAGNOSIS — Z0000 Encounter for general adult medical examination without abnormal findings: Secondary | ICD-10-CM | POA: Diagnosis not present

## 2020-11-08 DIAGNOSIS — C61 Malignant neoplasm of prostate: Secondary | ICD-10-CM | POA: Diagnosis not present

## 2020-11-08 DIAGNOSIS — E538 Deficiency of other specified B group vitamins: Secondary | ICD-10-CM | POA: Diagnosis not present

## 2020-11-08 DIAGNOSIS — G4733 Obstructive sleep apnea (adult) (pediatric): Secondary | ICD-10-CM | POA: Diagnosis not present

## 2020-11-08 DIAGNOSIS — I480 Paroxysmal atrial fibrillation: Secondary | ICD-10-CM | POA: Diagnosis not present

## 2020-11-16 DIAGNOSIS — I480 Paroxysmal atrial fibrillation: Secondary | ICD-10-CM | POA: Diagnosis not present

## 2020-11-16 DIAGNOSIS — E7801 Familial hypercholesterolemia: Secondary | ICD-10-CM | POA: Diagnosis not present

## 2020-11-16 DIAGNOSIS — G4733 Obstructive sleep apnea (adult) (pediatric): Secondary | ICD-10-CM | POA: Diagnosis not present

## 2020-11-27 ENCOUNTER — Ambulatory Visit (LOCAL_COMMUNITY_HEALTH_CENTER): Payer: Self-pay

## 2020-11-27 ENCOUNTER — Other Ambulatory Visit: Payer: Self-pay

## 2020-11-27 DIAGNOSIS — Z23 Encounter for immunization: Secondary | ICD-10-CM

## 2020-11-27 NOTE — Progress Notes (Unsigned)
Tdap given; tolerated well.Aileen Fass, RN

## 2020-12-02 IMAGING — DX DG HAND COMPLETE 3+V*L*
3 series · 3 of 3 positions shown · non-contrast
Comparison: None.

CLINICAL DATA: 67 y/o M; injury with abrasion to the thumb and
index fever.

EXAM:
LEFT HAND - COMPLETE 3+ VIEW

[hand ap]
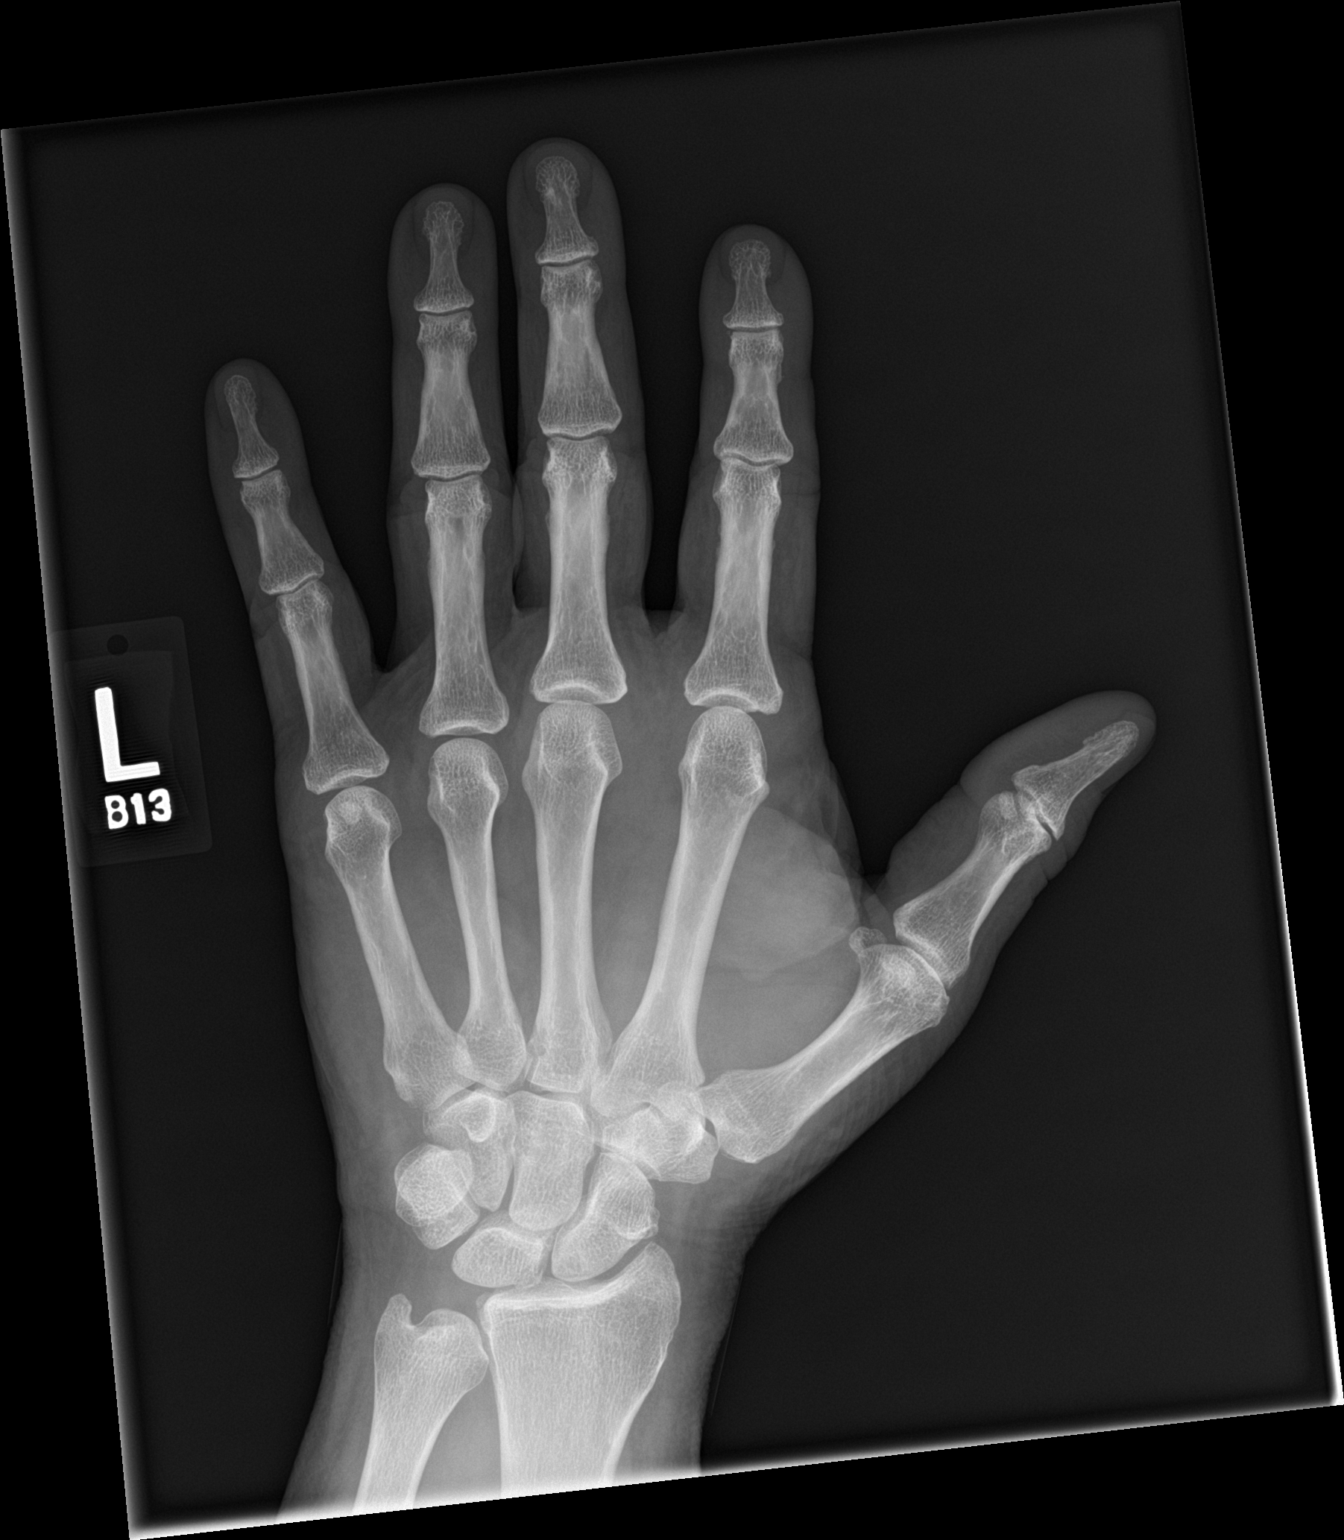

[hand obl]
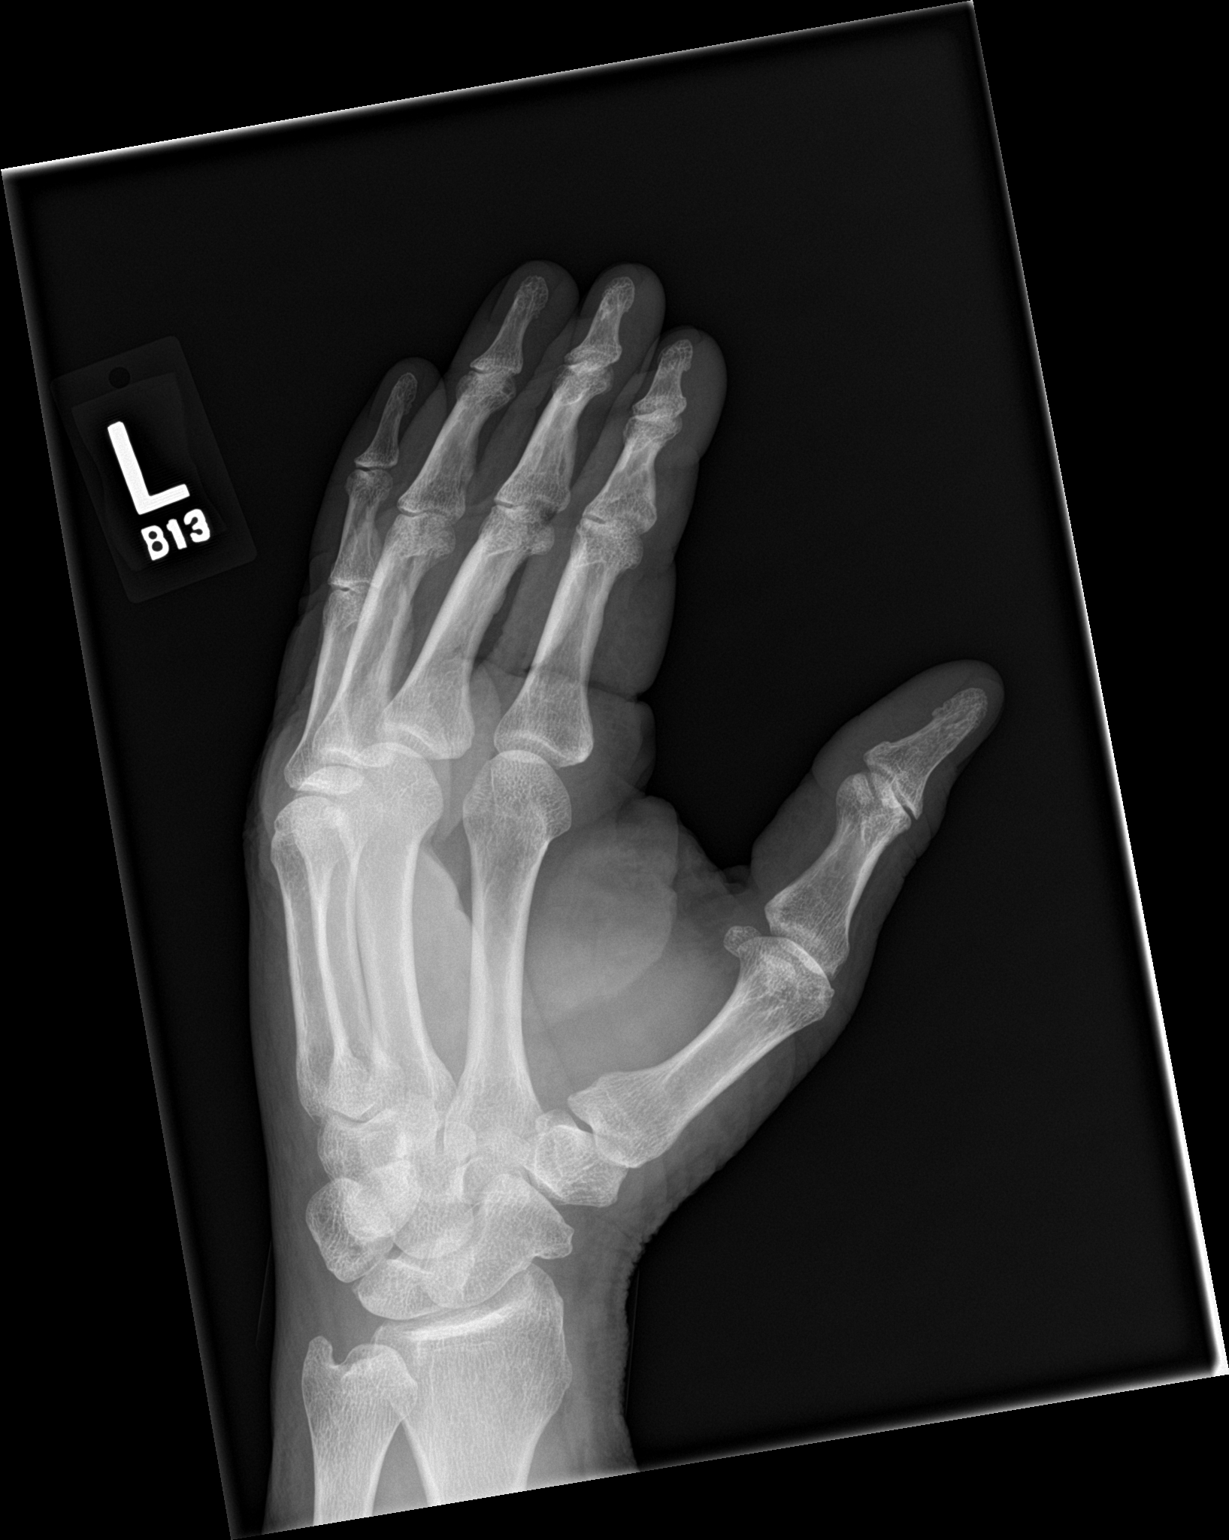

[hand lat]
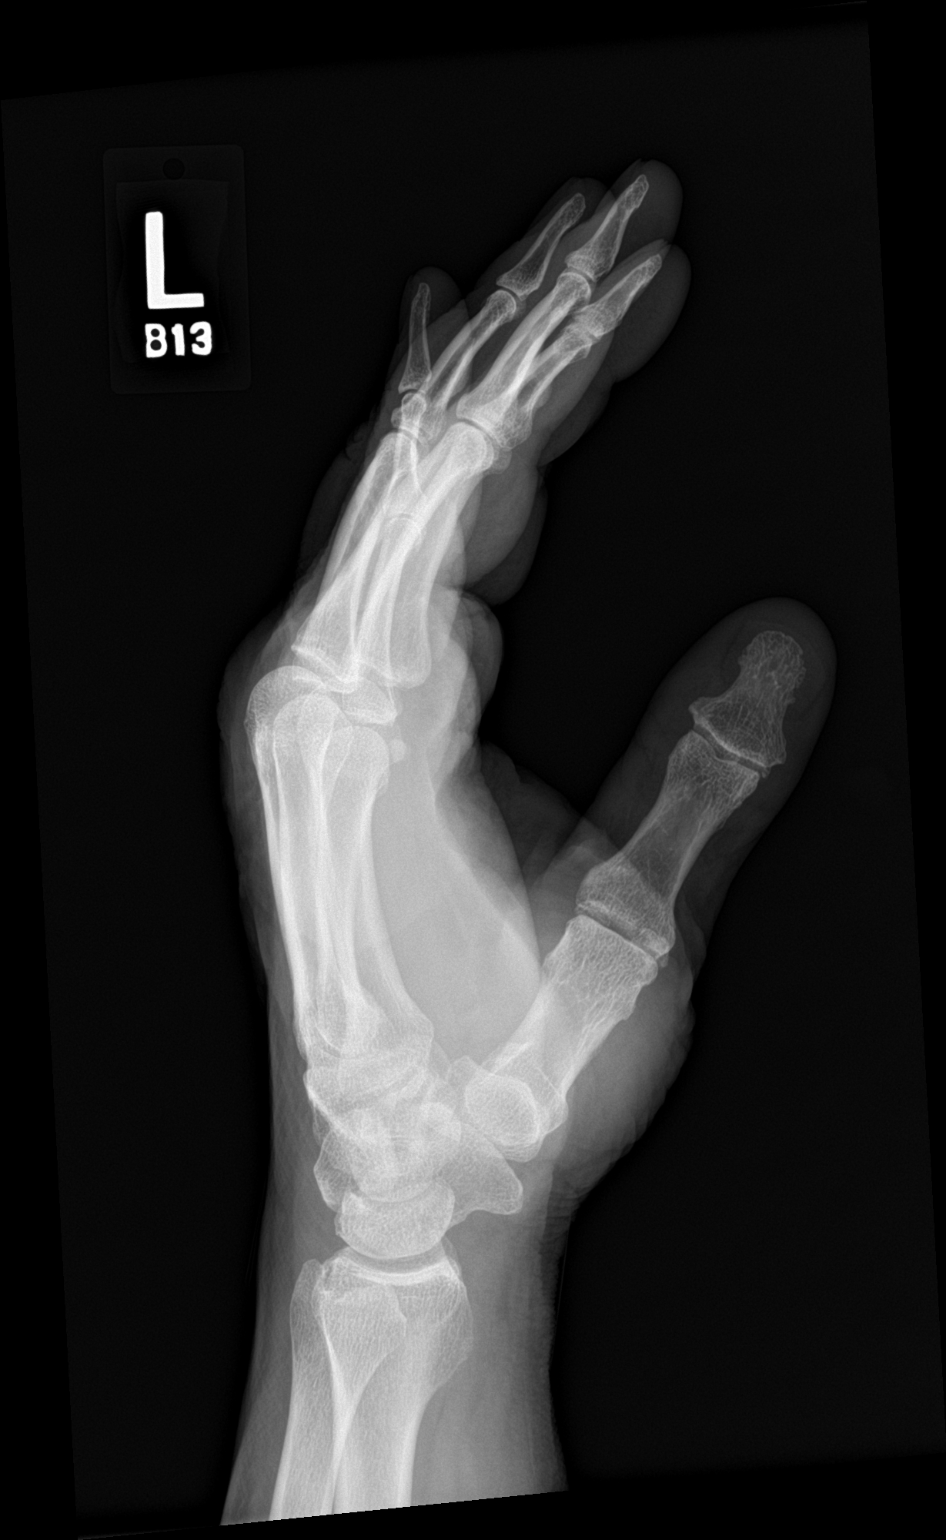

[3 of 3 positions shown; findings below may reference images not displayed]

FINDINGS: Tiny avulsion fracture of the ulnar aspect of head of second middle
phalanx. No other fracture or dislocation identified.
IMPRESSION: Tiny avulsion fracture of the ulnar aspect of head of second middle
phalanx.

## 2021-02-20 ENCOUNTER — Emergency Department: Payer: PPO

## 2021-02-20 ENCOUNTER — Encounter: Payer: Self-pay | Admitting: Emergency Medicine

## 2021-02-20 ENCOUNTER — Emergency Department
Admission: EM | Admit: 2021-02-20 | Discharge: 2021-02-20 | Disposition: A | Discharge status: Home / Self Care | Payer: PPO | Attending: Emergency Medicine | Admitting: Emergency Medicine

## 2021-02-20 ENCOUNTER — Other Ambulatory Visit: Payer: Self-pay

## 2021-02-20 DIAGNOSIS — S0181XA Laceration without foreign body of other part of head, initial encounter: Secondary | ICD-10-CM | POA: Diagnosis not present

## 2021-02-20 DIAGNOSIS — Z8546 Personal history of malignant neoplasm of prostate: Secondary | ICD-10-CM | POA: Insufficient documentation

## 2021-02-20 DIAGNOSIS — S022XXA Fracture of nasal bones, initial encounter for closed fracture: Secondary | ICD-10-CM | POA: Insufficient documentation

## 2021-02-20 DIAGNOSIS — S51011A Laceration without foreign body of right elbow, initial encounter: Secondary | ICD-10-CM | POA: Diagnosis not present

## 2021-02-20 DIAGNOSIS — W19XXXA Unspecified fall, initial encounter: Secondary | ICD-10-CM

## 2021-02-20 DIAGNOSIS — Z7901 Long term (current) use of anticoagulants: Secondary | ICD-10-CM | POA: Insufficient documentation

## 2021-02-20 DIAGNOSIS — M542 Cervicalgia: Secondary | ICD-10-CM | POA: Diagnosis not present

## 2021-02-20 DIAGNOSIS — Z91040 Latex allergy status: Secondary | ICD-10-CM | POA: Diagnosis not present

## 2021-02-20 DIAGNOSIS — S0990XA Unspecified injury of head, initial encounter: Secondary | ICD-10-CM | POA: Diagnosis not present

## 2021-02-20 DIAGNOSIS — W11XXXA Fall on and from ladder, initial encounter: Secondary | ICD-10-CM | POA: Insufficient documentation

## 2021-02-20 DIAGNOSIS — M7989 Other specified soft tissue disorders: Secondary | ICD-10-CM | POA: Diagnosis not present

## 2021-02-20 DIAGNOSIS — M25551 Pain in right hip: Secondary | ICD-10-CM | POA: Diagnosis not present

## 2021-02-20 DIAGNOSIS — S01419A Laceration without foreign body of unspecified cheek and temporomandibular area, initial encounter: Secondary | ICD-10-CM | POA: Diagnosis not present

## 2021-02-20 DIAGNOSIS — Z87891 Personal history of nicotine dependence: Secondary | ICD-10-CM | POA: Insufficient documentation

## 2021-02-20 DIAGNOSIS — I4891 Unspecified atrial fibrillation: Secondary | ICD-10-CM | POA: Diagnosis not present

## 2021-02-20 DIAGNOSIS — S0231XA Fracture of orbital floor, right side, initial encounter for closed fracture: Secondary | ICD-10-CM | POA: Diagnosis not present

## 2021-02-20 DIAGNOSIS — S0285XA Fracture of orbit, unspecified, initial encounter for closed fracture: Secondary | ICD-10-CM | POA: Insufficient documentation

## 2021-02-20 DIAGNOSIS — S0992XA Unspecified injury of nose, initial encounter: Secondary | ICD-10-CM | POA: Diagnosis present

## 2021-02-20 DIAGNOSIS — R001 Bradycardia, unspecified: Secondary | ICD-10-CM | POA: Diagnosis not present

## 2021-02-20 DIAGNOSIS — S01111A Laceration without foreign body of right eyelid and periocular area, initial encounter: Secondary | ICD-10-CM | POA: Diagnosis not present

## 2021-02-20 DIAGNOSIS — M25521 Pain in right elbow: Secondary | ICD-10-CM | POA: Diagnosis not present

## 2021-02-20 LAB — SAMPLE TO BLOOD BANK

## 2021-02-20 MED ORDER — LIDOCAINE-EPINEPHRINE-TETRACAINE (LET) TOPICAL GEL
3.0000 mL | Freq: Once | TOPICAL | Status: AC
  Administered 2021-02-20: 3 mL via TOPICAL
  Filled 2021-02-20: qty 3

## 2021-02-20 MED ORDER — CEPHALEXIN 500 MG PO CAPS: 500.0000 mg | ORAL_CAPSULE | Freq: Two times a day (BID) | ORAL | 0 refills | Status: AC

## 2021-02-20 MED ORDER — OXYCODONE-ACETAMINOPHEN 5-325 MG PO TABS: 1.0000 | ORAL_TABLET | ORAL | 0 refills | Status: DC | PRN

## 2021-02-20 MED ORDER — LIDOCAINE-EPINEPHRINE 2 %-1:100000 IJ SOLN
20.0000 mL | Freq: Once | INTRAMUSCULAR | Status: AC
  Administered 2021-02-20: 20 mL
  Filled 2021-02-20: qty 1

## 2021-02-20 NOTE — ED Provider Notes (Signed)
LACERATION REPAIR Performed by: Versie Starks Authorized by: Versie Starks Consent: Verbal consent obtained. Risks and benefits: risks, benefits and alternatives were discussed Consent given by: patient Patient identity confirmed: provided demographic data Prepped and Draped in normal sterile fashion Wound explored  Laceration Location: right elbow  Laceration Length: 5cm  No Foreign Bodies seen or palpated  Anesthesia: local infiltration  Local anesthetic: lidocaine 52%  epinephrine  Anesthetic total: 2 ml  Irrigation method: syringe Amount of cleaning: standard  Skin closure: external, 5-0 nylon  Number of sutures: 5  Technique: simple sutures  Patient tolerance: Patient tolerated the procedure well with no immediate complications.    Versie Starks, PA-C 02/20/21 1420    Blake Divine, MD 02/20/21 [phone]

## 2021-02-20 NOTE — ED Notes (Signed)
Patient reports falling from a 10 foot ladder, leaned against the eave of a home. Patient reports the ladder tilted off the house, causing him to fall backward, with the ladder landing on him. Patient reports hitting head, but denies LOC. Patient reports daily Eliquis, and daily aspirin. Patient is noted to have a well approximated v shaped laceration to the forehead, just over the right orbit, and an abrasion to the right elbow. Patient is alert, oriented x4.

## 2021-02-20 NOTE — ED Provider Notes (Signed)
[facility] Emergency Department Provider Note   ____________________________________________   Event Date/Time   First MD Initiated Contact with Patient 02/20/21 1140     (approximate)  I have reviewed the triage vital signs and the nursing notes.   HISTORY  Chief Complaint Fall    HPI Joshua Barajas is a 70 y.o. male with past medical history of hyperlipidemia, atrial fibrillation on Eliquis, and prostate cancer who presents to the ED following fall.  Patient reports that he was standing on an 8 or 9 foot ladder when he lost his balance and fell onto the deck, thinks he hit his face on the ladder on the way down.  He denies losing consciousness, does complain of throbbing pain above his right eye as well as pain in his right elbow and hip.  He denies any vision changes, numbness, or weakness.  He denies any pain in his neck, chest, or abdomen.  His neighbor is an EMT and applied dressing to his head prior to arrival.        Past Medical History:  Diagnosis Date  . Dysrhythmia   . ED (erectile dysfunction)   . Hematuria   . HLD (hyperlipidemia)   . Over weight   . Prostate cancer ([place])   . Rising PSA level   . Squamous cell carcinoma     Patient Active Problem List   Diagnosis Date Noted  . A-fib ([place]) 11/21/2016  . CA of prostate ([place]) 01/08/2015  . HLD (hyperlipidemia) 04/13/2014  . 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 ED (erectile dysfunction) of organic origin 04/05/2014    Past Surgical History:  Procedure Laterality Date  . CATARACT EXTRACTION Bilateral   . COLONOSCOPY    . COLONOSCOPY WITH PROPOFOL N/A 09/28/2020   Procedure: COLONOSCOPY WITH PROPOFOL;  Surgeon: Lesly Rubenstein, MD;  Location: [facility] ENDOSCOPY;  Service: Endoscopy;  Laterality: N/A;  . EYE SURGERY    . SEPTOPLASTY    . TONSILECTOMY, ADENOIDECTOMY, BILATERAL MYRINGOTOMY AND TUBES    . TONSILLECTOMY      Prior to Admission medications   Medication Sig  Start Date End Date Taking? Authorizing Provider  cephALEXin (KEFLEX) 500 MG capsule Take 1 capsule (500 mg total) by mouth 2 (two) times daily for 7 days. 02/20/21 02/27/21 Yes Blake Divine, MD  oxyCODONE-acetaminophen (PERCOCET) 5-325 MG tablet Take 1 tablet by mouth every 4 (four) hours as needed for severe pain. 02/20/21 02/20/22 Yes Blake Divine, MD  apixaban (ELIQUIS) 5 MG TABS tablet Take 1 tablet (5 mg total) by mouth 2 (two) times daily. 11/22/16   Fritzi Mandes, MD  diltiazem (CARDIZEM CD) 240 MG 24 hr capsule Take 1 capsule (240 mg total) by mouth daily. 11/23/16   Fritzi Mandes, MD  metoprolol tartrate (LOPRESSOR) 25 MG tablet Take 1 tablet (25 mg total) by mouth 2 (two) times daily. 11/22/16   Fritzi Mandes, MD  tamsulosin (FLOMAX) 0.4 MG CAPS capsule Take 1 capsule (0.4 mg total) by mouth daily. 05/04/18   Hollice Espy, MD    Allergies Latex and Other  Family History  Problem Relation Age of Onset  . Heart attack Father   . Rheum arthritis Father   . Heart disease Paternal Uncle     Social History Social History   Tobacco Use  . Smoking status: Former Smoker    Quit date: 04/10/1976    Years since quitting: 44.8  . Smokeless tobacco: Never Used  Vaping Use  . Vaping Use: Never used  Substance Use Topics  . Alcohol use: No  . Drug use: No    Review of Systems  Constitutional: No fever/chills Eyes: No visual changes. ENT: No sore throat.  Positive for facial pain. Cardiovascular: Denies chest pain. Respiratory: Denies shortness of breath. Gastrointestinal: No abdominal pain.  No nausea, no vomiting.  No diarrhea.  No constipation. Genitourinary: Negative for dysuria. Musculoskeletal: Negative for back pain.  Positive for right elbow and hip pain. Skin: Negative for rash. Neurological: Negative for headaches, focal weakness or numbness.  ____________________________________________   PHYSICAL EXAM:  VITAL SIGNS: ED Triage Vitals  Enc Vitals Group     BP 02/20/21  1116 96/60     Pulse Rate 02/20/21 1116 (!) 52     Resp 02/20/21 1116 16     Temp 02/20/21 1116 97.7 F (36.5 C)     Temp Source 02/20/21 1116 Oral     SpO2 02/20/21 1116 98 %     Weight 02/20/21 1108 248 lb 14.4 oz (112.9 kg)     Height 02/20/21 1108 6\' 3"  (1.905 m)     Head Circumference --      Peak Flow --      Pain Score 02/20/21 1108 5     Pain Loc --      Pain Edu? --      Excl. in [place]? --     Constitutional: Alert and oriented. Eyes: Conjunctivae are normal.  Pupils equal round and reactive to light bilaterally, extraocular movements intact. Head: 4 cm laceration above right eye with no active bleeding.  No facial bony tenderness to palpation. Nose: No congestion/rhinnorhea. Mouth/Throat: Mucous membranes are moist. Neck: Normal ROM Cardiovascular: Normal rate, regular rhythm. Grossly normal heart sounds. Respiratory: Normal respiratory effort.  No retractions. Lungs CTAB. Gastrointestinal: Soft and nontender. No distention. Genitourinary: deferred Musculoskeletal: No lower extremity tenderness nor edema. Tenderness to palpation of  Neurologic:  Normal speech and language. No gross focal neurologic deficits are appreciated. Skin: 1 cm laceration to right elbow with no active bleeding. Psychiatric: Mood and affect are normal. Speech and behavior are normal.  ____________________________________________   LABS (all labs ordered are listed, but only abnormal results are displayed)  Labs Reviewed  SAMPLE TO BLOOD BANK   ____________________________________________  EKG  ED ECG REPORT I, Blake Divine, the attending physician, personally viewed and interpreted this ECG.   Date: 02/20/2021  EKG Time: 11:23  Rate: 50  Rhythm: sinus bradycardia  Axis: Normal  Intervals:none  ST&T Change: None   PROCEDURES  Procedure(s) performed (including Critical Care):  Procedures   ____________________________________________   INITIAL IMPRESSION / ASSESSMENT AND  PLAN / ED COURSE       71 year old male with past medical history of hyperlipidemia, atrial fibrillation on Eliquis, and prostate cancer who presents to the ED following fall off of 8 to 9 foot ladder striking his head.  He denies LOC and does not have any focal neurologic deficits on exam.  Given his anticoagulated status we will check CT head, cervical spine, and maxillofacial.  He has lacerations to his right frontal scalp as well as his right elbow that will require repair, states his tetanus is up-to-date.  We will also assess with x-rays of right elbow and right hip.  CT head is negative for acute process, no evidence of cervical spine fracture.  Patient does have nondisplaced fractures of medial wall of right orbit, bilateral nasal bones, and nasal septum.  He may follow-up as an outpatient with ENT for  these fractures.  Facial lacerations were repaired with the assistance of PA Fisher.  X-ray of right elbow reviewed by me and shows no fracture or dislocation, x-ray of right hip cannot exclude fracture and we will further assess with CT scan.  Patient turned over to oncoming provider pending results of CT of right hip.  If this is negative and patient is able to ambulate, he would be appropriate for discharge home with ENT follow-up and suture removal in 1 week.      ____________________________________________   FINAL CLINICAL IMPRESSION(S) / ED DIAGNOSES  Final diagnoses:  Fall, initial encounter  Closed fracture of nasal bone, initial encounter  Closed fracture of orbit, initial encounter [facility])  Facial laceration, initial encounter     ED Discharge Orders         Ordered    cephALEXin (KEFLEX) 500 MG capsule  2 times daily        02/20/21 1523    oxyCODONE-acetaminophen (PERCOCET) 5-325 MG tablet  Every 4 hours PRN        02/20/21 1523           Note:  This document was prepared using Dragon voice recognition software and may include unintentional dictation errors.    Blake Divine, MD 02/20/21 1525

## 2021-02-20 NOTE — ED Notes (Signed)
Facial wounds cleansed by this RN. Patient and visitor at bedside updated on POC. Lidocaine provided to Dr. Charna Archer.

## 2021-02-20 NOTE — ED Notes (Signed)
Immobilizer applied by CG

## 2021-02-20 NOTE — ED Notes (Signed)
ED Provider at bedside. 

## 2021-02-20 NOTE — ED Notes (Signed)
This RN at bedside attempted to ambulate with patient. Patient noted to have increased pain and swelling to right knee. Dr. Kerman Passey notified.

## 2021-02-20 NOTE — Discharge Instructions (Addendum)
Please follow-up with your doctor in 7 days for suture removal.  Please call the number provided for orthopedics to arrange a follow-up appointment for evaluation if they do not call you tomorrow morning.  Please take your pain medication as needed but only as written.  Please wear your knee immobilizer when up and walking.  You may bear weight as tolerated please use a walker for assistance.

## 2021-02-20 NOTE — ED Provider Notes (Signed)
LACERATION REPAIR Performed by: Versie Starks Authorized by: Versie Starks Consent: Verbal consent obtained. Risks and benefits: risks, benefits and alternatives were discussed Consent given by: patient Patient identity confirmed: provided demographic data Prepped and Draped in normal sterile fashion Wound explored  Laceration Location: right brow  Laceration Length: 7cm  No Foreign Bodies seen or palpated  Anesthesia: local infiltration  Local anesthetic: lidocaine 2%  Epinephrine, let prior  Anesthetic total: 3 ml  Irrigation method: syringe Amount of cleaning: standard  Skin closure: sub q 5- vicryl, 3   External 5-0 nylon  Number of sutures: 3 sub q, 9 external   Technique: simple  Patient tolerance: Patient tolerated the procedure well with no immediate complications.    Versie Starks, PA-C 02/20/21 1418    Blake Divine, MD 02/20/21 [phone]

## 2021-02-20 NOTE — ED Notes (Signed)
Wound to elbow dressed. Wound to head dressed as well. Patient updated on POC, patient denies pain at this time.

## 2021-02-20 NOTE — ED Notes (Signed)
Patient transported to CT 

## 2021-02-20 NOTE — ED Triage Notes (Signed)
Fall from 10 foot ladder.  Either hit face on ladder or wall, fell onto deck.  Denies LOC.  AAOx3. Skin warm and dry. NAD

## 2021-02-20 NOTE — ED Notes (Addendum)
PA at bedside.

## 2021-02-20 NOTE — ED Provider Notes (Signed)
-----------------------------------------   4:29 PM on 02/20/2021 -----------------------------------------  Patient care assumed from Dr. Charna Archer.  CT scan of the right hip is negative for acute abnormality besides soft tissue swelling.  We will attempt to ambulate the patient.  Lungs patient is able to ambulate anticipate likely discharge home.  Patient agreeable to plan of care.  Patient is able to ambulate.  Does have knee pain in the right knee.  Upon my examination patient has a large joint effusion of the right knee.  States he felt like it twisted when he fell.  We will obtain x-rays as a precaution to rule out fracture.  If x-rays are negative anticipate discharge home with orthopedic follow-up.  They are attempting to obtain a walker for use at home.  Patient's knee x-ray is positive for a patella fracture.  Spoke to Dr. Harlow Mares who will see the patient in the office tomorrow.  We will place in a knee immobilizer weightbearing as tolerated.  Patient will use a walker to ambulate.  Patient will be discharged home with pain medication and orthopedic follow-up.   Harvest Dark, MD 02/20/21 1836

## 2021-02-21 DIAGNOSIS — S82001A Unspecified fracture of right patella, initial encounter for closed fracture: Secondary | ICD-10-CM | POA: Diagnosis not present

## 2021-02-24 DIAGNOSIS — Z85828 Personal history of other malignant neoplasm of skin: Secondary | ICD-10-CM | POA: Diagnosis not present

## 2021-02-24 DIAGNOSIS — Z7901 Long term (current) use of anticoagulants: Secondary | ICD-10-CM | POA: Diagnosis not present

## 2021-02-24 DIAGNOSIS — I4891 Unspecified atrial fibrillation: Secondary | ICD-10-CM | POA: Diagnosis not present

## 2021-02-24 DIAGNOSIS — S0181XD Laceration without foreign body of other part of head, subsequent encounter: Secondary | ICD-10-CM | POA: Diagnosis not present

## 2021-02-24 DIAGNOSIS — S51011D Laceration without foreign body of right elbow, subsequent encounter: Secondary | ICD-10-CM | POA: Diagnosis not present

## 2021-02-24 DIAGNOSIS — Z87891 Personal history of nicotine dependence: Secondary | ICD-10-CM | POA: Diagnosis not present

## 2021-02-24 DIAGNOSIS — C61 Malignant neoplasm of prostate: Secondary | ICD-10-CM | POA: Diagnosis not present

## 2021-02-24 DIAGNOSIS — S82034D Nondisplaced transverse fracture of right patella, subsequent encounter for closed fracture with routine healing: Secondary | ICD-10-CM | POA: Diagnosis not present

## 2021-02-24 DIAGNOSIS — Z9181 History of falling: Secondary | ICD-10-CM | POA: Diagnosis not present

## 2021-02-24 DIAGNOSIS — E785 Hyperlipidemia, unspecified: Secondary | ICD-10-CM | POA: Diagnosis not present

## 2021-02-24 DIAGNOSIS — G4730 Sleep apnea, unspecified: Secondary | ICD-10-CM | POA: Diagnosis not present

## 2021-02-25 DIAGNOSIS — S022XXA Fracture of nasal bones, initial encounter for closed fracture: Secondary | ICD-10-CM | POA: Diagnosis not present

## 2021-02-25 DIAGNOSIS — W11XXXA Fall on and from ladder, initial encounter: Secondary | ICD-10-CM | POA: Diagnosis not present

## 2021-02-25 DIAGNOSIS — R531 Weakness: Secondary | ICD-10-CM | POA: Diagnosis not present

## 2021-02-25 DIAGNOSIS — S0292XA Unspecified fracture of facial bones, initial encounter for closed fracture: Secondary | ICD-10-CM | POA: Diagnosis not present

## 2021-02-25 DIAGNOSIS — S0181XA Laceration without foreign body of other part of head, initial encounter: Secondary | ICD-10-CM | POA: Diagnosis not present

## 2021-02-25 DIAGNOSIS — S82041A Displaced comminuted fracture of right patella, initial encounter for closed fracture: Secondary | ICD-10-CM | POA: Diagnosis not present

## 2021-02-25 DIAGNOSIS — S02841A Fracture of lateral orbital wall, right side, initial encounter for closed fracture: Secondary | ICD-10-CM | POA: Diagnosis not present

## 2021-02-25 DIAGNOSIS — S51011A Laceration without foreign body of right elbow, initial encounter: Secondary | ICD-10-CM | POA: Diagnosis not present

## 2021-02-25 DIAGNOSIS — S7001XA Contusion of right hip, initial encounter: Secondary | ICD-10-CM | POA: Diagnosis not present

## 2021-02-28 DIAGNOSIS — S0181XD Laceration without foreign body of other part of head, subsequent encounter: Secondary | ICD-10-CM | POA: Diagnosis not present

## 2021-02-28 DIAGNOSIS — Z87891 Personal history of nicotine dependence: Secondary | ICD-10-CM | POA: Diagnosis not present

## 2021-02-28 DIAGNOSIS — E785 Hyperlipidemia, unspecified: Secondary | ICD-10-CM | POA: Diagnosis not present

## 2021-02-28 DIAGNOSIS — Z9181 History of falling: Secondary | ICD-10-CM | POA: Diagnosis not present

## 2021-02-28 DIAGNOSIS — Z85828 Personal history of other malignant neoplasm of skin: Secondary | ICD-10-CM | POA: Diagnosis not present

## 2021-02-28 DIAGNOSIS — S51011D Laceration without foreign body of right elbow, subsequent encounter: Secondary | ICD-10-CM | POA: Diagnosis not present

## 2021-02-28 DIAGNOSIS — G4730 Sleep apnea, unspecified: Secondary | ICD-10-CM | POA: Diagnosis not present

## 2021-02-28 DIAGNOSIS — S82034D Nondisplaced transverse fracture of right patella, subsequent encounter for closed fracture with routine healing: Secondary | ICD-10-CM | POA: Diagnosis not present

## 2021-02-28 DIAGNOSIS — C61 Malignant neoplasm of prostate: Secondary | ICD-10-CM | POA: Diagnosis not present

## 2021-02-28 DIAGNOSIS — Z7901 Long term (current) use of anticoagulants: Secondary | ICD-10-CM | POA: Diagnosis not present

## 2021-02-28 DIAGNOSIS — I4891 Unspecified atrial fibrillation: Secondary | ICD-10-CM | POA: Diagnosis not present

## 2021-03-01 DIAGNOSIS — R531 Weakness: Secondary | ICD-10-CM | POA: Diagnosis not present

## 2021-03-01 DIAGNOSIS — R269 Unspecified abnormalities of gait and mobility: Secondary | ICD-10-CM | POA: Diagnosis not present

## 2021-03-04 ENCOUNTER — Encounter: Payer: Self-pay | Admitting: Otolaryngology

## 2021-03-10 NOTE — Anesthesia Preprocedure Evaluation (Addendum)
Anesthesia Evaluation  Patient identified by MRN, date of birth, ID band Patient awake    Reviewed: Allergy & Precautions, NPO status , Patient's Chart, lab work & pertinent test results  History of Anesthesia Complications Negative for: history of anesthetic complications  Airway Mallampati: IV   Neck ROM: Full    Dental no notable dental hx.    Pulmonary sleep apnea and Continuous Positive Airway Pressure Ventilation , former smoker (quit 1977),    Pulmonary exam normal breath sounds clear to auscultation       Cardiovascular Exercise Tolerance: Good + dysrhythmias (a fib on Eliquis, last dose 7 days ago)  Rhythm:Irregular Rate:Normal  ECG 02/20/21: SB (HR 50), low voltage   Neuro/Psych negative neurological ROS     GI/Hepatic negative GI ROS,   Endo/Other  negative endocrine ROS  Renal/GU negative Renal ROS     Musculoskeletal   Abdominal   Peds  Hematology Skin SCC   Anesthesia Other Findings Cardiology note 11/16/20:  Assessment and Plan  70 y.o. male with   1. Atrial fibrillation, unspecified type (CMS-HCC)-was transient. Paroxysmal atrial fibrillation is been good control on current medications. Patient will remain on metoprolol and Cardizem as he is asymptomatic from the relative bradycardia. He has had no sustained breakthrough arrhythmias. We will continue with current regimen including Eliquis for anticoagulation. We will continue on current dose of Cardizem and metoprolol. He is relatively bradycardic but asymptomatic from this. I48.91 427.31  2. Atypical chest pain-as per above R07.89 786.59  3. Paroxysmal atrial fibrillation (CMS-HCC)-as per above-as per above I48.0 427.31  4. Familial hypercholesterolemia-low-fat diet LDL goal of less than 130. E78.01 272.0   Return in about 1 year (around 11/16/2021).    Reproductive/Obstetrics                            Anesthesia  Physical Anesthesia Plan  ASA: III  Anesthesia Plan: General   Post-op Pain Management:    Induction: Intravenous  PONV Risk Score and Plan: 2 and Treatment may vary due to age or medical condition, Ondansetron and Dexamethasone  Airway Management Planned: LMA  Additional Equipment:   Intra-op Plan:   Post-operative Plan: Extubation in OR  Informed Consent:   Plan Discussed with: CRNA  Anesthesia Plan Comments:       Anesthesia Quick Evaluation

## 2021-03-11 DIAGNOSIS — S51011D Laceration without foreign body of right elbow, subsequent encounter: Secondary | ICD-10-CM | POA: Diagnosis not present

## 2021-03-11 DIAGNOSIS — Z87891 Personal history of nicotine dependence: Secondary | ICD-10-CM | POA: Diagnosis not present

## 2021-03-11 DIAGNOSIS — S0181XD Laceration without foreign body of other part of head, subsequent encounter: Secondary | ICD-10-CM | POA: Diagnosis not present

## 2021-03-11 DIAGNOSIS — I4891 Unspecified atrial fibrillation: Secondary | ICD-10-CM | POA: Diagnosis not present

## 2021-03-11 DIAGNOSIS — E785 Hyperlipidemia, unspecified: Secondary | ICD-10-CM | POA: Diagnosis not present

## 2021-03-11 DIAGNOSIS — C61 Malignant neoplasm of prostate: Secondary | ICD-10-CM | POA: Diagnosis not present

## 2021-03-11 DIAGNOSIS — S82034D Nondisplaced transverse fracture of right patella, subsequent encounter for closed fracture with routine healing: Secondary | ICD-10-CM | POA: Diagnosis not present

## 2021-03-11 DIAGNOSIS — G4730 Sleep apnea, unspecified: Secondary | ICD-10-CM | POA: Diagnosis not present

## 2021-03-11 DIAGNOSIS — Z7901 Long term (current) use of anticoagulants: Secondary | ICD-10-CM | POA: Diagnosis not present

## 2021-03-11 DIAGNOSIS — Z9181 History of falling: Secondary | ICD-10-CM | POA: Diagnosis not present

## 2021-03-11 DIAGNOSIS — Z85828 Personal history of other malignant neoplasm of skin: Secondary | ICD-10-CM | POA: Diagnosis not present

## 2021-03-11 NOTE — Discharge Instructions (Signed)

## 2021-03-12 ENCOUNTER — Encounter
Admission: RE | Disposition: A | Discharge status: Home / Self Care | Payer: Self-pay | Source: Home / Self Care | Attending: Otolaryngology

## 2021-03-12 ENCOUNTER — Ambulatory Visit
Admission: RE | Admit: 2021-03-12 | Discharge: 2021-03-12 | Disposition: A | Discharge status: Home / Self Care | Payer: PPO | Attending: Otolaryngology | Admitting: Otolaryngology

## 2021-03-12 ENCOUNTER — Other Ambulatory Visit: Payer: Self-pay

## 2021-03-12 ENCOUNTER — Ambulatory Visit: Payer: PPO | Admitting: Anesthesiology

## 2021-03-12 ENCOUNTER — Encounter: Payer: Self-pay | Admitting: Otolaryngology

## 2021-03-12 DIAGNOSIS — Z7901 Long term (current) use of anticoagulants: Secondary | ICD-10-CM | POA: Diagnosis not present

## 2021-03-12 DIAGNOSIS — Z79899 Other long term (current) drug therapy: Secondary | ICD-10-CM | POA: Insufficient documentation

## 2021-03-12 DIAGNOSIS — Z87891 Personal history of nicotine dependence: Secondary | ICD-10-CM | POA: Insufficient documentation

## 2021-03-12 DIAGNOSIS — S022XXA Fracture of nasal bones, initial encounter for closed fracture: Secondary | ICD-10-CM | POA: Diagnosis not present

## 2021-03-12 DIAGNOSIS — Z85828 Personal history of other malignant neoplasm of skin: Secondary | ICD-10-CM | POA: Diagnosis not present

## 2021-03-12 DIAGNOSIS — Z8546 Personal history of malignant neoplasm of prostate: Secondary | ICD-10-CM | POA: Diagnosis not present

## 2021-03-12 DIAGNOSIS — S02841A Fracture of lateral orbital wall, right side, initial encounter for closed fracture: Secondary | ICD-10-CM | POA: Insufficient documentation

## 2021-03-12 DIAGNOSIS — W19XXXA Unspecified fall, initial encounter: Secondary | ICD-10-CM | POA: Insufficient documentation

## 2021-03-12 DIAGNOSIS — J342 Deviated nasal septum: Secondary | ICD-10-CM | POA: Insufficient documentation

## 2021-03-12 HISTORY — DX: Paroxysmal atrial fibrillation: I48.0

## 2021-03-12 HISTORY — PX: CLOSED REDUCTION NASAL FRACTURE: SHX5365

## 2021-03-12 HISTORY — DX: Sleep apnea, unspecified: G47.30

## 2021-03-12 SURGERY — CLOSED REDUCTION, FRACTURE, NASAL BONE
Anesthesia: General | Site: Nose

## 2021-03-12 MED ORDER — OXYCODONE HCL 5 MG PO TABS
5.0000 mg | ORAL_TABLET | Freq: Once | ORAL | Status: AC
  Administered 2021-03-12: 5 mg via ORAL

## 2021-03-12 MED ORDER — ACETAMINOPHEN 160 MG/5ML PO SOLN: 325.0000 mg | ORAL | Status: DC | PRN

## 2021-03-12 MED ORDER — PROPOFOL 10 MG/ML IV BOLUS
INTRAVENOUS | Status: DC | PRN
  Administered 2021-03-12: 120 mg via INTRAVENOUS

## 2021-03-12 MED ORDER — ONDANSETRON HCL 4 MG/2ML IJ SOLN: 4.0000 mg | Freq: Once | INTRAMUSCULAR | Status: DC | PRN

## 2021-03-12 MED ORDER — DEXAMETHASONE SODIUM PHOSPHATE 4 MG/ML IJ SOLN
INTRAMUSCULAR | Status: DC | PRN
  Administered 2021-03-12: 10 mg via INTRAVENOUS

## 2021-03-12 MED ORDER — LIDOCAINE HCL (CARDIAC) PF 100 MG/5ML IV SOSY
PREFILLED_SYRINGE | INTRAVENOUS | Status: DC | PRN
  Administered 2021-03-12: 50 mg via INTRATRACHEAL

## 2021-03-12 MED ORDER — ACETAMINOPHEN 325 MG PO TABS: 650.0000 mg | ORAL_TABLET | Freq: Once | ORAL | Status: DC | PRN

## 2021-03-12 MED ORDER — ONDANSETRON HCL 4 MG/2ML IJ SOLN
INTRAMUSCULAR | Status: DC | PRN
  Administered 2021-03-12: 4 mg via INTRAVENOUS

## 2021-03-12 MED ORDER — EPHEDRINE SULFATE 50 MG/ML IJ SOLN
INTRAMUSCULAR | Status: DC | PRN
  Administered 2021-03-12: 10 mg via INTRAVENOUS

## 2021-03-12 MED ORDER — FENTANYL CITRATE (PF) 100 MCG/2ML IJ SOLN
INTRAMUSCULAR | Status: DC | PRN
  Administered 2021-03-12: 50 ug via INTRAVENOUS

## 2021-03-12 MED ORDER — GLYCOPYRROLATE 0.2 MG/ML IJ SOLN
INTRAMUSCULAR | Status: DC | PRN
  Administered 2021-03-12: .1 mg via INTRAVENOUS

## 2021-03-12 MED ORDER — LACTATED RINGERS IV SOLN: INTRAVENOUS | Status: DC

## 2021-03-12 MED ORDER — DOXYCYCLINE HYCLATE 100 MG PO CAPS: ORAL_CAPSULE | ORAL | 0 refills | Status: DC

## 2021-03-12 MED ORDER — MIDAZOLAM HCL 5 MG/5ML IJ SOLN
INTRAMUSCULAR | Status: DC | PRN
  Administered 2021-03-12: 1 mg via INTRAVENOUS

## 2021-03-12 MED ORDER — HYDROCODONE-ACETAMINOPHEN 5-325 MG PO TABS: 1.0000 | ORAL_TABLET | Freq: Four times a day (QID) | ORAL | 0 refills | Status: DC | PRN

## 2021-03-12 MED ORDER — OXYMETAZOLINE HCL 0.05 % NA SOLN
NASAL | Status: DC | PRN
  Administered 2021-03-12: 1 via TOPICAL

## 2021-03-12 SURGICAL SUPPLY — 15 items
ADH LQ OCL WTPRF AMP STRL LF (MISCELLANEOUS) ×1
ADHESIVE MASTISOL STRL (MISCELLANEOUS) ×2 IMPLANT
BASIN GRAD PLASTIC 32OZ STRL (MISCELLANEOUS) ×2 IMPLANT
COVER MAYO STAND STRL (DRAPES) ×2 IMPLANT
CUP MEDICINE 2OZ PLAST GRAD ST (MISCELLANEOUS) ×2 IMPLANT
GAUZE SPONGE 4X4 12PLY STRL (GAUZE/BANDAGES/DRESSINGS) ×2 IMPLANT
KIT TURNOVER KIT A (KITS) ×2 IMPLANT
PACKING NASAL EPIS 4X2.4 XEROG (MISCELLANEOUS) ×2 IMPLANT
PACKING SINUS BLU KSTYLE (MISCELLANEOUS) ×1 IMPLANT
PATTIES SURGICAL .5 X3 (DISPOSABLE) ×2 IMPLANT
PK SINUS BLU KSTYLE (MISCELLANEOUS) ×2
STRIP CLOSURE SKIN 1/2X4 (GAUZE/BANDAGES/DRESSINGS) ×2 IMPLANT
TOWEL OR 17X26 4PK STRL BLUE (TOWEL DISPOSABLE) ×2 IMPLANT
TUBING CONN 6MMX3.1M (TUBING) ×1
TUBING SUCTION CONN 0.25 STRL (TUBING) ×1 IMPLANT

## 2021-03-12 NOTE — Op Note (Signed)
03/12/2021 9:32 AM  Joshua Barajas [id number]   Pre-Op Dx: NASAL FRACTURE  Post-op Dx: SAME  Procedure: Closed reduction of nasal fracture   Surgeon: Riley Nearing., MD  Anesthesia: General Endotracheal   EBL: Minimal   Complications: None   Findings: Nasal dorsum deviated to the left with bilateral depressed nasal bone fracture   Procedure: After the patient was identified in holding and the history and physical and consent was reviewed, the patient was taken to the operating room and placed in a supine position. General endotracheal anesthesia was induced in the normal fashion. The patient was draped with the eyes protected. The nose was decongested with Afrin moistened pledgets. After allowing time for decongestion, these were removed.   A Boies elevator was then placed intranasally, and used to manipulate the nasal bone fragments until the nasal dorsum appeared to be midline with no palpable step-off deformity. The fragments were very loose, and required Merocel packing for support, with small Merocel packs placed bilaterally and expanded with Afrin.  Following this, the skin was prepped with Mastisol, and 1/2 Ster-strips applied to the nose. Next an Aquaplast splint was fashioned to fit the nasal dorsum, and placed for protection of the nasal bones during healing. This was further secured with Steri-strips. The care of patient was returned to anesthesia, awakened, and transferred to recovery in stable condition.   Disposition: PACU to home   Plan: Regular diet. Ice pack to nose as needed for pain and swelling. Keep nasal splint in place until follow-up. Limit exercise and strenuous activity for the next week. Recheck my office once week. Take pain med, antibiotics as prescribed.   Riley Nearing., MD  9:32 AM 03/12/2021

## 2021-03-12 NOTE — Anesthesia Postprocedure Evaluation (Signed)
Anesthesia Post Note  Patient: Joshua Barajas  Procedure(s) Performed: CLOSED REDUCTION NASAL FRACTURE (N/A Nose)     Patient location during evaluation: PACU Anesthesia Type: General Level of consciousness: awake and alert, oriented and patient cooperative Pain management: pain level controlled Vital Signs Assessment: post-procedure vital signs reviewed and stable Respiratory status: spontaneous breathing, nonlabored ventilation and respiratory function stable Cardiovascular status: blood pressure returned to baseline and stable Postop Assessment: adequate PO intake Anesthetic complications: no   No complications documented.  Darrin Nipper

## 2021-03-12 NOTE — Anesthesia Procedure Notes (Signed)
Procedure Name: LMA Insertion Date/Time: 03/12/2021 9:11 AM Performed by: Mayme Genta, CRNA Pre-anesthesia Checklist: Patient identified, Emergency Drugs available, Suction available, Timeout performed and Patient being monitored Patient Re-evaluated:Patient Re-evaluated prior to induction Oxygen Delivery Method: Circle system utilized Preoxygenation: Pre-oxygenation with 100% oxygen Induction Type: IV induction LMA: LMA inserted LMA Size: 4.0 Number of attempts: 1 Placement Confirmation: positive ETCO2 and breath sounds checked- equal and bilateral Tube secured with: Tape

## 2021-03-12 NOTE — H&P (Signed)
History and physical reviewed and will be scanned in later. No change in medical status reported by the patient or family, appears stable for surgery. All questions regarding the procedure answered, and patient (or family if a child) expressed understanding of the procedure. ? ?Joshua Barajas ?@TODAY@ ?

## 2021-03-12 NOTE — Transfer of Care (Signed)
Immediate Anesthesia Transfer of Care Note  Patient: Joshua Barajas  Procedure(s) Performed: CLOSED REDUCTION NASAL FRACTURE (N/A Nose)  Patient Location: PACU  Anesthesia Type: General  Level of Consciousness: awake, alert  and patient cooperative  Airway and Oxygen Therapy: Patient Spontanous Breathing and Patient connected to supplemental oxygen  Post-op Assessment: Post-op Vital signs reviewed, Patient's Cardiovascular Status Stable, Respiratory Function Stable, Patent Airway and No signs of Nausea or vomiting  Post-op Vital Signs: Reviewed and stable  Complications: No complications documented.

## 2021-03-13 ENCOUNTER — Encounter: Payer: Self-pay | Admitting: Otolaryngology

## 2021-03-21 DIAGNOSIS — S82001A Unspecified fracture of right patella, initial encounter for closed fracture: Secondary | ICD-10-CM | POA: Diagnosis not present

## 2021-03-22 DIAGNOSIS — Z7901 Long term (current) use of anticoagulants: Secondary | ICD-10-CM | POA: Diagnosis not present

## 2021-03-22 DIAGNOSIS — I4891 Unspecified atrial fibrillation: Secondary | ICD-10-CM | POA: Diagnosis not present

## 2021-03-22 DIAGNOSIS — S82034D Nondisplaced transverse fracture of right patella, subsequent encounter for closed fracture with routine healing: Secondary | ICD-10-CM | POA: Diagnosis not present

## 2021-03-22 DIAGNOSIS — E785 Hyperlipidemia, unspecified: Secondary | ICD-10-CM | POA: Diagnosis not present

## 2021-03-22 DIAGNOSIS — S0181XD Laceration without foreign body of other part of head, subsequent encounter: Secondary | ICD-10-CM | POA: Diagnosis not present

## 2021-03-22 DIAGNOSIS — Z9181 History of falling: Secondary | ICD-10-CM | POA: Diagnosis not present

## 2021-03-22 DIAGNOSIS — Z87891 Personal history of nicotine dependence: Secondary | ICD-10-CM | POA: Diagnosis not present

## 2021-03-22 DIAGNOSIS — G4730 Sleep apnea, unspecified: Secondary | ICD-10-CM | POA: Diagnosis not present

## 2021-03-22 DIAGNOSIS — S51011D Laceration without foreign body of right elbow, subsequent encounter: Secondary | ICD-10-CM | POA: Diagnosis not present

## 2021-03-22 DIAGNOSIS — C61 Malignant neoplasm of prostate: Secondary | ICD-10-CM | POA: Diagnosis not present

## 2021-03-22 DIAGNOSIS — Z85828 Personal history of other malignant neoplasm of skin: Secondary | ICD-10-CM | POA: Diagnosis not present

## 2021-03-26 DIAGNOSIS — E785 Hyperlipidemia, unspecified: Secondary | ICD-10-CM | POA: Diagnosis not present

## 2021-03-26 DIAGNOSIS — I4891 Unspecified atrial fibrillation: Secondary | ICD-10-CM | POA: Diagnosis not present

## 2021-03-26 DIAGNOSIS — G4730 Sleep apnea, unspecified: Secondary | ICD-10-CM | POA: Diagnosis not present

## 2021-03-26 DIAGNOSIS — Z85828 Personal history of other malignant neoplasm of skin: Secondary | ICD-10-CM | POA: Diagnosis not present

## 2021-03-26 DIAGNOSIS — Z87891 Personal history of nicotine dependence: Secondary | ICD-10-CM | POA: Diagnosis not present

## 2021-03-26 DIAGNOSIS — C61 Malignant neoplasm of prostate: Secondary | ICD-10-CM | POA: Diagnosis not present

## 2021-03-26 DIAGNOSIS — Z9181 History of falling: Secondary | ICD-10-CM | POA: Diagnosis not present

## 2021-03-26 DIAGNOSIS — S0181XD Laceration without foreign body of other part of head, subsequent encounter: Secondary | ICD-10-CM | POA: Diagnosis not present

## 2021-03-26 DIAGNOSIS — S51011D Laceration without foreign body of right elbow, subsequent encounter: Secondary | ICD-10-CM | POA: Diagnosis not present

## 2021-03-26 DIAGNOSIS — Z7901 Long term (current) use of anticoagulants: Secondary | ICD-10-CM | POA: Diagnosis not present

## 2021-03-26 DIAGNOSIS — S82034D Nondisplaced transverse fracture of right patella, subsequent encounter for closed fracture with routine healing: Secondary | ICD-10-CM | POA: Diagnosis not present

## 2021-04-15 DIAGNOSIS — M25661 Stiffness of right knee, not elsewhere classified: Secondary | ICD-10-CM | POA: Diagnosis not present

## 2021-04-15 DIAGNOSIS — S82001D Unspecified fracture of right patella, subsequent encounter for closed fracture with routine healing: Secondary | ICD-10-CM | POA: Diagnosis not present

## 2021-04-18 DIAGNOSIS — S82001D Unspecified fracture of right patella, subsequent encounter for closed fracture with routine healing: Secondary | ICD-10-CM | POA: Diagnosis not present

## 2021-04-19 DIAGNOSIS — S82001D Unspecified fracture of right patella, subsequent encounter for closed fracture with routine healing: Secondary | ICD-10-CM | POA: Diagnosis not present

## 2021-04-19 DIAGNOSIS — M25661 Stiffness of right knee, not elsewhere classified: Secondary | ICD-10-CM | POA: Diagnosis not present

## 2021-04-24 DIAGNOSIS — S82001D Unspecified fracture of right patella, subsequent encounter for closed fracture with routine healing: Secondary | ICD-10-CM | POA: Diagnosis not present

## 2021-04-24 DIAGNOSIS — M25661 Stiffness of right knee, not elsewhere classified: Secondary | ICD-10-CM | POA: Diagnosis not present

## 2021-05-01 DIAGNOSIS — S82001D Unspecified fracture of right patella, subsequent encounter for closed fracture with routine healing: Secondary | ICD-10-CM | POA: Diagnosis not present

## 2021-05-01 DIAGNOSIS — M25661 Stiffness of right knee, not elsewhere classified: Secondary | ICD-10-CM | POA: Diagnosis not present

## 2021-05-03 DIAGNOSIS — M25661 Stiffness of right knee, not elsewhere classified: Secondary | ICD-10-CM | POA: Diagnosis not present

## 2021-05-03 DIAGNOSIS — S82001D Unspecified fracture of right patella, subsequent encounter for closed fracture with routine healing: Secondary | ICD-10-CM | POA: Diagnosis not present

## 2021-05-07 DIAGNOSIS — S82001D Unspecified fracture of right patella, subsequent encounter for closed fracture with routine healing: Secondary | ICD-10-CM | POA: Diagnosis not present

## 2021-05-07 DIAGNOSIS — M25661 Stiffness of right knee, not elsewhere classified: Secondary | ICD-10-CM | POA: Diagnosis not present

## 2021-05-09 ENCOUNTER — Other Ambulatory Visit: Payer: Self-pay

## 2021-05-09 DIAGNOSIS — C61 Malignant neoplasm of prostate: Secondary | ICD-10-CM

## 2021-05-10 ENCOUNTER — Other Ambulatory Visit: Payer: PPO

## 2021-05-10 ENCOUNTER — Other Ambulatory Visit: Payer: Self-pay

## 2021-05-10 DIAGNOSIS — C61 Malignant neoplasm of prostate: Secondary | ICD-10-CM

## 2021-05-10 DIAGNOSIS — S82001D Unspecified fracture of right patella, subsequent encounter for closed fracture with routine healing: Secondary | ICD-10-CM | POA: Diagnosis not present

## 2021-05-10 DIAGNOSIS — M25661 Stiffness of right knee, not elsewhere classified: Secondary | ICD-10-CM | POA: Diagnosis not present

## 2021-05-11 LAB — PSA: Prostate Specific Ag, Serum: <0.1 ng/mL (ref 0.0–4.0)

## 2021-05-14 DIAGNOSIS — S82001D Unspecified fracture of right patella, subsequent encounter for closed fracture with routine healing: Secondary | ICD-10-CM | POA: Diagnosis not present

## 2021-05-14 DIAGNOSIS — M25661 Stiffness of right knee, not elsewhere classified: Secondary | ICD-10-CM | POA: Diagnosis not present

## 2021-05-14 NOTE — Progress Notes (Signed)
05/15/2021 9:21 AM   Joshua Barajas May 28, 1951 [id number]  Referring provider: [name], MD [facility] [place],  [place] [address]  Urological history: 1. Prostate cancer -PSA <0.1 in 04/2021 -underwent prostate biopsy 2016 for an accelerating PSA velocity most recently to 3.2 -initial prostate biopsy 2/16 revealed Gleason 3+3 prostate cancer involving 6 of 12 cores bilaterally, 4 on the LEFT (lateral base, lateral mid, and lateral apex) and 2 on the RIGHT (base up to 83% tissue).    His prostate exam was fairly unremarkable, diffusely firm.  TRUS vol 26 cc.    -Repeat prostate biopsy on 04/13/15 showed Gleason 3+3 in 6/12 cores, 4 on the right and 2 on the left this time involving up on 33% at the left apex but otherwise farily low volume diease -transferred his care to [facility] and elected to undergo CyberKnife radiation (36 Gy completed 10/09/2015)   2. Nocturia -Risk factors for nocturia: obstructive sleep apnea and hypertension -uses CPAP  Chief Complaint  Patient presents with   Follow-up     HPI: Joshua Barajas is a 70 y.o. male who presents today for yearly follow up.   He is still having nocturia x 3.  He is sleeping with the CPAP, but he is consuming fluids in the evenings.  He is also having some urge/stress incontinence issues.  He does not find this bothersome at this time.  He falls asleep easily after an episode of nocturia and the incontinence is mild.   Patient denies any modifying or aggravating factors.  Patient denies any gross hematuria, dysuria or suprapubic/flank pain.  Patient denies any fevers, chills, nausea or vomiting.    PMH: Past Medical History:  Diagnosis Date   Dysrhythmia    ED (erectile dysfunction)    Hematuria    HLD (hyperlipidemia)    Over weight    Paroxysmal atrial fibrillation (HCC)    Prostate cancer ([place])    Rising PSA level    Sleep apnea    CPAP   Squamous cell carcinoma     Surgical History: Past Surgical  History:  Procedure Laterality Date   CATARACT EXTRACTION Bilateral    CLOSED REDUCTION NASAL FRACTURE N/A 03/12/2021   Procedure: CLOSED REDUCTION NASAL FRACTURE;  Surgeon: Clyde Canterbury, MD;  Location: [place];  Service: ENT;  Laterality: N/A;  sleep apnea   COLONOSCOPY     COLONOSCOPY WITH PROPOFOL N/A 09/28/2020   Procedure: COLONOSCOPY WITH PROPOFOL;  Surgeon: Lesly Rubenstein, MD;  Location: [facility] ENDOSCOPY;  Service: Endoscopy;  Laterality: N/A;   EYE SURGERY     SEPTOPLASTY     TONSILECTOMY, ADENOIDECTOMY, BILATERAL MYRINGOTOMY AND TUBES     TONSILLECTOMY      Home Medications:  Allergies as of 05/15/2021       Reactions   Latex Rash   Bandaids(only) if left on for a few days.   Other Rash   If leaves on for few days        Medication List        Accurate as of May 15, 2021  9:21 AM. If you have any questions, ask your nurse or doctor.          apixaban 5 MG Tabs tablet Commonly known as: ELIQUIS Take 1 tablet (5 mg total) by mouth 2 (two) times daily.   diltiazem 240 MG 24 hr capsule Commonly known as: CARDIZEM CD Take 1 capsule (240 mg total) by mouth daily.   doxycycline 100  MG capsule Commonly known as: VIBRAMYCIN 100 mg PO BID x 10 days   HYDROcodone-acetaminophen 5-325 MG tablet Commonly known as: NORCO/VICODIN Take 1-2 tablets by mouth every 6 (six) hours as needed for moderate pain.   metoprolol tartrate 25 MG tablet Commonly known as: LOPRESSOR Take 1 tablet (25 mg total) by mouth 2 (two) times daily.        Allergies:  Allergies  Allergen Reactions   Latex Rash    Bandaids(only) if left on for a few days.   Other Rash    If leaves on for few days    Family History: Family History  Problem Relation Age of Onset   Heart attack Father    Rheum arthritis Father    Heart disease Paternal Uncle     Social History:  reports that he quit smoking about 45 years ago. His smoking use included cigarettes. He has never  used smokeless tobacco. He reports that he does not drink alcohol and does not use drugs.  ROS: For pertinent review of systems please refer to history of present illness  Physical Exam: BP (!) 101/58   Pulse (!) 56   Ht 6' 3" (1.905 m)   Wt 223 lb (101.2 kg)   BMI 27.87 kg/m   Constitutional:  Well nourished. Alert and oriented, No acute distress. HEENT: [place] AT, mask in place.  Trachea midline Cardiovascular: No clubbing, cyanosis, or edema. Respiratory: Normal respiratory effort, no increased work of breathing. Neurologic: Grossly intact, no focal deficits, moving all 4 extremities. Psychiatric: Normal mood and affect.   Laboratory Data: Component     Latest Ref Rng & Units 05/10/2021  Prostate Specific Ag, Serum     0.0 - 4.0 ng/mL <0.1   WBC (White Blood Cell Count) 4.1 - 10.2 10^3/uL 4.8   RBC (Red Blood Cell Count) 4.69 - 6.13 10^6/uL 3.92 Low    Hemoglobin 14.1 - 18.1 gm/dL 12.9 Low    Hematocrit 40.0 - 52.0 % 38.0 Low    MCV (Mean Corpuscular Volume) 80.0 - 100.0 fl 96.9   MCH (Mean Corpuscular Hemoglobin) 27.0 - 31.2 pg 32.9 High    MCHC (Mean Corpuscular Hemoglobin Concentration) 32.0 - 36.0 gm/dL 33.9   Platelet Count 150 - 450 10^3/uL 144 Low    RDW-CV (Red Cell Distribution Width) 11.6 - 14.8 % 12.8   MPV (Mean Platelet Volume) 9.4 - 12.4 fl 10.4   Neutrophils 1.50 - 7.80 10^3/uL 2.70   Lymphocytes 1.00 - 3.60 10^3/uL 1.40   Mixed Count 0.10 - 0.90 10^3/uL 0.70   Neutrophil % 32.0 - 70.0 % 55.5   Lymphocyte % 10.0 - 50.0 % 29.9   Mixed % 3.0 - 14.4 % 14.6 High    Resulting Agency  [facility] - [facility]  Specimen Collected: 11/08/20 08:56 Last Resulted: 11/08/20 11:01  Received From: [facility]  Result Received: 02/20/21 11:06   Glucose 70 - 110 mg/dL 101   Sodium 136 - 145 mmol/L 139   Potassium 3.6 - 5.1 mmol/L 4.7   Chloride 97 - 109 mmol/L 105   Carbon Dioxide (CO2) 22.0 - 32.0 mmol/L 31.9   Urea Nitrogen (BUN) 7 - 25 mg/dL 11    Creatinine 0.7 - 1.3 mg/dL 0.8   Glomerular Filtration Rate (eGFR), MDRD Estimate >60 mL/min/1.73sq m 96   Calcium 8.7 - 10.3 mg/dL 9.2   AST  8 - 39 U/L 16   ALT  6 - 57 U/L 12   Alk  Phos (alkaline Phosphatase) 34 - 104 U/L 52   Albumin 3.5 - 4.8 g/dL 4.0   Bilirubin, Total 0.3 - 1.2 mg/dL 0.7   Protein, Total 6.1 - 7.9 g/dL 6.9   A/G Ratio 1.0 - 5.0 gm/dL 1.4   Resulting Agency  [facility] - [facility]  Specimen Collected: 11/08/20 08:56 Last Resulted: 11/08/20 14:03  Received From: [facility]  Result Received: 02/20/21 11:06   Cholesterol, Total 100 - 200 mg/dL 159   Triglyceride 35 - 199 mg/dL 91   HDL (High Density Lipoprotein) Cholesterol 29.0 - 71.0 mg/dL 37.3   LDL Calculated 0 - 130 mg/dL 104   VLDL Cholesterol mg/dL 18   Cholesterol/HDL Ratio  4.3   Resulting Agency  [facility] - [facility]  Specimen Collected: 11/08/20 08:56 Last Resulted: 11/08/20 14:07  Received From: [facility]  Result Received: 02/20/21 11:06   Thyroid Stimulating Hormone (TSH) 0.450-5.330 uIU/ml uIU/mL 3.360   Resulting Agency  [facility] - [facility]  Specimen Collected: 11/08/20 08:56 Last Resulted: 11/08/20 14:35  Received From: [facility]  Result Received: 02/20/21 11:06  I have reviewed the labs.    Assessment & Plan:    1. Prostate cancer -PSA remains undetectable -PSA reached nadir in 2019 at <0.10  2. Nocturia -sleeping with CPAP -not bothersome at this time as he falls asleep easily after waking -he also realizes that drinking fluids in the evening contributes to night time urination  3. Mixed incontinence -not bothersome at this time as it is mild -he will contact us if it worsen  Return in about 6 months (around 11/15/2021) for PSA and office visit .  Zara Council, PA-C  [facility] [address] [place], [place] [address] [phone]

## 2021-05-15 ENCOUNTER — Encounter: Payer: Self-pay | Admitting: Urology

## 2021-05-15 ENCOUNTER — Other Ambulatory Visit: Payer: Self-pay

## 2021-05-15 ENCOUNTER — Ambulatory Visit: Payer: PPO | Admitting: Urology

## 2021-05-15 VITALS — BP 101/58 | HR 56 | Ht 75.0 in | Wt 223.0 lb

## 2021-05-15 DIAGNOSIS — R351 Nocturia: Secondary | ICD-10-CM

## 2021-05-15 DIAGNOSIS — C61 Malignant neoplasm of prostate: Secondary | ICD-10-CM

## 2021-05-17 DIAGNOSIS — M25661 Stiffness of right knee, not elsewhere classified: Secondary | ICD-10-CM | POA: Diagnosis not present

## 2021-05-17 DIAGNOSIS — S82001D Unspecified fracture of right patella, subsequent encounter for closed fracture with routine healing: Secondary | ICD-10-CM | POA: Diagnosis not present

## 2021-08-05 ENCOUNTER — Other Ambulatory Visit: Payer: Self-pay

## 2021-08-05 ENCOUNTER — Ambulatory Visit (INDEPENDENT_AMBULATORY_CARE_PROVIDER_SITE_OTHER): Payer: PPO | Admitting: Internal Medicine

## 2021-08-05 VITALS — BP 106/65 | HR 54 | Resp 16 | Ht 75.0 in | Wt 226.3 lb

## 2021-08-05 DIAGNOSIS — I4891 Unspecified atrial fibrillation: Secondary | ICD-10-CM

## 2021-08-05 DIAGNOSIS — Z7189 Other specified counseling: Secondary | ICD-10-CM | POA: Diagnosis not present

## 2021-08-05 DIAGNOSIS — Z9989 Dependence on other enabling machines and devices: Secondary | ICD-10-CM

## 2021-08-05 DIAGNOSIS — G4733 Obstructive sleep apnea (adult) (pediatric): Secondary | ICD-10-CM

## 2021-08-05 NOTE — Patient Instructions (Signed)

## 2021-08-05 NOTE — Progress Notes (Signed)
[facility] [place], [place] [address]  Pulmonary Sleep Medicine   Office Visit Note  Patient Name: Joshua Barajas DOB: 1951/08/06 MRN [id number]    Chief Complaint: Obstructive Sleep Apnea visit  Brief History:  Brysun is seen today for one year follow up The patient has a 2 year history of sleep apnea an d is currently on CPAP @ 9cm H2O. . Patient is using PAP nightly.  The patient feels ok after sleeping with PAP.  The patient reports doing better from PAP use. Reported sleepiness is  improved and the Epworth Sleepiness Score is 1 out of 24. The patient occasionally take naps approx 30 40 minutes. The patient complains of the following: not being able to sleep due to back pain  The compliance download shows  compliance with an average use time of 7:07 hours. @ 86% The AHI is 1.4  The patient does not complain of limb movements disrupting sleep.  ROS  General: (-) fever, (-) chills, (-) night sweat Nose and Sinuses: (-) nasal stuffiness or itchiness, (-) postnasal drip, (-) nosebleeds, (-) sinus trouble. Mouth and Throat: (-) sore throat, (-) hoarseness. Neck: (-) swollen glands, (-) enlarged thyroid, (-) neck pain. Respiratory: - cough, - shortness of breath, - wheezing. Neurologic: - numbness, - tingling. Psychiatric: - anxiety, - depression   Current Medication: Outpatient Encounter Medications as of 08/05/2021  Medication Sig   apixaban (ELIQUIS) 5 MG TABS tablet Take 1 tablet (5 mg total) by mouth 2 (two) times daily.   diltiazem (CARDIZEM CD) 240 MG 24 hr capsule Take 1 capsule (240 mg total) by mouth daily.   doxycycline (VIBRAMYCIN) 100 MG capsule 100 mg PO BID x 10 days (Patient not taking: Reported on 05/15/2021)   HYDROcodone-acetaminophen (NORCO/VICODIN) 5-325 MG tablet Take 1-2 tablets by mouth every 6 (six) hours as needed for moderate pain. (Patient not taking: Reported on 05/15/2021)   metoprolol tartrate (LOPRESSOR) 25 MG tablet Take 1  tablet (25 mg total) by mouth 2 (two) times daily.   No facility-administered encounter medications on file as of 08/05/2021.    Surgical History: Past Surgical History:  Procedure Laterality Date   CATARACT EXTRACTION Bilateral    CLOSED REDUCTION NASAL FRACTURE N/A 03/12/2021   Procedure: CLOSED REDUCTION NASAL FRACTURE;  Surgeon: Clyde Canterbury, MD;  Location: [place];  Service: ENT;  Laterality: N/A;  sleep apnea   COLONOSCOPY     COLONOSCOPY WITH PROPOFOL N/A 09/28/2020   Procedure: COLONOSCOPY WITH PROPOFOL;  Surgeon: Lesly Rubenstein, MD;  Location: [facility] END[facility];  Service: Endoscopy;  Laterality: N/A;   EYE SURGERY     SEPTOPLASTY     TONSILECTOMY, ADENOIDECTOMY, BILATERAL MYRINGOTOMY AND TUBES     TONSILLECTOMY      Medical History: Past Medical History:  Diagnosis Date   Dysrhythmia    ED (erectile dysfunction)    Hematuria    HLD (hyperlipidemia)    Over weight    Paroxysmal atrial fibrillation (HCC)    Prostate cancer (HCC)    Rising PSA level    Sleep apnea    CPAP   Squamous cell carcinoma     Family History: Non contributory to the present illness  Social History: Social History   Socioeconomic History   Marital status: Married    Spouse name: Not on file   Number of children: Not on file   Years of education: Not on file   Highest education level: Not on file  Occupational History  Occupation: retired  Tobacco Use   Smoking status: Former    Types: Cigarettes    Quit date: 04/10/1976    Years since quitting: 45.3   Smokeless tobacco: Never  Vaping Use   Vaping Use: Never used  Substance and Sexual Activity   Alcohol use: No   Drug use: No   Sexual activity: Not on file  Other Topics Concern   Not on file  Social History Narrative   Not on file   Social Determinants of Health   Financial Resource Strain: Not on file  Food Insecurity: Not on file  Transportation Needs: Not on file  Physical Activity: Not on file   Stress: Not on file  Social Connections: Not on file  Intimate Partner Violence: Not on file    Vital Signs: There were no vitals taken for this visit.  Examination: General Appearance: The patient is well-developed, well-nourished, and in no distress. Neck Circumference: 40.5 cm Skin: Gross inspection of skin unremarkable. Head: normocephalic, no gross deformities. Eyes: no gross deformities noted. ENT: ears appear grossly normal Neurologic: Alert and oriented. No involuntary movements.    EPWORTH SLEEPINESS SCALE:  Scale:  (0)= no chance of dozing; (1)= slight chance of dozing; (2)= moderate chance of dozing; (3)= high chance of dozing  Chance  Situtation    Sitting and reading: 1    Watching TV: 0    Sitting Inactive in public: 0    As a passenger in car: 0      Lying down to rest: 0    Sitting and talking: 0    Sitting quielty after lunch: 0    In a car, stopped in traffic: 0   TOTAL SCORE:   1 out of 24    SLEEP STUDIES:  PSG 11/15/18 AHI 5 SpO71min 89%   CPAP COMPLIANCE DATA:  Date Range: 08/05/20 - 08/04/21  Average Daily Use: 7:07 hours  Median Use: 8:04 hours  Compliance for > 4 Hours: 86%  AHI: 1.4 respiratory events per hour  Days Used: 337/365  Mask Leak: 30.4 lpm  95th Percentile Pressure: 9 cmH2O         LABS: Recent Results (from the past 2160 hour(s))  PSA     Status: None   Collection Time: 05/10/21  8:43 AM  Result Value Ref Range   Prostate Specific Ag, Serum <0.1 0.0 - 4.0 ng/mL    Comment: **Verified by repeat analysis** Roche ECLIA methodology. According to the American Urological Association, Serum PSA should decrease and remain at undetectable levels after radical prostatectomy. The AUA defines biochemical recurrence as an initial PSA value 0.2 ng/mL or greater followed by a subsequent confirmatory PSA value 0.2 ng/mL or greater. Values obtained with different assay methods or kits cannot be  used interchangeably. Results cannot be interpreted as absolute evidence of the presence or absence of malignant disease.     Radiology: No results found.  No results found.  No results found.    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 OSA on CPAP The patient does tolerate PAP and reports  benefit from PAP use. The patient was reminded how to clean equipment and advised to replace supplies routinely. The patient was also counselled on weight loss. The compliance is very good.  The AHI is 1.4.   OSA- continue very good compliance. Work on leak. Consider mask fit.    2. CPAP use counseling CPAP Counseling: had a lengthy discussion with the patient regarding the importance of PAP therapy in management of the sleep apnea. Patient appears to understand the risk factor reduction and also understands the risks associated with untreated sleep apnea. Patient will try to make a good faith effort to remain compliant with therapy. Also instructed the patient on proper cleaning of the device including the water must be changed daily if possible and use of distilled water is preferred. Patient understands that the machine should be regularly cleaned with appropriate recommended cleaning solutions that do not damage the PAP machine for example given white vinegar and water rinses. Other methods such as ozone treatment may not be as good as these simple methods to achieve cleaning.   3. Atrial fibrillation, unspecified type ([place]) Reminded pt of importance of treating osa in light of same.     General Counseling: I have discussed the findings of the evaluation and examination with Marisa.  I have also discussed any further diagnostic evaluation thatmay be needed or ordered  today. Govind verbalizes understanding of the findings of todays visit. We also reviewed his medications today and discussed drug interactions and side effects including but not limited excessive drowsiness and altered mental states. We also discussed that there is always a risk not just to him but also people around him. he has been encouraged to call the office with any questions or concerns that should arise related to todays visit.  No orders of the defined types were placed in this encounter.       I have personally obtained a history, examined the patient, evaluated laboratory and imaging results, formulated the assessment and plan and placed orders.   This patient was seen today by Tressie Ellis, PA-C in collaboration with Dr. Devona Konig.     Allyne Gee, MD [facility] Diplomate ABMS Pulmonary and Critical Care Medicine Sleep medicine

## 2021-09-09 DIAGNOSIS — G4733 Obstructive sleep apnea (adult) (pediatric): Secondary | ICD-10-CM | POA: Diagnosis not present

## 2021-09-23 DIAGNOSIS — D2261 Melanocytic nevi of right upper limb, including shoulder: Secondary | ICD-10-CM | POA: Diagnosis not present

## 2021-09-23 DIAGNOSIS — D225 Melanocytic nevi of trunk: Secondary | ICD-10-CM | POA: Diagnosis not present

## 2021-09-23 DIAGNOSIS — D2262 Melanocytic nevi of left upper limb, including shoulder: Secondary | ICD-10-CM | POA: Diagnosis not present

## 2021-09-23 DIAGNOSIS — Z85828 Personal history of other malignant neoplasm of skin: Secondary | ICD-10-CM | POA: Diagnosis not present

## 2021-09-23 DIAGNOSIS — D2272 Melanocytic nevi of left lower limb, including hip: Secondary | ICD-10-CM | POA: Diagnosis not present

## 2021-09-23 DIAGNOSIS — L570 Actinic keratosis: Secondary | ICD-10-CM | POA: Diagnosis not present

## 2021-11-12 ENCOUNTER — Other Ambulatory Visit: Payer: PPO

## 2021-11-12 ENCOUNTER — Other Ambulatory Visit: Payer: Self-pay

## 2021-11-12 DIAGNOSIS — C61 Malignant neoplasm of prostate: Secondary | ICD-10-CM | POA: Diagnosis not present

## 2021-11-12 DIAGNOSIS — E7801 Familial hypercholesterolemia: Secondary | ICD-10-CM | POA: Diagnosis not present

## 2021-11-12 DIAGNOSIS — I480 Paroxysmal atrial fibrillation: Secondary | ICD-10-CM | POA: Diagnosis not present

## 2021-11-12 DIAGNOSIS — N529 Male erectile dysfunction, unspecified: Secondary | ICD-10-CM | POA: Diagnosis not present

## 2021-11-12 DIAGNOSIS — G4733 Obstructive sleep apnea (adult) (pediatric): Secondary | ICD-10-CM | POA: Diagnosis not present

## 2021-11-13 LAB — PSA: Prostate Specific Ag, Serum: <0.1 ng/mL (ref 0.0–4.0)

## 2021-11-14 DIAGNOSIS — Z0000 Encounter for general adult medical examination without abnormal findings: Secondary | ICD-10-CM | POA: Diagnosis not present

## 2021-11-14 DIAGNOSIS — D649 Anemia, unspecified: Secondary | ICD-10-CM | POA: Diagnosis not present

## 2021-11-14 DIAGNOSIS — I480 Paroxysmal atrial fibrillation: Secondary | ICD-10-CM | POA: Diagnosis not present

## 2021-11-14 DIAGNOSIS — I77811 Abdominal aortic ectasia: Secondary | ICD-10-CM | POA: Diagnosis not present

## 2021-11-14 DIAGNOSIS — C61 Malignant neoplasm of prostate: Secondary | ICD-10-CM | POA: Diagnosis not present

## 2021-11-14 DIAGNOSIS — G4733 Obstructive sleep apnea (adult) (pediatric): Secondary | ICD-10-CM | POA: Diagnosis not present

## 2021-11-14 NOTE — Progress Notes (Signed)
11/15/2021 10:16 AM   Joshua Barajas Apr 24, 1951 [id number]  Referring provider: [name], MD Joshua Barajas,  [place] [address]  Urological history: 1. Prostate cancer -PSA <0.1 10/2021 -underwent prostate biopsy 2016 for an accelerating PSA velocity most recently to 3.2 -initial prostate biopsy 2/16 revealed Gleason 3+3 prostate cancer involving 6 of 12 cores bilaterally, 4 on the LEFT (lateral base, lateral mid, and lateral apex) and 2 on the RIGHT (base up to 83% tissue).    His prostate exam was fairly unremarkable, diffusely firm.  TRUS vol 26 cc.    -Repeat prostate biopsy on 04/13/15 showed Gleason 3+3 in 6/12 cores, 4 on the right and 2 on the left this time involving up on 33% at the left apex but otherwise farily low volume diease -transferred his care to [facility] and elected to undergo CyberKnife radiation (36 Gy completed 10/09/2015)   2. Nocturia -Risk factors for nocturia: obstructive sleep apnea and hypertension -uses CPAP  Chief Complaint  Patient presents with   Prostate Cancer     HPI: Joshua Barajas is a 71 y.o. male who presents today for yearly follow up.   He is compliant with CPAP use, but he still has nocturia x2-3.  He does attribute this to lower extremity edema which resolves when he wakes up in the morning.  He also has urge incontinence, but only when he holds the urine for long periods of time.  Patient denies any modifying or aggravating factors.  Patient denies any gross hematuria, dysuria or suprapubic/flank pain.  Patient denies any fevers, chills, nausea or vomiting.     PMH: Past Medical History:  Diagnosis Date   Dysrhythmia    ED (erectile dysfunction)    Hematuria    HLD (hyperlipidemia)    Over weight    Paroxysmal atrial fibrillation (HCC)    Prostate cancer ([facility])    Rising PSA level    Sleep apnea    CPAP   Squamous cell carcinoma     Surgical History: Past Surgical History:  Procedure Laterality Date    CATARACT EXTRACTION Bilateral    CLOSED REDUCTION NASAL FRACTURE N/A 03/12/2021   Procedure: CLOSED REDUCTION NASAL FRACTURE;  Surgeon: Clyde Canterbury, MD;  Location: [facility];  Service: ENT;  Laterality: N/A;  sleep apnea   COLONOSCOPY     COLONOSCOPY WITH PROPOFOL N/A 09/28/2020   Procedure: COLONOSCOPY WITH PROPOFOL;  Surgeon: Lesly Rubenstein, MD;  Location: [facility] ENDOSCOPY;  Service: Endoscopy;  Laterality: N/A;   EYE SURGERY     SEPTOPLASTY     TONSILECTOMY, ADENOIDECTOMY, BILATERAL MYRINGOTOMY AND TUBES     TONSILLECTOMY      Home Medications:  Allergies as of 11/15/2021       Reactions   Latex Rash   Bandaids(only) if left on for a few days.   Other Rash   If leaves on for few days        Medication List        Accurate as of November 15, 2021 10:16 AM. If you have any questions, ask your nurse or doctor.          apixaban 5 MG Tabs tablet Commonly known as: ELIQUIS Take 1 tablet (5 mg total) by mouth 2 (two) times daily.   diltiazem 180 MG 24 hr capsule Commonly known as: CARDIZEM CD What changed: Another medication with the same name was removed. Continue taking this medication, and follow the directions you see here. Changed  by: Melvia Matousek, PA-C   metoprolol tartrate 25 MG tablet Commonly known as: LOPRESSOR Take 1 tablet (25 mg total) by mouth 2 (two) times daily.        Allergies:  Allergies  Allergen Reactions   Latex Rash    Bandaids(only) if left on for a few days.   Other Rash    If leaves on for few days    Family History: Family History  Problem Relation Age of Onset   Heart attack Father    Rheum arthritis Father    Heart disease Paternal Uncle     Social History:  reports that he quit smoking about 45 years ago. His smoking use included cigarettes. He has never used smokeless tobacco. He reports that he does not drink alcohol and does not use drugs.  ROS: For pertinent review of systems please refer to history  of present illness  Physical Exam: BP 117/85    Pulse 69    Ht _0  (1.905 m)    Wt 226 lb (102.5 kg)    BMI 28.25 kg/m   Constitutional:  Well nourished. Alert and oriented, No acute distress. HEENT: [place] AT, mask in place.  Trachea midline Cardiovascular: No clubbing, cyanosis, or edema. Respiratory: Normal respiratory effort, no increased work of breathing. Neurologic: Grossly intact, no focal deficits, moving all 4 extremities. Psychiatric: Normal mood and affect.   Laboratory Data: Component     Latest Ref Rng & Units 05/04/2018 11/03/2018 05/10/2019 05/10/2020  Prostate Specific Ag, Serum     0.0 - 4.0 ng/mL <0.1 <0.1 <0.1 <0.1   Component     Latest Ref Rng & Units 05/10/2021 11/12/2021  Prostate Specific Ag, Serum     0.0 - 4.0 ng/mL <0.1 <0.1   WBC (White Blood Cell Count) 4.1 - 10.2 103/uL 4.3   RBC (Red Blood Cell Count) 4.69 - 6.13 106/uL 3.94 Low    Hemoglobin 14.1 - 18.1 gm/dL 12.9 Low    Hematocrit 40.0 - 52.0 % 37.5 Low    MCV (Mean Corpuscular Volume) 80.0 - 100.0 fl 95.2   MCH (Mean Corpuscular Hemoglobin) 27.0 - 31.2 pg 32.7 High    MCHC (Mean Corpuscular Hemoglobin Concentration) 32.0 - 36.0 gm/dL 34.4   Platelet Count 150 - 450 103/uL 136 Low    RDW-CV (Red Cell Distribution Width) 11.6 - 14.8 % 12.6   MPV (Mean Platelet Volume) 9.4 - 12.4 fl 10.7   Neutrophils 1.50 - 7.80 103/uL 2.20   Lymphocytes 1.00 - 3.60 103/uL 1.40   Mixed Count 0.10 - 0.90 103/uL 0.70   Neutrophil % 32.0 - 70.0 % 51.4   Lymphocyte % 10.0 - 50.0 % 33.1   Mixed % 3.0 - 14.4 % 15.5 High    Resulting Agency  [facility] - [facility]  Specimen Collected: 11/14/21 08:53 Last Resulted: 11/14/21 09:53  Received From: [facility]  Result Received: 11/14/21 13:42   Glucose 70 - 110 mg/dL 101   Sodium 136 - 145 mmol/L 141   Potassium 3.6 - 5.1 mmol/L 4.7   Chloride 97 - 109 mmol/L 106   Carbon Dioxide (CO2) 22.0 - 32.0 mmol/L 30.7   Urea Nitrogen (BUN) 7 - 25  mg/dL 10   Creatinine 0.7 - 1.3 mg/dL 0.9   Glomerular Filtration Rate (eGFR), MDRD Estimate >60 mL/min/1.73sq m 83   Calcium 8.7 - 10.3 mg/dL 9.4   AST  8 - 39 U/L 17   ALT  6 - 57 U/L  13   Alk Phos (alkaline Phosphatase) 34 - 104 U/L 57   Albumin 3.5 - 4.8 g/dL 4.0   Bilirubin, Total 0.3 - 1.2 mg/dL 0.6   Protein, Total 6.1 - 7.9 g/dL 6.8   A/G Ratio 1.0 - 5.0 gm/dL 1.4   Resulting Agency  [facility] - [facility]  Specimen Collected: 11/14/21 08:53 Last Resulted: 11/14/21 13:30  Received From: [facility]  Result Received: 11/14/21 13:42   Cholesterol, Total 100 - 200 mg/dL 157   Triglyceride 35 - 199 mg/dL 54   HDL (High Density Lipoprotein) Cholesterol 29.0 - 71.0 mg/dL 43.6   LDL Calculated 0 - 130 mg/dL 103   VLDL Cholesterol mg/dL 11   Cholesterol/HDL Ratio  3.6   Resulting Agency  [facility] - [facility]  Specimen Collected: 11/14/21 08:53 Last Resulted: 11/14/21 13:30  Received From: [facility]  Result Received: 11/14/21 13:42   Thyroid Stimulating Hormone (TSH) 0.450-5.330 uIU/ml uIU/mL 2.448   Resulting Agency  [facility] - [facility]  Specimen Collected: 11/14/21 08:53 Last Resulted: 11/14/21 13:30  Received From: [facility]  Result Received: 11/14/21 13:42  I have reviewed the labs.    Assessment & Plan:    1. Prostate cancer -PSA remains undetectable  2. Nocturia -sleeping with CPAP -has lower extremity edema -not bothersome  3. Mixed incontinence -only occurs when he holds bladder for long periods of time -not bothersome  Return in about 1 year (around 11/15/2022) for PSA and office visit .  Zara Council, PA-C  [facility] [address], [place], [place] [address] [phone]

## 2021-11-15 ENCOUNTER — Encounter: Payer: Self-pay | Admitting: Urology

## 2021-11-15 ENCOUNTER — Ambulatory Visit: Payer: PPO | Admitting: Urology

## 2021-11-15 ENCOUNTER — Other Ambulatory Visit: Payer: Self-pay

## 2021-11-15 VITALS — BP 117/85 | HR 69 | Ht 75.0 in | Wt 226.0 lb

## 2021-11-15 DIAGNOSIS — C61 Malignant neoplasm of prostate: Secondary | ICD-10-CM

## 2021-11-15 DIAGNOSIS — R351 Nocturia: Secondary | ICD-10-CM | POA: Diagnosis not present

## 2021-11-15 DIAGNOSIS — N3946 Mixed incontinence: Secondary | ICD-10-CM | POA: Diagnosis not present

## 2021-12-17 DIAGNOSIS — I77811 Abdominal aortic ectasia: Secondary | ICD-10-CM | POA: Diagnosis not present

## 2021-12-17 DIAGNOSIS — I700 Atherosclerosis of aorta: Secondary | ICD-10-CM | POA: Diagnosis not present

## 2022-01-09 DIAGNOSIS — H47393 Other disorders of optic disc, bilateral: Secondary | ICD-10-CM | POA: Diagnosis not present

## 2022-07-14 DIAGNOSIS — G4733 Obstructive sleep apnea (adult) (pediatric): Secondary | ICD-10-CM | POA: Diagnosis not present

## 2022-07-14 DIAGNOSIS — E7801 Familial hypercholesterolemia: Secondary | ICD-10-CM | POA: Diagnosis not present

## 2022-07-14 DIAGNOSIS — R0602 Shortness of breath: Secondary | ICD-10-CM | POA: Diagnosis not present

## 2022-07-14 DIAGNOSIS — I480 Paroxysmal atrial fibrillation: Secondary | ICD-10-CM | POA: Diagnosis not present

## 2022-07-14 DIAGNOSIS — Z23 Encounter for immunization: Secondary | ICD-10-CM | POA: Diagnosis not present

## 2022-07-22 DIAGNOSIS — R0602 Shortness of breath: Secondary | ICD-10-CM | POA: Diagnosis not present

## 2022-07-24 DIAGNOSIS — R079 Chest pain, unspecified: Secondary | ICD-10-CM | POA: Diagnosis not present

## 2022-07-24 DIAGNOSIS — I480 Paroxysmal atrial fibrillation: Secondary | ICD-10-CM | POA: Diagnosis not present

## 2022-07-24 DIAGNOSIS — G4733 Obstructive sleep apnea (adult) (pediatric): Secondary | ICD-10-CM | POA: Diagnosis not present

## 2022-07-24 DIAGNOSIS — R0602 Shortness of breath: Secondary | ICD-10-CM | POA: Diagnosis not present

## 2022-07-24 DIAGNOSIS — E7801 Familial hypercholesterolemia: Secondary | ICD-10-CM | POA: Diagnosis not present

## 2022-07-30 ENCOUNTER — Encounter
Admission: RE | Disposition: A | Discharge status: Home / Self Care | Payer: Self-pay | Source: Home / Self Care | Attending: Cardiology

## 2022-07-30 ENCOUNTER — Encounter: Payer: Self-pay | Admitting: Cardiology

## 2022-07-30 ENCOUNTER — Other Ambulatory Visit: Payer: Self-pay

## 2022-07-30 ENCOUNTER — Observation Stay
Admission: RE | Admit: 2022-07-30 | Discharge: 2022-07-31 | Disposition: A | Discharge status: Home / Self Care | Payer: PPO | Attending: Cardiology | Admitting: Cardiology

## 2022-07-30 DIAGNOSIS — R9430 Abnormal result of cardiovascular function study, unspecified: Principal | ICD-10-CM | POA: Insufficient documentation

## 2022-07-30 DIAGNOSIS — Z91040 Latex allergy status: Secondary | ICD-10-CM | POA: Diagnosis not present

## 2022-07-30 DIAGNOSIS — I2510 Atherosclerotic heart disease of native coronary artery without angina pectoris: Secondary | ICD-10-CM | POA: Diagnosis present

## 2022-07-30 DIAGNOSIS — I25118 Atherosclerotic heart disease of native coronary artery with other forms of angina pectoris: Secondary | ICD-10-CM | POA: Diagnosis not present

## 2022-07-30 DIAGNOSIS — Z23 Encounter for immunization: Secondary | ICD-10-CM | POA: Insufficient documentation

## 2022-07-30 DIAGNOSIS — R079 Chest pain, unspecified: Secondary | ICD-10-CM | POA: Insufficient documentation

## 2022-07-30 DIAGNOSIS — Z9861 Coronary angioplasty status: Secondary | ICD-10-CM | POA: Diagnosis not present

## 2022-07-30 HISTORY — PX: CORONARY STENT INTERVENTION: CATH118234

## 2022-07-30 HISTORY — PX: LEFT HEART CATH AND CORONARY ANGIOGRAPHY: CATH118249

## 2022-07-30 LAB — POCT ACTIVATED CLOTTING TIME
Activated Clotting Time: 251 seconds
Activated Clotting Time: 323 seconds

## 2022-07-30 SURGERY — LEFT HEART CATH AND CORONARY ANGIOGRAPHY
Anesthesia: Moderate Sedation

## 2022-07-30 MED ORDER — ATORVASTATIN CALCIUM 80 MG PO TABS
80.0000 mg | ORAL_TABLET | Freq: Every evening | ORAL | Status: DC
  Administered 2022-07-30: 80 mg via ORAL
  Filled 2022-07-30: qty 1

## 2022-07-30 MED ORDER — ACETAMINOPHEN 325 MG PO TABS: 650.0000 mg | ORAL_TABLET | ORAL | Status: DC | PRN

## 2022-07-30 MED ORDER — SODIUM CHLORIDE 0.9% FLUSH: 3.0000 mL | INTRAVENOUS | Status: DC | PRN

## 2022-07-30 MED ORDER — FENTANYL CITRATE (PF) 100 MCG/2ML IJ SOLN
INTRAMUSCULAR | Status: AC
  Filled 2022-07-30: qty 2

## 2022-07-30 MED ORDER — SODIUM CHLORIDE 0.9 % IV SOLN: 250.0000 mL | INTRAVENOUS | Status: DC | PRN

## 2022-07-30 MED ORDER — VERAPAMIL HCL 2.5 MG/ML IV SOLN
INTRAVENOUS | Status: DC | PRN
  Administered 2022-07-30: 2.5 mg via INTRA_ARTERIAL

## 2022-07-30 MED ORDER — LABETALOL HCL 5 MG/ML IV SOLN: 10.0000 mg | INTRAVENOUS | Status: AC | PRN

## 2022-07-30 MED ORDER — TAMSULOSIN HCL 0.4 MG PO CAPS
0.4000 mg | ORAL_CAPSULE | Freq: Every evening | ORAL | Status: DC
  Administered 2022-07-30: 0.4 mg via ORAL
  Filled 2022-07-30: qty 1

## 2022-07-30 MED ORDER — TICAGRELOR 90 MG PO TABS
ORAL_TABLET | ORAL | Status: DC | PRN
  Administered 2022-07-30: 180 mg via ORAL

## 2022-07-30 MED ORDER — VERAPAMIL HCL 2.5 MG/ML IV SOLN
INTRAVENOUS | Status: AC
  Filled 2022-07-30: qty 2

## 2022-07-30 MED ORDER — LIDOCAINE HCL (PF) 1 % IJ SOLN
INTRAMUSCULAR | Status: DC | PRN
  Administered 2022-07-30: 2 mL

## 2022-07-30 MED ORDER — HEPARIN (PORCINE) IN NACL 1000-0.9 UT/500ML-% IV SOLN
INTRAVENOUS | Status: DC | PRN
  Administered 2022-07-30: 500 mL
  Administered 2022-07-30: 1000 mL

## 2022-07-30 MED ORDER — NITROGLYCERIN 1 MG/10 ML FOR IR/CATH LAB
INTRA_ARTERIAL | Status: DC | PRN
  Administered 2022-07-30 (×2): 200 ug via INTRACORONARY

## 2022-07-30 MED ORDER — LIDOCAINE HCL 1 % IJ SOLN
INTRAMUSCULAR | Status: AC
  Filled 2022-07-30: qty 20

## 2022-07-30 MED ORDER — HYDRALAZINE HCL 20 MG/ML IJ SOLN: 10.0000 mg | INTRAMUSCULAR | Status: AC | PRN

## 2022-07-30 MED ORDER — TICAGRELOR 90 MG PO TABS
90.0000 mg | ORAL_TABLET | Freq: Two times a day (BID) | ORAL | Status: DC
  Administered 2022-07-30 – 2022-07-31 (×2): 90 mg via ORAL
  Filled 2022-07-30 (×3): qty 1

## 2022-07-30 MED ORDER — HEPARIN SODIUM (PORCINE) 1000 UNIT/ML IJ SOLN
INTRAMUSCULAR | Status: AC
  Filled 2022-07-30: qty 10

## 2022-07-30 MED ORDER — DILTIAZEM HCL ER COATED BEADS 180 MG PO CP24
180.0000 mg | ORAL_CAPSULE | Freq: Every day | ORAL | Status: DC
  Filled 2022-07-30 (×2): qty 1

## 2022-07-30 MED ORDER — ONDANSETRON HCL 4 MG/2ML IJ SOLN: 4.0000 mg | Freq: Four times a day (QID) | INTRAMUSCULAR | Status: DC | PRN

## 2022-07-30 MED ORDER — SODIUM CHLORIDE 0.9% FLUSH: 3.0000 mL | Freq: Two times a day (BID) | INTRAVENOUS | Status: DC

## 2022-07-30 MED ORDER — ASPIRIN 81 MG PO CHEW: 81.0000 mg | CHEWABLE_TABLET | ORAL | Status: DC

## 2022-07-30 MED ORDER — MIDAZOLAM HCL 2 MG/2ML IJ SOLN
INTRAMUSCULAR | Status: AC
  Filled 2022-07-30: qty 2

## 2022-07-30 MED ORDER — SODIUM CHLORIDE 0.9 % WEIGHT BASED INFUSION: 1.0000 mL/kg/h | INTRAVENOUS | Status: AC

## 2022-07-30 MED ORDER — TICAGRELOR 90 MG PO TABS
ORAL_TABLET | ORAL | Status: AC
  Filled 2022-07-30: qty 2

## 2022-07-30 MED ORDER — HEPARIN (PORCINE) IN NACL 1000-0.9 UT/500ML-% IV SOLN
INTRAVENOUS | Status: AC
  Filled 2022-07-30: qty 1000

## 2022-07-30 MED ORDER — ASPIRIN 81 MG PO CHEW
81.0000 mg | CHEWABLE_TABLET | Freq: Every day | ORAL | Status: DC
  Administered 2022-07-31: 81 mg via ORAL
  Filled 2022-07-30: qty 1

## 2022-07-30 MED ORDER — SODIUM CHLORIDE 0.9% FLUSH
3.0000 mL | Freq: Two times a day (BID) | INTRAVENOUS | Status: DC
  Administered 2022-07-30 – 2022-07-31 (×2): 3 mL via INTRAVENOUS

## 2022-07-30 MED ORDER — ASPIRIN 81 MG PO CHEW
CHEWABLE_TABLET | ORAL | Status: DC | PRN
  Administered 2022-07-30: 243 mg via ORAL

## 2022-07-30 MED ORDER — SODIUM CHLORIDE 0.9 % WEIGHT BASED INFUSION
1.0000 mL/kg/h | INTRAVENOUS | Status: DC
  Administered 2022-07-30: 1 mL/kg/h via INTRAVENOUS

## 2022-07-30 MED ORDER — METOPROLOL TARTRATE 25 MG PO TABS
25.0000 mg | ORAL_TABLET | Freq: Two times a day (BID) | ORAL | Status: DC
  Administered 2022-07-31: 25 mg via ORAL
  Filled 2022-07-30 (×2): qty 1

## 2022-07-30 MED ORDER — ASPIRIN 81 MG PO CHEW
CHEWABLE_TABLET | ORAL | Status: AC
  Filled 2022-07-30: qty 3

## 2022-07-30 MED ORDER — IOHEXOL 300 MG/ML  SOLN
INTRAMUSCULAR | Status: DC | PRN
  Administered 2022-07-30: 380 mL

## 2022-07-30 MED ORDER — HEPARIN SODIUM (PORCINE) 1000 UNIT/ML IJ SOLN
INTRAMUSCULAR | Status: DC | PRN
  Administered 2022-07-30: 5000 [IU] via INTRAVENOUS
  Administered 2022-07-30: 7000 [IU] via INTRAVENOUS
  Administered 2022-07-30: 4000 [IU] via INTRAVENOUS

## 2022-07-30 MED ORDER — MIDAZOLAM HCL 2 MG/2ML IJ SOLN
INTRAMUSCULAR | Status: DC | PRN
  Administered 2022-07-30: 1 mg via INTRAVENOUS

## 2022-07-30 MED ORDER — FENTANYL CITRATE (PF) 100 MCG/2ML IJ SOLN
INTRAMUSCULAR | Status: DC | PRN
  Administered 2022-07-30 (×2): 25 ug via INTRAVENOUS

## 2022-07-30 MED ORDER — INFLUENZA VAC A&B SA ADJ QUAD 0.5 ML IM PRSY
0.5000 mL | PREFILLED_SYRINGE | INTRAMUSCULAR | Status: AC
  Administered 2022-07-31: 0.5 mL via INTRAMUSCULAR
  Filled 2022-07-30: qty 0.5

## 2022-07-30 MED ORDER — SODIUM CHLORIDE 0.9 % WEIGHT BASED INFUSION
3.0000 mL/kg/h | INTRAVENOUS | Status: AC
  Administered 2022-07-30: 3 mL/kg/h via INTRAVENOUS

## 2022-07-30 MED ORDER — ACETAMINOPHEN 325 MG PO TABS
ORAL_TABLET | ORAL | Status: AC
  Administered 2022-07-30: 650 mg via ORAL
  Filled 2022-07-30: qty 2

## 2022-07-30 SURGICAL SUPPLY — 26 items
BALLN TREK RX 2.25X15 (BALLOONS) ×1
BALLN ~~LOC~~ TREK NEO RX 2.5X15 (BALLOONS) IMPLANT
BALLN ~~LOC~~ TREK NEO RX 3.25X8 (BALLOONS) IMPLANT
BALLOON TREK RX 2.25X15 (BALLOONS) IMPLANT
CATH 5FR JL3.5 JR4 ANG PIG MP (CATHETERS) IMPLANT
CATH INFINITI 5FR JL4 (CATHETERS) IMPLANT
CATH INFINITI 5FR JL5 (CATHETERS) IMPLANT
CATH LAUNCHER 6FR EBU 4 (CATHETERS) IMPLANT
CATH SHOCKWAVE 3.0X12 (CATHETERS) IMPLANT
CATH VISTA GUIDE 6FR XB4 (CATHETERS) IMPLANT
CATHETER SHOCKWAVE 3.0X12 (CATHETERS) ×1
DEVICE RAD TR BAND REGULAR (VASCULAR PRODUCTS) IMPLANT
DRAPE BRACHIAL (DRAPES) IMPLANT
GLIDESHEATH SLEND SS 6F .021 (SHEATH) IMPLANT
GUIDEWIRE INQWIRE 1.5J.035X260 (WIRE) IMPLANT
INQWIRE 1.5J .035X260CM (WIRE) ×1
KIT ENCORE 26 ADVANTAGE (KITS) IMPLANT
KIT SYRINGE INJ CVI SPIKEX1 (MISCELLANEOUS) IMPLANT
PACK CARDIAC CATH (CUSTOM PROCEDURE TRAY) ×1 IMPLANT
PROTECTION STATION PRESSURIZED (MISCELLANEOUS) ×1
SET ATX SIMPLICITY (MISCELLANEOUS) IMPLANT
STATION PROTECTION PRESSURIZED (MISCELLANEOUS) IMPLANT
STENT ONYX FRONTIER 3.0X15 (Permanent Stent) IMPLANT
TUBING CIL FLEX 10 FLL-RA (TUBING) IMPLANT
WIRE ASAHI PROWATER 180CM (WIRE) IMPLANT
WIRE G HI TQ BMW 190 (WIRE) IMPLANT

## 2022-07-30 NOTE — Discharge Summary (Signed)
Discharge Summary      Patient ID: Joshua Barajas MRN: [id number] DOB/AGE: 11/16/1950 71 y.o.  Admit date: 07/30/2022 Discharge date: 07/31/2022  Primary Discharge Diagnosis abnormal cardiac functional studies Secondary Discharge Diagnosis coronary angioplasty status  Significant Diagnostic Studies: yes  Left heart cath and coronary angiography with shockwave and coronary stent intervention -Dr. Isaias Barajas 07/30/2022    Dist RCA lesion is 30% stenosed.   Prox RCA lesion is 30% stenosed.   Ost LM to Mid LM lesion is 30% stenosed.   Prox LAD to Mid LAD lesion is 95% stenosed.   Mid LAD lesion is 40% stenosed.   A drug-eluting stent was successfully placed using a STENT ONYX FRONTIER 3.0X15.   Post intervention, there is a 0% residual stenosis.   There is mild left ventricular systolic dysfunction.   The left ventricular ejection fraction is 45-50% by visual estimate.   1.  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  Mildly reduced left ventricular function with estimated LVEF 45 to 50% with anteroapical hypokinesis 3.  Shockwave proximal/mid LAD 4.  Successful DES proximal/mid LAD   Recommendations   1.  Dual antiplatelet therapy uninterrupted for 1 year 2.  Continue Cardizem CD and metoprolol tartrate 3.  Consider adding high intensity atorvastatin 4.  Resume Eliquis 07/31/2022, continue triple therapy for 2 weeks then DC aspirin, continue Brilinta and Eliquis  Consults: None  Hospital Course: The patient was brought to the cardiac cath lab and underwent  left heart catheterization and coronary angiography with Dr. Isaias Barajas on 07/30/2022. The patient tolerated with procedure well without complications.  The patient underwent shockwave lithotripsy to the proximal/mid LAD and DES to proximal/mid LAD. On 07/31/2022 the right wrist arteriotomy site was examined and found to be without significant erythema, tenderness to  palpation, or apparent aneurysm.  Hospital course was overall uneventful, on day of discharge the patient was ambulatory and eager to go home.  Discussed cardiac rehab  and new prescription medications in detail. The patient was given aftercare instructions, and ER return precautions and will follow up in office in 1 week, or sooner if needed.     Discharge Exam: Blood pressure (!) 124/113, pulse (!) 52, temperature 97.7 F (36.5 C), resp. rate 16, height 6' 3"  (1.905 m), weight 101.2 kg, SpO2 98 %.   PHYSICAL EXAM General: [name], well nourished, in no acute distress. Sitting upright in hospital bed with daughter at bedside HEENT:  Normocephalic and atraumatic. Neck:   No JVD.  Lungs: Normal respiratory effort on room air.  Clear to ascultation bilaterally. Heart: HRRR . Normal S1 and S2 without gallops or murmurs. Right Radial pulse 2+ Abdomen: non-distended appearing.  Msk: Normal strength and tone for age. Extremities: No clubbing, cyanosis, edema. R wrist arteriotomy site without apparent swelling, bleeding, or ecchymosis. Gauze and tegaderm in place Neuro: Alert and oriented x3 Psych:  mood appropriate for situation.    Labs:   Lab Results  Component Value Date   WBC 4.9 07/31/2022   HGB 11.6 (L) 07/31/2022   HCT 34.2 (L) 07/31/2022   MCV 95.0 07/31/2022   PLT 129 (L) 07/31/2022    Recent Labs  Lab 07/31/22 0424  NA 140  K 4.4  CL 111  CO2 24  BUN 13  CREATININE 0.88  CALCIUM 8.7*  GLUCOSE 120*    EKG: 07/30/2022 sinus bradycardia rate 48  FOLLOW UP PLANS AND APPOINTMENTS Discharge Instructions     AMB  Referral to Cardiac Rehabilitation - Phase II   Complete by: As directed    Diagnosis: Coronary Stents   After initial evaluation and assessments completed: Virtual Based Care may be provided alone or in conjunction with Phase 2 Cardiac Rehab based on patient barriers.: Yes   Intensive Cardiac Rehabilitation (ICR) [facility] location only OR Traditional  Cardiac Rehabilitation (TCR) *If criteria for ICR are not met will enroll in TCR [facility] only): Yes      Allergies as of 07/31/2022       Reactions   Latex Rash   Bandaids(only) if left on for a few days.        Medication List     STOP taking these medications    diltiazem 180 MG 24 hr capsule Commonly known as: CARDIZEM CD       TAKE these medications    apixaban 5 MG Tabs tablet Commonly known as: ELIQUIS Take 1 tablet (5 mg total) by mouth 2 (two) times daily.   aspirin EC 81 MG tablet Take 81 mg by mouth daily. Swallow whole.   atorvastatin 80 MG tablet Commonly known as: LIPITOR Take 1 tablet (80 mg total) by mouth every evening.   cholecalciferol 25 MCG (1000 UNIT) tablet Commonly known as: VITAMIN D3 Take 1,000 Units by mouth daily.   cyanocobalamin 250 MCG tablet Commonly known as: VITAMIN B12 Take 250 mcg by mouth 2 (two) times a week.   metoprolol tartrate 25 MG tablet Commonly known as: LOPRESSOR Take 1 tablet (25 mg total) by mouth 2 (two) times daily.   multivitamin tablet Take 1 tablet by mouth daily.   ticagrelor 90 MG Tabs tablet Commonly known as: BRILINTA Take 1 tablet (90 mg total) by mouth 2 (two) times daily.        Follow-up Information     Barajas, Alexander, MD. Go in 1 week(s).   Specialty: Cardiology Contact information: [facility] [place] [address] [phone]                 BRING ALL MEDICATIONS WITH YOU TO FOLLOW UP APPOINTMENTS  This patient's plan of care was discussed and created with Dr. Saralyn Barajas and he is in agreement.    Time spent with patient: >30 mins Signed:  Tristan Schroeder, PA-C 07/31/2022, 8:53 AM

## 2022-07-31 ENCOUNTER — Encounter: Payer: Self-pay | Admitting: Cardiology

## 2022-07-31 DIAGNOSIS — R079 Chest pain, unspecified: Secondary | ICD-10-CM | POA: Diagnosis not present

## 2022-07-31 DIAGNOSIS — Z01810 Encounter for preprocedural cardiovascular examination: Secondary | ICD-10-CM | POA: Diagnosis not present

## 2022-07-31 DIAGNOSIS — R9430 Abnormal result of cardiovascular function study, unspecified: Secondary | ICD-10-CM | POA: Diagnosis not present

## 2022-07-31 DIAGNOSIS — R931 Abnormal findings on diagnostic imaging of heart and coronary circulation: Secondary | ICD-10-CM | POA: Diagnosis not present

## 2022-07-31 LAB — BASIC METABOLIC PANEL
Anion gap: 5 (ref 5–15)
BUN: 13 mg/dL (ref 8–23)
CO2: 24 mmol/L (ref 22–32)
Calcium: 8.7 mg/dL — ABNORMAL LOW (ref 8.9–10.3)
Chloride: 111 mmol/L (ref 98–111)
Creatinine, Ser: 0.88 mg/dL (ref 0.61–1.24)
GFR, Estimated: >60 mL/min (ref >60)
Glucose, Bld: 120 mg/dL — ABNORMAL HIGH (ref 70–99)
Potassium: 4.4 mmol/L (ref 3.5–5.1)
Sodium: 140 mmol/L (ref 135–145)

## 2022-07-31 LAB — CBC
HCT: 34.2 % — ABNORMAL LOW (ref 39.0–52.0)
Hemoglobin: 11.6 g/dL — ABNORMAL LOW (ref 13.0–17.0)
MCH: 32.2 pg (ref 26.0–34.0)
MCHC: 33.9 g/dL (ref 30.0–36.0)
MCV: 95 fL (ref 80.0–100.0)
Platelets: 129 10*3/uL — ABNORMAL LOW (ref 150–400)
RBC: 3.6 MIL/uL — ABNORMAL LOW (ref 4.22–5.81)
RDW: 12.9 % (ref 11.5–15.5)
WBC: 4.9 10*3/uL (ref 4.0–10.5)
nRBC: 0 % (ref 0.0–0.2)

## 2022-07-31 MED ORDER — ATORVASTATIN CALCIUM 80 MG PO TABS: 80.0000 mg | ORAL_TABLET | Freq: Every evening | ORAL | 3 refills | Status: DC

## 2022-07-31 MED ORDER — TICAGRELOR 90 MG PO TABS: 90.0000 mg | ORAL_TABLET | Freq: Two times a day (BID) | ORAL | 3 refills | Status: DC

## 2022-08-01 LAB — LIPOPROTEIN A (LPA): Lipoprotein (a): 85.8 nmol/L — ABNORMAL HIGH (ref <75.0)

## 2022-08-04 ENCOUNTER — Ambulatory Visit (INDEPENDENT_AMBULATORY_CARE_PROVIDER_SITE_OTHER): Payer: PPO | Admitting: Internal Medicine

## 2022-08-04 VITALS — BP 139/72 | HR 51 | Resp 12 | Ht 75.0 in | Wt 225.5 lb

## 2022-08-04 DIAGNOSIS — I4891 Unspecified atrial fibrillation: Secondary | ICD-10-CM

## 2022-08-04 DIAGNOSIS — Z7189 Other specified counseling: Secondary | ICD-10-CM

## 2022-08-04 DIAGNOSIS — G4733 Obstructive sleep apnea (adult) (pediatric): Secondary | ICD-10-CM | POA: Diagnosis not present

## 2022-08-04 NOTE — Progress Notes (Signed)
[facility] [place], [place] [address]  Pulmonary Sleep Medicine   Office Visit Note  Patient Name: Joshua Barajas DOB: 1951-05-05 MRN [id number]    Chief Complaint: Obstructive Sleep Apnea visit  Brief History:  Joshua Barajas is seen today for an annual follow up on CPAP at 9 cmh20. The patient has a 3 year history of sleep apnea. Patient is using PAP nightly.  The patient feels rested after sleeping with PAP.  The patient reports benefit from PAP use. Reported sleepiness is  improved and the Epworth Sleepiness Score is 3 out of 24. The patient does take daily naps for an hour without CPAP. The patient complains of the following: no major complaints  The compliance download shows 98% compliance with an average use time of 7 hours 30 minutes. The AHI is 1.4.  The patient does not complain of limb movements disrupting sleep.  ROS  General: (-) fever, (-) chills, (-) night sweat Nose and Sinuses: (-) nasal stuffiness or itchiness, (-) postnasal drip, (-) nosebleeds, (-) sinus trouble. Mouth and Throat: (-) sore throat, (-) hoarseness. Neck: (-) swollen glands, (-) enlarged thyroid, (-) neck pain. Respiratory: - cough, - shortness of breath, - wheezing. Neurologic: - numbness, - tingling. Psychiatric: - anxiety, - depression   Current Medication: Outpatient Encounter Medications as of 08/04/2022  Medication Sig   apixaban (ELIQUIS) 5 MG TABS tablet Take 1 tablet (5 mg total) by mouth 2 (two) times daily.   aspirin EC 81 MG tablet Take 81 mg by mouth daily. Swallow whole.   atorvastatin (LIPITOR) 80 MG tablet Take 1 tablet (80 mg total) by mouth every evening.   cholecalciferol (VITAMIN D3) 25 MCG (1000 UNIT) tablet Take 1,000 Units by mouth daily.   cyanocobalamin (VITAMIN B12) 250 MCG tablet Take 250 mcg by mouth 2 (two) times a week.   metoprolol tartrate (LOPRESSOR) 25 MG tablet Take 1 tablet (25 mg total) by mouth 2 (two) times daily.   Multiple Vitamin  (MULTIVITAMIN) tablet Take 1 tablet by mouth daily.   ticagrelor (BRILINTA) 90 MG TABS tablet Take 1 tablet (90 mg total) by mouth 2 (two) times daily.   No facility-administered encounter medications on file as of 08/04/2022.    Surgical History: Past Surgical History:  Procedure Laterality Date   CATARACT EXTRACTION Bilateral    CLOSED REDUCTION NASAL FRACTURE N/A 03/12/2021   Procedure: CLOSED REDUCTION NASAL FRACTURE;  Surgeon: Clyde Canterbury, MD;  Location: [facility];  Service: ENT;  Laterality: N/A;  sleep apnea   COLONOSCOPY     COLONOSCOPY WITH PROPOFOL N/A 09/28/2020   Procedure: COLONOSCOPY WITH PROPOFOL;  Surgeon: Lesly Rubenstein, MD;  Location: [facility];  Service: Endoscopy;  Laterality: N/A;   CORONARY STENT INTERVENTION N/A 07/30/2022   Procedure: CORONARY STENT INTERVENTION;  Surgeon: Isaias Cowman, MD;  Location: [facility];  Service: Cardiovascular;  Laterality: N/A;   EYE SURGERY     LEFT HEART CATH AND CORONARY ANGIOGRAPHY N/A 07/30/2022   Procedure: LEFT HEART CATH AND CORONARY ANGIOGRAPHY;  Surgeon: Isaias Cowman, MD;  Location: [facility];  Service: Cardiovascular;  Laterality: N/A;   SEPTOPLASTY     TONSILECTOMY, ADENOIDECTOMY, BILATERAL MYRINGOTOMY AND TUBES     TONSILLECTOMY      Medical History: Past Medical History:  Diagnosis Date   Dysrhythmia    ED (erectile dysfunction)    Hematuria    HLD (hyperlipidemia)    Over weight    Paroxysmal atrial fibrillation (  [place])    Prostate cancer ([place])    Rising PSA level    Sleep apnea    CPAP   Squamous cell carcinoma     Family History: Non contributory to the present illness  Social History: Social History   Socioeconomic History   Marital status: Divorced    Spouse name: Not on file   Number of children: Not on file   Years of education: Not on file   Highest education level: Not on file  Occupational History   Occupation: retired  Tobacco Use    Smoking status: Former    Types: Cigarettes    Quit date: 04/10/1976    Years since quitting: 46.3   Smokeless tobacco: Never  Vaping Use   Vaping Use: Never used  Substance and Sexual Activity   Alcohol use: No   Drug use: No   Sexual activity: Not on file  Other Topics Concern   Not on file  Social History Narrative   Not on file   Social Determinants of Health   Financial Resource Strain: Not on file  Food Insecurity: No Food Insecurity (07/30/2022)   Hunger Vital Sign    Worried About Running Out of Food in the Last Year: Never true    Ran Out of Food in the Last Year: Never true  Transportation Needs: No Transportation Needs (07/30/2022)   PRAPARE - Hydrologist (Medical): No    Lack of Transportation (Non-Medical): No  Physical Activity: Not on file  Stress: Not on file  Social Connections: Not on file  Intimate Partner Violence: Not At Risk (07/30/2022)   Humiliation, Afraid, Rape, and Kick questionnaire    Fear of Current or Ex-Partner: No    Emotionally Abused: No    Physically Abused: No    Sexually Abused: No    Vital Signs: Blood pressure 139/72, pulse (!) 51, resp. rate 12, height '6\' 3"'$  (1.905 m), weight 225 lb 8 oz (102.3 kg), SpO2 98 %. Body mass index is 28.19 kg/m.    Examination: General Appearance: The patient is well-developed, well-nourished, and in no distress. Neck Circumference: 38cm Skin: Gross inspection of skin unremarkable. Head: normocephalic, no gross deformities. Eyes: no gross deformities noted. ENT: ears appear grossly normal Neurologic: Alert and oriented. No involuntary movements.  STOP BANG RISK ASSESSMENT S (snore) Have you been told that you snore?     NO   T (tired) Are you often tired, fatigued, or sleepy during the day?   YES  O (obstruction) Do you stop breathing, choke, or gasp during sleep? NO   P (pressure) Do you have or are you being treated for high blood pressure? NO   B (BMI)  Is your body index greater than 35 kg/m? NO   A (age) Are you 22 years old or older? YES   N (neck) Do you have a neck circumference greater than 16 inches?   NO   G (gender) Are you a male? YES   TOTAL STOP/BANG "YES" ANSWERS 3       A STOP-Bang score of 2 or less is considered low risk, and a score of 5 or more is high risk for having either moderate or severe OSA. For people who score 3 or 4, doctors may need to perform further assessment to determine how likely they are to have OSA.         EPWORTH SLEEPINESS SCALE:  Scale:  (0)= no chance of dozing; (1)= slight chance  of dozing; (2)= moderate chance of dozing; (3)= high chance of dozing  Chance  Situtation    Sitting and reading: 0    Watching TV: 0    Sitting Inactive in public: 0    As a passenger in car: 0      Lying down to rest: 3    Sitting and talking: 0    Sitting quielty after lunch: 0    In a car, stopped in traffic: 0   TOTAL SCORE:   3 out of 24    SLEEP STUDIES:  PSG (11/15/18)  AHI 5.1, REM AHI 17, SPO2  89% TITRATION (11/29/18)  CPAP at 9 cmh20   CPAP COMPLIANCE DATA:  Date Range: 07/30/21 - 07/29/22  Average Daily Use: 7 hours 30 minutes  Median Use: 7 hours 43 minutes  Compliance for > 4 Hours: 356 days  AHI: 1.4 respiratory events per hour  Days Used: 357/365  Mask Leak: 24.5  95th Percentile Pressure: 9 cmh20         LABS: Recent Results (from the past 2160 hour(s))  POCT Activated clotting time     Status: None   Collection Time: 07/30/22  8:29 AM  Result Value Ref Range   Activated Clotting Time 251 seconds    Comment: Reference range 74-137 seconds for patients not on anticoagulant therapy.  POCT Activated clotting time     Status: None   Collection Time: 07/30/22  8:56 AM  Result Value Ref Range   Activated Clotting Time 323 seconds    Comment: Reference range 74-137 seconds for patients not on anticoagulant therapy.  Basic metabolic panel     Status:  Abnormal   Collection Time: 07/31/22  4:24 AM  Result Value Ref Range   Sodium 140 135 - 145 mmol/L   Potassium 4.4 3.5 - 5.1 mmol/L   Chloride 111 98 - 111 mmol/L   CO2 24 22 - 32 mmol/L   Glucose, Bld 120 (H) 70 - 99 mg/dL    Comment: Glucose reference range applies only to samples taken after fasting for at least 8 hours.   BUN 13 8 - 23 mg/dL   Creatinine, Ser 0.88 0.61 - 1.24 mg/dL   Calcium 8.7 (L) 8.9 - 10.3 mg/dL   GFR, Estimated >60 >60 mL/min    Comment: (NOTE) Calculated using the CKD-EPI Creatinine Equation (2021)    Anion gap 5 5 - 15    Comment: Performed at [facility] [address] [place], [place] [address]  CBC     Status: Abnormal   Collection Time: 07/31/22  4:24 AM  Result Value Ref Range   WBC 4.9 4.0 - 10.5 K/uL   RBC 3.60 (L) 4.22 - 5.81 MIL/uL   Hemoglobin 11.6 (L) 13.0 - 17.0 g/dL   HCT 34.2 (L) 39.0 - 52.0 %   MCV 95.0 80.0 - 100.0 fL   MCH 32.2 26.0 - 34.0 pg   MCHC 33.9 30.0 - 36.0 g/dL   RDW 12.9 11.5 - 15.5 %   Platelets 129 (L) 150 - 400 K/uL    Comment: REPEATED TO VERIFY   nRBC 0.0 0.0 - 0.2 %    Comment: Performed at [facility], [address] [place], [place] [address]  Lipoprotein A (LPA)     Status: Abnormal   Collection Time: 07/31/22  4:24 AM  Result Value Ref Range   Lipoprotein (a) 85.8 (H) <75.0 nmol/L    Comment: (NOTE) Note:  Values greater than  or equal to 75.0 nmol/L may       indicate an independent risk factor for CHD,       but must be evaluated with caution when applied       to non-Caucasian populations due to the       influence of genetic factors on Lp(a) across       ethnicities. Performed At: [facility] [place], [place] [id number] Rush Farmer MD UX:[id number]     Radiology: CARDIAC CATHETERIZATION  Result Date: 07/30/2022   Dist RCA lesion is 30% stenosed.   Prox RCA lesion is 30% stenosed.   Ost LM to Mid LM lesion is 30% stenosed.   Prox LAD to Mid LAD  lesion is 95% stenosed.   Mid LAD lesion is 40% stenosed.   A drug-eluting stent was successfully placed using a STENT ONYX FRONTIER 3.0X15.   Post intervention, there is a 0% residual stenosis.   There is mild left ventricular systolic dysfunction.   The left ventricular ejection fraction is 45-50% by visual estimate. 1.  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  Mildly reduced left ventricular function with estimated LVEF 45 to 50% with anteroapical hypokinesis 3.  Shockwave proximal/mid LAD 4.  Successful DES proximal/mid LAD Recommendations 1.  Dual antiplatelet therapy uninterrupted for 1 year 2.  Continue Cardizem CD and metoprolol tartrate 3.  Consider adding high intensity atorvastatin 4.  Resume Eliquis 07/31/2022, continue triple therapy for 2 weeks then DC aspirin, continue Brilinta and Eliquis    No results found.  CARDIAC CATHETERIZATION  Result Date: 07/30/2022   Dist RCA lesion is 30% stenosed.   Prox RCA lesion is 30% stenosed.   Ost LM to Mid LM lesion is 30% stenosed.   Prox LAD to Mid LAD lesion is 95% stenosed.   Mid LAD lesion is 40% stenosed.   A drug-eluting stent was successfully placed using a STENT ONYX FRONTIER 3.0X15.   Post intervention, there is a 0% residual stenosis.   There is mild left ventricular systolic dysfunction.   The left ventricular ejection fraction is 45-50% by visual estimate. 1.  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  Mildly reduced left ventricular function with estimated LVEF 45 to 50% with anteroapical hypokinesis 3.  Shockwave proximal/mid LAD 4.  Successful DES proximal/mid LAD Recommendations 1.  Dual antiplatelet therapy uninterrupted for 1 year 2.  Continue Cardizem CD and metoprolol tartrate 3.  Consider adding high intensity atorvastatin 4.  Resume Eliquis 07/31/2022, continue triple therapy for 2 weeks then DC aspirin, continue Brilinta and Eliquis      Assessment and  Plan: Patient Active Problem List   Diagnosis Date Noted   CAD (coronary artery disease) 07/30/2022   OSA on CPAP 08/05/2021   CPAP use counseling 08/05/2021   Atrial fibrillation ([place]) 08/05/2021   Obstructive sleep apnea 10/24/2019   A-fib ([place]) 11/21/2016   Prostate cancer ([place]) 09/24/2015   CA of prostate ([place]) 01/08/2015   HLD (hyperlipidemia) 04/13/2014   Cardiac conduction disorder 04/05/2014   ED (erectile dysfunction) of organic origin 04/05/2014    1. OSA on CPAP The patient does tolerate PAP and reports  benefit from PAP use. The patient was reminded how to clean equipment and advised to replace supplies routinely. The patient was also counselled on weight loss. The compliance is excellent. The AHI is 1.4.   OSA on cpap-  CPAP continues to be medically necessary to treat this  patient's OSA.  Continue with excellent compliance. F/u one year.    2. CPAP use counseling CPAP Counseling: had a lengthy discussion with the patient regarding the importance of PAP therapy in management of the sleep apnea. Patient appears to understand the risk factor reduction and also understands the risks associated with untreated sleep apnea. Patient will try to make a good faith effort to remain compliant with therapy. Also instructed the patient on proper cleaning of the device including the water must be changed daily if possible and use of distilled water is preferred. Patient understands that the machine should be regularly cleaned with appropriate recommended cleaning solutions that do not damage the PAP machine for example given white vinegar and water rinses. Other methods such as ozone treatment may not be as good as these simple methods to achieve cleaning.   3. Atrial fibrillation, unspecified type [facility]) Understands the importance of treating OSA in light of same.    General Counseling: I have discussed the findings of the evaluation and examination with Joshua Barajas.  I have also discussed any  further diagnostic evaluation thatmay be needed or ordered today. Joshua Barajas verbalizes understanding of the findings of todays visit. We also reviewed his medications today and discussed drug interactions and side effects including but not limited excessive drowsiness and altered mental states. We also discussed that there is always a risk not just to him but also people around him. he has been encouraged to call the office with any questions or concerns that should arise related to todays visit.  No orders of the defined types were placed in this encounter.       I have personally obtained a history, examined the patient, evaluated laboratory and imaging results, formulated the assessment and plan and placed orders. This patient was seen today by Tressie Ellis, PA-C in collaboration with Dr. Devona Konig.   Allyne Gee, MD [facility] Diplomate ABMS Pulmonary Critical Care Medicine and Sleep Medicine

## 2022-08-04 NOTE — Patient Instructions (Signed)

## 2022-08-07 DIAGNOSIS — R0602 Shortness of breath: Secondary | ICD-10-CM | POA: Diagnosis not present

## 2022-08-07 DIAGNOSIS — I480 Paroxysmal atrial fibrillation: Secondary | ICD-10-CM | POA: Diagnosis not present

## 2022-08-07 DIAGNOSIS — Z955 Presence of coronary angioplasty implant and graft: Secondary | ICD-10-CM | POA: Diagnosis not present

## 2022-08-07 DIAGNOSIS — G4733 Obstructive sleep apnea (adult) (pediatric): Secondary | ICD-10-CM | POA: Diagnosis not present

## 2022-08-07 DIAGNOSIS — R079 Chest pain, unspecified: Secondary | ICD-10-CM | POA: Diagnosis not present

## 2022-08-07 DIAGNOSIS — E7801 Familial hypercholesterolemia: Secondary | ICD-10-CM | POA: Diagnosis not present

## 2022-08-13 ENCOUNTER — Encounter: Payer: PPO | Attending: Psychiatry

## 2022-08-13 ENCOUNTER — Other Ambulatory Visit: Payer: Self-pay

## 2022-08-13 DIAGNOSIS — Z955 Presence of coronary angioplasty implant and graft: Secondary | ICD-10-CM

## 2022-08-13 NOTE — Progress Notes (Signed)
Virtual Visit completed. Patient informed on EP and RD appointment and 6 Minute walk test. Patient also informed of patient health questionnaires on My Chart. Patient Verbalizes understanding. Visit diagnosis can be found in [facility] 07/31/22.

## 2022-08-18 DIAGNOSIS — H903 Sensorineural hearing loss, bilateral: Secondary | ICD-10-CM | POA: Diagnosis not present

## 2022-08-21 ENCOUNTER — Encounter: Payer: PPO | Attending: Cardiology

## 2022-08-21 VITALS — Ht 74.5 in | Wt 221.1 lb

## 2022-08-21 DIAGNOSIS — Z955 Presence of coronary angioplasty implant and graft: Secondary | ICD-10-CM | POA: Diagnosis not present

## 2022-08-21 DIAGNOSIS — Z48812 Encounter for surgical aftercare following surgery on the circulatory system: Secondary | ICD-10-CM | POA: Diagnosis not present

## 2022-08-21 NOTE — Progress Notes (Signed)
Cardiac Individual Treatment Plan  Patient Details  Name: Joshua Barajas MRN: [id number] Date of Birth: 10-Jan-1951 Referring Provider:   Flowsheet Row Cardiac Rehab from 08/21/2022 in [facility] Cardiac and Pulmonary Rehab  Referring Provider Isaias Cowman MD       Initial Encounter Date:  Flowsheet Row Cardiac Rehab from 08/21/2022 in [facility] Cardiac and Pulmonary Rehab  Date 08/21/22       Visit Diagnosis: Status post coronary artery stent placement  Patient's Home Medications on Admission:  Current Outpatient Medications:    apixaban (ELIQUIS) 5 MG TABS tablet, Take 1 tablet (5 mg total) by mouth 2 (two) times daily., Disp: 60 tablet, Rfl: 2   aspirin EC 81 MG tablet, Take 81 mg by mouth daily. Swallow whole. (Patient not taking: Reported on 08/13/2022), Disp: , Rfl:    atorvastatin (LIPITOR) 80 MG tablet, Take 1 tablet (80 mg total) by mouth every evening., Disp: 30 tablet, Rfl: 3   atorvastatin (LIPITOR) 80 MG tablet, Take by mouth. (Patient not taking: Reported on 08/13/2022), Disp: , Rfl:    cholecalciferol (VITAMIN D3) 25 MCG (1000 UNIT) tablet, Take 1,000 Units by mouth daily., Disp: , Rfl:    clopidogrel (PLAVIX) 75 MG tablet, Take 1 tablet (75 mg) by mouth daily., Disp: , Rfl:    cyanocobalamin (VITAMIN B12) 250 MCG tablet, Take 250 mcg by mouth 2 (two) times a week., Disp: , Rfl:    metoprolol tartrate (LOPRESSOR) 25 MG tablet, Take 1 tablet (25 mg total) by mouth 2 (two) times daily., Disp: 60 tablet, Rfl: 2   Multiple Vitamin (MULTIVITAMIN) tablet, Take 1 tablet by mouth daily., Disp: , Rfl:    ticagrelor (BRILINTA) 90 MG TABS tablet, Take 1 tablet (90 mg total) by mouth 2 (two) times daily. (Patient not taking: Reported on 08/13/2022), Disp: 60 tablet, Rfl: 3  Past Medical History: Past Medical History:  Diagnosis Date   Dysrhythmia    ED (erectile dysfunction)    Hematuria    HLD (hyperlipidemia)    Over weight    Paroxysmal atrial fibrillation (HCC)     Prostate cancer (HCC)    Rising PSA level    Sleep apnea    CPAP   Squamous cell carcinoma     Tobacco Use: Social History   Tobacco Use  Smoking Status Former   Packs/day: 1.00   Years: 11.00   Total pack years: 11.00   Types: Cigarettes   Quit date: 04/10/1976   Years since quitting: 46.3  Smokeless Tobacco Never    Labs: Review Flowsheet       Latest Ref Rng & Units 11/21/2016  Labs for ITP Cardiac and Pulmonary Rehab  Cholestrol 0 - 200 mg/dL 143   LDL (calc) 0 - 99 mg/dL 96   HDL-C >40 mg/dL 34   Trlycerides <150 mg/dL 66      Exercise Target Goals: Exercise Program Goal: Individual exercise prescription set using results from initial 6 min walk test and THRR while considering  patient's activity barriers and safety.   Exercise Prescription Goal: Initial exercise prescription builds to 30-45 minutes a day of aerobic activity, 2-3 days per week.  Home exercise guidelines will be given to patient during program as part of exercise prescription that the participant will acknowledge.   Education: Aerobic Exercise: - Group verbal and visual presentation on the components of exercise prescription. Introduces F.I.T.T principle from ACSM for exercise prescriptions.  Reviews F.I.T.T. principles of aerobic exercise including progression. Written material given at graduation. Flowsheet  Row Cardiac Rehab from 08/21/2022 in [facility] Cardiac and Pulmonary Rehab  Education need identified 08/21/22       Education: Resistance Exercise: - Group verbal and visual presentation on the components of exercise prescription. Introduces F.I.T.T principle from ACSM for exercise prescriptions  Reviews F.I.T.T. principles of resistance exercise including progression. Written material given at graduation.    Education: Exercise & Equipment Safety: - Individual verbal instruction and demonstration of equipment use and safety with use of the equipment. Flowsheet Row Cardiac Rehab from 08/21/2022  in [facility] Cardiac and Pulmonary Rehab  Date 08/13/22  Educator [facility]  Instruction Review Code 1- Verbalizes Understanding       Education: Exercise Physiology & General Exercise Guidelines: - Group verbal and written instruction with models to review the exercise physiology of the cardiovascular system and associated critical values. Provides general exercise guidelines with specific guidelines to those with heart or lung disease.    Education: Flexibility, Balance, Mind/Body Relaxation: - Group verbal and visual presentation with interactive activity on the components of exercise prescription. Introduces F.I.T.T principle from ACSM for exercise prescriptions. Reviews F.I.T.T. principles of flexibility and balance exercise training including progression. Also discusses the mind body connection.  Reviews various relaxation techniques to help reduce and manage stress (i.e. Deep breathing, progressive muscle relaxation, and visualization). Balance handout provided to take home. Written material given at graduation.   Activity Barriers & Risk Stratification:  Activity Barriers & Cardiac Risk Stratification - 08/21/22 1539       Activity Barriers & Cardiac Risk Stratification   Activity Barriers None    Cardiac Risk Stratification Moderate             6 Minute Walk:  6 Minute Walk     Row Name 08/21/22 1538         6 Minute Walk   Phase Initial     Distance 1355 feet     Walk Time 6 minutes     # of Rest Breaks 0     MPH 2.57     METS 3.02     RPE 11     Perceived Dyspnea  1     VO2 Peak 10.58     Symptoms Yes (comment)     Comments Hip Tightness     Resting HR 48 bpm     Resting BP 112/58     Resting Oxygen Saturation  100 %     Exercise Oxygen Saturation  during 6 min walk 94 %     Max Ex. HR 99 bpm     Max Ex. BP 126/68     2 Minute Post BP 114/58              Oxygen Initial Assessment:   Oxygen Re-Evaluation:   Oxygen Discharge (Final Oxygen  Re-Evaluation):   Initial Exercise Prescription:  Initial Exercise Prescription - 08/21/22 1600       Date of Initial Exercise RX and Referring Provider   Date 08/21/22    Referring Provider Isaias Cowman MD      Oxygen   Maintain Oxygen Saturation 88% or higher      Treadmill   MPH 1.8    Grade 0.5    Minutes 15    METs 2.57      Recumbant Bike   Level 2    RPM 50    Watts 32    Minutes 15    METs 2.57      NuStep   Level 2  SPM 80    Minutes 15    METs 2.57      REL-XR   Level 1    Speed 50    Minutes 15    METs 2.57      Biostep-RELP   Level 2    SPM 50    Minutes 15    METs 2.57      Track   Laps 35    Minutes 15    METs 2.57      Prescription Details   Frequency (times per week) 3    Duration Progress to 30 minutes of continuous aerobic without signs/symptoms of physical distress      Intensity   THRR 40-80% of Max Heartrate 89-129    Ratings of Perceived Exertion 11-13    Perceived Dyspnea 0-4      Progression   Progression Continue to progress workloads to maintain intensity without signs/symptoms of physical distress.      Resistance Training   Training Prescription Yes    Weight 5 lb    Reps 10-15             Perform Capillary Blood Glucose checks as needed.  Exercise Prescription Changes:   Exercise Prescription Changes     Row Name 08/21/22 1600             Response to Exercise   Blood Pressure (Admit) 112/58       Blood Pressure (Exercise) 126/68       Blood Pressure (Exit) 114/58       Heart Rate (Admit) 48 bpm       Heart Rate (Exercise) 99 bpm       Heart Rate (Exit) 55 bpm       Oxygen Saturation (Admit) 100 %       Oxygen Saturation (Exercise) 94 %       Rating of Perceived Exertion (Exercise) 11       Perceived Dyspnea (Exercise) 1       Symptoms Hip Tightness       Comments 6MWT Results                Exercise Comments:   Exercise Goals and Review:   Exercise Goals     Row Name  08/21/22 1539             Exercise Goals   Increase Physical Activity Yes       Intervention Provide advice, education, support and counseling about physical activity/exercise needs.;Develop an individualized exercise prescription for aerobic and resistive training based on initial evaluation findings, risk stratification, comorbidities and participant's personal goals.       Expected Outcomes Short Term: Attend rehab on a regular basis to increase amount of physical activity.;Long Term: Add in home exercise to make exercise part of routine and to increase amount of physical activity.;Long Term: Exercising regularly at least 3-5 days a week.       Increase Strength and Stamina Yes       Intervention Provide advice, education, support and counseling about physical activity/exercise needs.;Develop an individualized exercise prescription for aerobic and resistive training based on initial evaluation findings, risk stratification, comorbidities and participant's personal goals.       Expected Outcomes Long Term: Improve cardiorespiratory fitness, muscular endurance and strength as measured by increased METs and functional capacity (6MWT);Short Term: Increase workloads from initial exercise prescription for resistance, speed, and METs.;Short Term: Perform resistance training exercises routinely during rehab and add in resistance training at  home       Able to understand and use rate of perceived exertion (RPE) scale Yes       Intervention Provide education and explanation on how to use RPE scale       Expected Outcomes Short Term: Able to use RPE daily in rehab to express subjective intensity level;Long Term:  Able to use RPE to guide intensity level when exercising independently       Able to understand and use Dyspnea scale Yes       Intervention Provide education and explanation on how to use Dyspnea scale       Expected Outcomes Short Term: Able to use Dyspnea scale daily in rehab to express  subjective sense of shortness of breath during exertion;Long Term: Able to use Dyspnea scale to guide intensity level when exercising independently       Knowledge and understanding of Target Heart Rate Range (THRR) Yes       Intervention Provide education and explanation of THRR including how the numbers were predicted and where they are located for reference       Expected Outcomes Short Term: Able to state/look up THRR;Long Term: Able to use THRR to govern intensity when exercising independently;Short Term: Able to use daily as guideline for intensity in rehab       Able to check pulse independently Yes       Intervention Provide education and demonstration on how to check pulse in carotid and radial arteries.;Review the importance of being able to check your own pulse for safety during independent exercise       Expected Outcomes Short Term: Able to explain why pulse checking is important during independent exercise;Long Term: Able to check pulse independently and accurately       Understanding of Exercise Prescription Yes       Intervention Provide education, explanation, and written materials on patient's individual exercise prescription       Expected Outcomes Short Term: Able to explain program exercise prescription;Long Term: Able to explain home exercise prescription to exercise independently                Exercise Goals Re-Evaluation :   Discharge Exercise Prescription (Final Exercise Prescription Changes):  Exercise Prescription Changes - 08/21/22 1600       Response to Exercise   Blood Pressure (Admit) 112/58    Blood Pressure (Exercise) 126/68    Blood Pressure (Exit) 114/58    Heart Rate (Admit) 48 bpm    Heart Rate (Exercise) 99 bpm    Heart Rate (Exit) 55 bpm    Oxygen Saturation (Admit) 100 %    Oxygen Saturation (Exercise) 94 %    Rating of Perceived Exertion (Exercise) 11    Perceived Dyspnea (Exercise) 1    Symptoms Hip Tightness    Comments 6MWT Results              Nutrition:  Target Goals: Understanding of nutrition guidelines, daily intake of sodium '1500mg'$ , cholesterol '200mg'$ , calories 30% from fat and 7% or less from saturated fats, daily to have 5 or more servings of fruits and vegetables.  Education: All About Nutrition: -Group instruction provided by verbal, written material, interactive activities, discussions, models, and posters to present general guidelines for heart healthy nutrition including fat, fiber, MyPlate, the role of sodium in heart healthy nutrition, utilization of the nutrition label, and utilization of this knowledge for meal planning. Follow up email sent as well. Written material given at graduation.  Biometrics:  Pre Biometrics - 08/21/22 1540       Pre Biometrics   Height 6' 2.5" (1.892 m)    Weight 221 lb 1.6 oz (100.3 kg)    Waist Circumference 38 inches    Hip Circumference 43 inches    Waist to Hip Ratio 0.88 %    BMI (Calculated) 28.02    Single Leg Stand 16.1 seconds   R             Nutrition Therapy Plan and Nutrition Goals:  Nutrition Therapy & Goals - 08/21/22 1050       Nutrition Therapy   Diet Heart healthy, low Na    Drug/Food Interactions Statins/Certain Fruits    Protein (specify units) 80-90g    Fiber 30 grams    Whole Grain Foods 3 servings    Saturated Fats 16 max. grams    Fruits and Vegetables 8 servings/day    Sodium 2 grams      Personal Nutrition Goals   Nutrition Goal ST: soymilk instead of almond milk, vegetarian protein to help add protein to meatless meals LT: practice MyPlate guidelines, meet protein needs    Comments 71 y.o. M admitted to cardiac rehab s/p stent. PMHx includes A.fib, CAD, OSA, prostate cancer, HLD. Relevant medications includes lipitor, vit B12, MVI w/ minerals. B: he does not like to eat in am, he likes coffee (some cream and stevia) L: smoothies with fruit (banana and berries) and cottage cheese or greek yogurt as well as almond milk and  ground flaxseed. D: big baked potato with stuff on it, sometimes daughter will give him food like roast beef which he will add some vegetables to, he likes eggs with whole wheat toast with sometimes some deli meat, minestrone soup, potato leek soup. he reports he will sometimes snack on some chips as well as nuts like walnuts. Drinks: water. Discussed general heart healthy eating and protein needs; recommended soymilk for smoothies instead of almond milk and vegetarian protein to meals without meat/chicken/fish such as greek yogurt, beans/lentils, eggs (also encouraged foods that increase protein moderately such as whole grains).      Intervention Plan   Intervention Prescribe, educate and counsel regarding individualized specific dietary modifications aiming towards targeted core components such as weight, hypertension, lipid management, diabetes, heart failure and other comorbidities.;Nutrition handout(s) given to patient.    Expected Outcomes Short Term Goal: Understand basic principles of dietary content, such as calories, fat, sodium, cholesterol and nutrients.;Short Term Goal: A plan has been developed with personal nutrition goals set during dietitian appointment.;Long Term Goal: Adherence to prescribed nutrition plan.             Nutrition Assessments:  MEDIFICTS Score Key: ?70 Need to make dietary changes  40-70 Heart Healthy Diet ? 40 Therapeutic Level Cholesterol Diet  Flowsheet Row Cardiac Rehab from 08/21/2022 in [facility] Cardiac and Pulmonary Rehab  Picture Your Plate Total Score on Admission 70      Picture Your Plate Scores: <98 Unhealthy dietary pattern with much room for improvement. 41-50 Dietary pattern unlikely to meet recommendations for good health and room for improvement. 51-60 More healthful dietary pattern, with some room for improvement.  >60 Healthy dietary pattern, although there may be some specific behaviors that could be improved.    Nutrition Goals  Re-Evaluation:   Nutrition Goals Discharge (Final Nutrition Goals Re-Evaluation):   Psychosocial: Target Goals: Acknowledge presence or absence of significant depression and/or stress, maximize coping skills, provide positive support system. Participant  is able to verbalize types and ability to use techniques and skills needed for reducing stress and depression.   Education: Stress, Anxiety, and Depression - Group verbal and visual presentation to define topics covered.  Reviews how body is impacted by stress, anxiety, and depression.  Also discusses healthy ways to reduce stress and to treat/manage anxiety and depression.  Written material given at graduation.   Education: Sleep Hygiene -Provides group verbal and written instruction about how sleep can affect your health.  Define sleep hygiene, discuss sleep cycles and impact of sleep habits. Review good sleep hygiene tips.    Initial Review & Psychosocial Screening:  Initial Psych Review & Screening - 08/13/22 1347       Initial Review   Current issues with None Identified      Family Dynamics   Good Support System? Yes    Comments He can look to his daughter for support. He lives alone with his daughter that lives close to him. Lashon has no dression or anxiety issues.      Barriers   Psychosocial barriers to participate in program The patient should benefit from training in stress management and relaxation.      Screening Interventions   Interventions Encouraged to exercise;To provide support and resources with identified psychosocial needs;Provide feedback about the scores to participant    Expected Outcomes Short Term goal: Utilizing psychosocial counselor, staff and physician to assist with identification of specific Stressors or current issues interfering with healing process. Setting desired goal for each stressor or current issue identified.;Long Term Goal: Stressors or current issues are controlled or eliminated.;Short Term  goal: Identification and review with participant of any Quality of Life or Depression concerns found by scoring the questionnaire.;Long Term goal: The participant improves quality of Life and PHQ9 Scores as seen by post scores and/or verbalization of changes             Quality of Life Scores:   Quality of Life - 08/21/22 1515       Quality of Life   Select Quality of Life      Quality of Life Scores   Health/Function Pre 27.37 %    Socioeconomic Pre 30 %    Psych/Spiritual Pre 27.93 %    Family Pre 27.8 %    GLOBAL Pre 28.09 %            Scores of 19 and below usually indicate a poorer quality of life in these areas.  A difference of  2-3 points is a clinically meaningful difference.  A difference of 2-3 points in the total score of the Quality of Life Index has been associated with significant improvement in overall quality of life, self-image, physical symptoms, and general health in studies assessing change in quality of life.  PHQ-9: Review Flowsheet       08/21/2022  Depression screen PHQ 2/9  Decreased Interest 0  Down, Depressed, Hopeless 0  PHQ - 2 Score 0  Altered sleeping 0  Tired, decreased energy 0  Change in appetite 0  Feeling bad or failure about yourself  0  Trouble concentrating 0  Moving slowly or fidgety/restless 0  Suicidal thoughts 0  PHQ-9 Score 0  Difficult doing work/chores Not difficult at all   Interpretation of Total Score  Total Score Depression Severity:  1-4 = Minimal depression, 5-9 = Mild depression, 10-14 = Moderate depression, 15-19 = Moderately severe depression, 20-27 = Severe depression   Psychosocial Evaluation and Intervention:  Psychosocial Evaluation -  08/13/22 1349       Psychosocial Evaluation & Interventions   Interventions Encouraged to exercise with the program and follow exercise prescription;Relaxation education;Stress management education    Comments He can look to his daughter for support. He lives alone with  his daughter that lives close to him. Jabes has no dression or anxiety issues.    Expected Outcomes Short: Start HeartTrack to help with mood. Long: Maintain a healthy mental state    Continue Psychosocial Services  Follow up required by staff             Psychosocial Re-Evaluation:   Psychosocial Discharge (Final Psychosocial Re-Evaluation):   Vocational Rehabilitation: Provide vocational rehab assistance to qualifying candidates.   Vocational Rehab Evaluation & Intervention:   Education: Education Goals: Education classes will be provided on a variety of topics geared toward better understanding of heart health and risk factor modification. Participant will state understanding/return demonstration of topics presented as noted by education test scores.  Learning Barriers/Preferences:  Learning Barriers/Preferences - 08/13/22 1342       Learning Barriers/Preferences   Learning Barriers None    Learning Preferences None             General Cardiac Education Topics:  AED/CPR: - Group verbal and written instruction with the use of models to demonstrate the basic use of the AED with the basic ABC's of resuscitation.   Anatomy and Cardiac Procedures: - Group verbal and visual presentation and models provide information about basic cardiac anatomy and function. Reviews the testing methods done to diagnose heart disease and the outcomes of the test results. Describes the treatment choices: Medical Management, Angioplasty, or Coronary Bypass Surgery for treating various heart conditions including Myocardial Infarction, Angina, Valve Disease, and Cardiac Arrhythmias.  Written material given at graduation. Flowsheet Row Cardiac Rehab from 08/21/2022 in [facility] Cardiac and Pulmonary Rehab  Education need identified 08/21/22       Medication Safety: - Group verbal and visual instruction to review commonly prescribed medications for heart and lung disease. Reviews the medication,  class of the drug, and side effects. Includes the steps to properly store meds and maintain the prescription regimen.  Written material given at graduation.   Intimacy: - Group verbal instruction through game format to discuss how heart and lung disease can affect sexual intimacy. Written material given at graduation..   Know Your Numbers and Heart Failure: - Group verbal and visual instruction to discuss disease risk factors for cardiac and pulmonary disease and treatment options.  Reviews associated critical values for Overweight/Obesity, Hypertension, Cholesterol, and Diabetes.  Discusses basics of heart failure: signs/symptoms and treatments.  Introduces Heart Failure Zone chart for action plan for heart failure.  Written material given at graduation.   Infection Prevention: - Provides verbal and written material to individual with discussion of infection control including proper hand washing and proper equipment cleaning during exercise session. Flowsheet Row Cardiac Rehab from 08/21/2022 in [facility] Cardiac and Pulmonary Rehab  Date 08/13/22  Educator [facility]  Instruction Review Code 1- Verbalizes Understanding       Falls Prevention: - Provides verbal and written material to individual with discussion of falls prevention and safety. Flowsheet Row Cardiac Rehab from 08/21/2022 in [facility] Cardiac and Pulmonary Rehab  Date 08/13/22  Educator [facility]  Instruction Review Code 1- Verbalizes Understanding       Other: -Provides group and verbal instruction on various topics (see comments)   Knowledge Questionnaire Score:  Knowledge Questionnaire Score - 08/21/22 1516  Knowledge Questionnaire Score   Pre Score 23/26             Core Components/Risk Factors/Patient Goals at Admission:  Personal Goals and Risk Factors at Admission - 08/13/22 1342       Core Components/Risk Factors/Patient Goals on Admission    Weight Management Yes;Weight Loss    Intervention Weight Management:  Develop a combined nutrition and exercise program designed to reach desired caloric intake, while maintaining appropriate intake of nutrient and fiber, sodium and fats, and appropriate energy expenditure required for the weight goal.;Weight Management: Provide education and appropriate resources to help participant work on and attain dietary goals.;Weight Management/Obesity: Establish reasonable short term and long term weight goals.    Expected Outcomes Short Term: Continue to assess and modify interventions until short term weight is achieved;Long Term: Adherence to nutrition and physical activity/exercise program aimed toward attainment of established weight goal;Weight Loss: Understanding of general recommendations for a balanced deficit meal plan, which promotes 1-2 lb weight loss per week and includes a negative energy balance of [phone] kcal/d;Understanding recommendations for meals to include 15-35% energy as protein, 25-35% energy from fat, 35-60% energy from carbohydrates, less than '200mg'$  of dietary cholesterol, 20-35 gm of total fiber daily;Understanding of distribution of calorie intake throughout the day with the consumption of 4-5 meals/snacks    Lipids Yes    Intervention Provide education and support for participant on nutrition & aerobic/resistive exercise along with prescribed medications to achieve LDL '70mg'$ , HDL >'40mg'$ .    Expected Outcomes Short Term: Participant states understanding of desired cholesterol values and is compliant with medications prescribed. Participant is following exercise prescription and nutrition guidelines.;Long Term: Cholesterol controlled with medications as prescribed, with individualized exercise RX and with personalized nutrition plan. Value goals: LDL < '70mg'$ , HDL > 40 mg.             Education:Diabetes - Individual verbal and written instruction to review signs/symptoms of diabetes, desired ranges of glucose level fasting, after meals and with exercise.  Acknowledge that pre and post exercise glucose checks will be done for 3 sessions at entry of program.   Core Components/Risk Factors/Patient Goals Review:    Core Components/Risk Factors/Patient Goals at Discharge (Final Review):    ITP Comments:  ITP Comments     Row Name 08/13/22 1341 08/21/22 1537         ITP Comments Virtual Visit completed. Patient informed on EP and RD appointment and 6 Minute walk test. Patient also informed of patient health questionnaires on My Chart. Patient Verbalizes understanding. Visit diagnosis can be found in [facility] 07/31/22. Completed 6MWT and gym orientation. Initial ITP created and sent for review to Dr. Emily Filbert, Medical Director.               Comments: Initial ITP

## 2022-08-21 NOTE — Patient Instructions (Signed)
Patient Instructions  Patient Details  Name: Joshua Barajas MRN: [id number] Date of Birth: 1951/05/25 Referring Provider:  Isaias Cowman, MD  Below are your personal goals for exercise, nutrition, and risk factors. Our goal is to help you stay on track towards obtaining and maintaining these goals. We will be discussing your progress on these goals with you throughout the program.  Initial Exercise Prescription:  Initial Exercise Prescription - 08/21/22 1600       Date of Initial Exercise RX and Referring Provider   Date 08/21/22    Referring Provider Isaias Cowman MD      Oxygen   Maintain Oxygen Saturation 88% or higher      Treadmill   MPH 1.8    Grade 0.5    Minutes 15    METs 2.57      Recumbant Bike   Level 2    RPM 50    Watts 32    Minutes 15    METs 2.57      NuStep   Level 2    SPM 80    Minutes 15    METs 2.57      REL-XR   Level 1    Speed 50    Minutes 15    METs 2.57      Biostep-RELP   Level 2    SPM 50    Minutes 15    METs 2.57      Track   Laps 35    Minutes 15    METs 2.57      Prescription Details   Frequency (times per week) 3    Duration Progress to 30 minutes of continuous aerobic without signs/symptoms of physical distress      Intensity   THRR 40-80% of Max Heartrate 89-129    Ratings of Perceived Exertion 11-13    Perceived Dyspnea 0-4      Progression   Progression Continue to progress workloads to maintain intensity without signs/symptoms of physical distress.      Resistance Training   Training Prescription Yes    Weight 5 lb    Reps 10-15             Exercise Goals: Frequency: Be able to perform aerobic exercise two to three times per week in program working toward 2-5 days per week of home exercise.  Intensity: Work with a perceived exertion of 11 (fairly light) - 15 (hard) while following your exercise prescription.  We will make changes to your prescription with you as you progress through  the program.   Duration: Be able to do 30 to 45 minutes of continuous aerobic exercise in addition to a 5 minute warm-up and a 5 minute cool-down routine.   Nutrition Goals: Your personal nutrition goals will be established when you do your nutrition analysis with the dietician.  The following are general nutrition guidelines to follow: Cholesterol < '200mg'$ /day Sodium < '1500mg'$ /day Fiber: Men over 50 yrs - 30 grams per day  Personal Goals:  Personal Goals and Risk Factors at Admission - 08/13/22 1342       Core Components/Risk Factors/Patient Goals on Admission    Weight Management Yes;Weight Loss    Intervention Weight Management: Develop a combined nutrition and exercise program designed to reach desired caloric intake, while maintaining appropriate intake of nutrient and fiber, sodium and fats, and appropriate energy expenditure required for the weight goal.;Weight Management: Provide education and appropriate resources to help participant work on and attain dietary  goals.;Weight Management/Obesity: Establish reasonable short term and long term weight goals.    Expected Outcomes Short Term: Continue to assess and modify interventions until short term weight is achieved;Long Term: Adherence to nutrition and physical activity/exercise program aimed toward attainment of established weight goal;Weight Loss: Understanding of general recommendations for a balanced deficit meal plan, which promotes 1-2 lb weight loss per week and includes a negative energy balance of [phone] kcal/d;Understanding recommendations for meals to include 15-35% energy as protein, 25-35% energy from fat, 35-60% energy from carbohydrates, less than '200mg'$  of dietary cholesterol, 20-35 gm of total fiber daily;Understanding of distribution of calorie intake throughout the day with the consumption of 4-5 meals/snacks    Lipids Yes    Intervention Provide education and support for participant on nutrition & aerobic/resistive  exercise along with prescribed medications to achieve LDL '70mg'$ , HDL >'40mg'$ .    Expected Outcomes Short Term: Participant states understanding of desired cholesterol values and is compliant with medications prescribed. Participant is following exercise prescription and nutrition guidelines.;Long Term: Cholesterol controlled with medications as prescribed, with individualized exercise RX and with personalized nutrition plan. Value goals: LDL < '70mg'$ , HDL > 40 mg.             Tobacco Use Initial Evaluation: Social History   Tobacco Use  Smoking Status Former   Packs/day: 1.00   Years: 11.00   Total pack years: 11.00   Types: Cigarettes   Quit date: 04/10/1976   Years since quitting: 46.3  Smokeless Tobacco Never    Exercise Goals and Review:  Exercise Goals     Row Name 08/21/22 1539             Exercise Goals   Increase Physical Activity Yes       Intervention Provide advice, education, support and counseling about physical activity/exercise needs.;Develop an individualized exercise prescription for aerobic and resistive training based on initial evaluation findings, risk stratification, comorbidities and participant's personal goals.       Expected Outcomes Short Term: Attend rehab on a regular basis to increase amount of physical activity.;Long Term: Add in home exercise to make exercise part of routine and to increase amount of physical activity.;Long Term: Exercising regularly at least 3-5 days a week.       Increase Strength and Stamina Yes       Intervention Provide advice, education, support and counseling about physical activity/exercise needs.;Develop an individualized exercise prescription for aerobic and resistive training based on initial evaluation findings, risk stratification, comorbidities and participant's personal goals.       Expected Outcomes Long Term: Improve cardiorespiratory fitness, muscular endurance and strength as measured by increased METs and functional  capacity (6MWT);Short Term: Increase workloads from initial exercise prescription for resistance, speed, and METs.;Short Term: Perform resistance training exercises routinely during rehab and add in resistance training at home       Able to understand and use rate of perceived exertion (RPE) scale Yes       Intervention Provide education and explanation on how to use RPE scale       Expected Outcomes Short Term: Able to use RPE daily in rehab to express subjective intensity level;Long Term:  Able to use RPE to guide intensity level when exercising independently       Able to understand and use Dyspnea scale Yes       Intervention Provide education and explanation on how to use Dyspnea scale       Expected Outcomes Short Term: Able to  use Dyspnea scale daily in rehab to express subjective sense of shortness of breath during exertion;Long Term: Able to use Dyspnea scale to guide intensity level when exercising independently       Knowledge and understanding of Target Heart Rate Range (THRR) Yes       Intervention Provide education and explanation of THRR including how the numbers were predicted and where they are located for reference       Expected Outcomes Short Term: Able to state/look up THRR;Long Term: Able to use THRR to govern intensity when exercising independently;Short Term: Able to use daily as guideline for intensity in rehab       Able to check pulse independently Yes       Intervention Provide education and demonstration on how to check pulse in carotid and radial arteries.;Review the importance of being able to check your own pulse for safety during independent exercise       Expected Outcomes Short Term: Able to explain why pulse checking is important during independent exercise;Long Term: Able to check pulse independently and accurately       Understanding of Exercise Prescription Yes       Intervention Provide education, explanation, and written materials on patient's individual  exercise prescription       Expected Outcomes Short Term: Able to explain program exercise prescription;Long Term: Able to explain home exercise prescription to exercise independently

## 2022-08-25 ENCOUNTER — Encounter: Payer: PPO | Admitting: *Deleted

## 2022-08-25 DIAGNOSIS — Z955 Presence of coronary angioplasty implant and graft: Secondary | ICD-10-CM | POA: Diagnosis not present

## 2022-08-25 NOTE — Progress Notes (Signed)
Daily Session Note  Patient Details  Name: Joshua Barajas MRN: [id number] Date of Birth: 11-14-1950 Referring Provider:   Flowsheet Row Cardiac Rehab from 08/21/2022 in [facility] Cardiac and Pulmonary Rehab  Referring Provider Isaias Cowman MD       Encounter Date: 08/25/2022  Check In:  Session Check In - 08/25/22 1024       Check-In   Supervising physician immediately available to respond to emergencies See telemetry face sheet for immediately available ER MD    Location [facility]-Cardiac & Pulmonary Rehab    Staff Present Antionette Fairy, BS, Exercise Physiologist;Kelly Amedeo Plenty, BS, ACSM CEP, Exercise Physiologist;Shawnelle Spoerl Tamala Julian, RN, ADN    Virtual Visit No    Medication changes reported     No    Fall or balance concerns reported    No    Warm-up and Cool-down Performed on first and last piece of equipment    Resistance Training Performed Yes    VAD Patient? No    PAD/SET Patient? No      Pain Assessment   Currently in Pain? No/denies                Social History   Tobacco Use  Smoking Status Former   Packs/day: 1.00   Years: 11.00   Total pack years: 11.00   Types: Cigarettes   Quit date: 04/10/1976   Years since quitting: 46.4  Smokeless Tobacco Never    Goals Met:  Independence with exercise equipment Exercise tolerated well No report of concerns or symptoms today Strength training completed today  Goals Unmet:  Not Applicable  Comments: First full day of exercise!  Patient was oriented to gym and equipment including functions, settings, policies, and procedures.  Patient's individual exercise prescription and treatment plan were reviewed.  All starting workloads were established based on the results of the 6 minute walk test done at initial orientation visit.  The plan for exercise progression was also introduced and progression will be customized based on patient's performance and goals.    Dr. Emily Filbert is Medical Director for [place].  Dr. Ottie Glazier is Medical Director for [facility] Pulmonary Rehabilitation.

## 2022-08-26 DIAGNOSIS — H903 Sensorineural hearing loss, bilateral: Secondary | ICD-10-CM | POA: Diagnosis not present

## 2022-08-27 ENCOUNTER — Encounter: Payer: PPO | Admitting: *Deleted

## 2022-08-27 DIAGNOSIS — Z955 Presence of coronary angioplasty implant and graft: Secondary | ICD-10-CM

## 2022-08-27 NOTE — Progress Notes (Signed)
Daily Session Note  Patient Details  Name: Joshua Barajas MRN: [id number] Date of Birth: 1951-06-14 Referring Provider:   Flowsheet Row Cardiac Rehab from 08/21/2022 in [facility] Cardiac and Pulmonary Rehab  Referring Provider Isaias Cowman MD       Encounter Date: 08/27/2022  Check In:  Session Check In - 08/27/22 1109       Check-In   Supervising physician immediately available to respond to emergencies See telemetry face sheet for immediately available ER MD    Location [facility]-Cardiac & Pulmonary Rehab    Staff Present Justin Mend, RCP,RRT,BSRT;Noah Tickle, BS, Exercise Physiologist;Kalandra Masters Sherryll Burger, RN Odelia Gage, RN, ADN    Virtual Visit No    Medication changes reported     No    Fall or balance concerns reported    No    Warm-up and Cool-down Performed on first and last piece of equipment    Resistance Training Performed Yes    VAD Patient? No    PAD/SET Patient? No      Pain Assessment   Currently in Pain? No/denies                Social History   Tobacco Use  Smoking Status Former   Packs/day: 1.00   Years: 11.00   Total pack years: 11.00   Types: Cigarettes   Quit date: 04/10/1976   Years since quitting: 46.4  Smokeless Tobacco Never    Goals Met:  Independence with exercise equipment Exercise tolerated well No report of concerns or symptoms today Strength training completed today  Goals Unmet:  Not Applicable  Comments: Pt able to follow exercise prescription today without complaint.  Will continue to monitor for progression.    Dr. Emily Filbert is Medical Director for [place].  Dr. Ottie Glazier is Medical Director for [facility] Pulmonary Rehabilitation.

## 2022-08-29 ENCOUNTER — Encounter: Payer: PPO | Admitting: *Deleted

## 2022-08-29 DIAGNOSIS — Z955 Presence of coronary angioplasty implant and graft: Secondary | ICD-10-CM | POA: Diagnosis not present

## 2022-08-29 NOTE — Progress Notes (Signed)
Daily Session Note  Patient Details  Name: Joshua Barajas MRN: [id number] Date of Birth: 01/08/1951 Referring Provider:   Flowsheet Row Cardiac Rehab from 08/21/2022 in [facility] Cardiac and Pulmonary Rehab  Referring Provider Isaias Cowman MD       Encounter Date: 08/29/2022  Check In:  Session Check In - 08/29/22 1046       Check-In   Supervising physician immediately available to respond to emergencies See telemetry face sheet for immediately available ER MD    Location [facility]-Cardiac & Pulmonary Rehab    Staff Present Heath Lark, RN, BSN, CCRP;Jessica Joanna, MA, RCEP, CCRP, CCET;Joseph Perry, [place]    Virtual Visit No    Medication changes reported     No    Fall or balance concerns reported    No    Warm-up and Cool-down Performed on first and last piece of equipment    Resistance Training Performed Yes    VAD Patient? No    PAD/SET Patient? No      Pain Assessment   Currently in Pain? No/denies                Social History   Tobacco Use  Smoking Status Former   Packs/day: 1.00   Years: 11.00   Total pack years: 11.00   Types: Cigarettes   Quit date: 04/10/1976   Years since quitting: 46.4  Smokeless Tobacco Never    Goals Met:  Independence with exercise equipment Exercise tolerated well No report of concerns or symptoms today  Goals Unmet:  Not Applicable  Comments: Pt able to follow exercise prescription today without complaint.  Will continue to monitor for progression.    Dr. Emily Filbert is Medical Director for [place].  Dr. Ottie Glazier is Medical Director for [facility] Pulmonary Rehabilitation.

## 2022-09-01 ENCOUNTER — Encounter: Payer: PPO | Admitting: *Deleted

## 2022-09-01 DIAGNOSIS — Z955 Presence of coronary angioplasty implant and graft: Secondary | ICD-10-CM

## 2022-09-01 NOTE — Progress Notes (Signed)
Daily Session Note  Patient Details  Name: Joshua Barajas MRN: [id number] Date of Birth: 01-05-1951 Referring Provider:   Flowsheet Row Cardiac Rehab from 08/21/2022 in [facility] Cardiac and Pulmonary Rehab  Referring Provider Isaias Cowman MD       Encounter Date: 09/01/2022  Check In:  Session Check In - 09/01/22 1004       Check-In   Supervising physician immediately available to respond to emergencies See telemetry face sheet for immediately available ER MD    Location [facility]-Cardiac & Pulmonary Rehab    Staff Present Antionette Fairy, BS, Exercise Physiologist;Kelly Amedeo Plenty, BS, ACSM CEP, Exercise Physiologist;Kayleb Warshaw Tamala Julian, RN, ADN    Virtual Visit No    Medication changes reported     No    Fall or balance concerns reported    No    Warm-up and Cool-down Performed on first and last piece of equipment    Resistance Training Performed Yes    VAD Patient? No    PAD/SET Patient? No      Pain Assessment   Currently in Pain? No/denies                Social History   Tobacco Use  Smoking Status Former   Packs/day: 1.00   Years: 11.00   Total pack years: 11.00   Types: Cigarettes   Quit date: 04/10/1976   Years since quitting: 46.4  Smokeless Tobacco Never    Goals Met:  Independence with exercise equipment Exercise tolerated well No report of concerns or symptoms today Strength training completed today  Goals Unmet:  Not Applicable  Comments: Pt able to follow exercise prescription today without complaint.  Will continue to monitor for progression.    Dr. Emily Filbert is Medical Director for [place].  Dr. Ottie Glazier is Medical Director for [facility] Pulmonary Rehabilitation.

## 2022-09-03 ENCOUNTER — Encounter: Payer: PPO | Admitting: *Deleted

## 2022-09-03 DIAGNOSIS — Z955 Presence of coronary angioplasty implant and graft: Secondary | ICD-10-CM | POA: Diagnosis not present

## 2022-09-03 NOTE — Progress Notes (Signed)
Daily Session Note  Patient Details  Name: Joshua Barajas MRN: [id number] Date of Birth: 1950/10/31 Referring Provider:   Flowsheet Row Cardiac Rehab from 08/21/2022 in [facility] Cardiac and Pulmonary Rehab  Referring Provider Isaias Cowman MD       Encounter Date: 09/03/2022  Check In:  Session Check In - 09/03/22 1008       Check-In   Supervising physician immediately available to respond to emergencies See telemetry face sheet for immediately available ER MD    Location [facility]-Cardiac & Pulmonary Rehab    Staff Present Antionette Fairy, BS, Exercise Physiologist;Joseph Rosebud Poles, RN, [place]    Virtual Visit No    Medication changes reported     No    Fall or balance concerns reported    No    Warm-up and Cool-down Performed on first and last piece of equipment    Resistance Training Performed Yes    VAD Patient? No    PAD/SET Patient? No      Pain Assessment   Currently in Pain? No/denies                Social History   Tobacco Use  Smoking Status Former   Packs/day: 1.00   Years: 11.00   Total pack years: 11.00   Types: Cigarettes   Quit date: 04/10/1976   Years since quitting: 46.4  Smokeless Tobacco Never    Goals Met:  Independence with exercise equipment Exercise tolerated well No report of concerns or symptoms today Strength training completed today  Goals Unmet:  Not Applicable  Comments: Pt able to follow exercise prescription today without complaint.  Will continue to monitor for progression.    Dr. Emily Filbert is Medical Director for [place].  Dr. Ottie Glazier is Medical Director for [facility] Pulmonary Rehabilitation.

## 2022-09-05 ENCOUNTER — Encounter: Payer: PPO | Admitting: *Deleted

## 2022-09-05 DIAGNOSIS — Z955 Presence of coronary angioplasty implant and graft: Secondary | ICD-10-CM | POA: Diagnosis not present

## 2022-09-05 NOTE — Progress Notes (Signed)
Daily Session Note  Patient Details  Name: Joshua Barajas MRN: [id number] Date of Birth: May 01, 1951 Referring Provider:   Flowsheet Row Cardiac Rehab from 08/21/2022 in [facility] Cardiac and Pulmonary Rehab  Referring Provider Isaias Cowman MD       Encounter Date: 09/05/2022  Check In:  Session Check In - 09/05/22 1000       Check-In   Supervising physician immediately available to respond to emergencies See telemetry face sheet for immediately available ER MD    Location [facility]-Cardiac & Pulmonary Rehab    Staff Present Alberteen Sam, MA, RCEP, CCRP, CCET;Joseph Beckley, [place], RN, [place]    Virtual Visit No    Medication changes reported     No    Fall or balance concerns reported    No    Warm-up and Cool-down Performed on first and last piece of equipment    Resistance Training Performed Yes    VAD Patient? No    PAD/SET Patient? No      Pain Assessment   Currently in Pain? No/denies                Social History   Tobacco Use  Smoking Status Former   Packs/day: 1.00   Years: 11.00   Total pack years: 11.00   Types: Cigarettes   Quit date: 04/10/1976   Years since quitting: 46.4  Smokeless Tobacco Never    Goals Met:  Independence with exercise equipment Exercise tolerated well No report of concerns or symptoms today Strength training completed today  Goals Unmet:  Not Applicable  Comments: Pt able to follow exercise prescription today without complaint.  Will continue to monitor for progression.    Dr. Emily Filbert is Medical Director for [place].  Dr. Ottie Glazier is Medical Director for [facility] Pulmonary Rehabilitation.

## 2022-09-08 ENCOUNTER — Encounter: Payer: PPO | Admitting: *Deleted

## 2022-09-08 DIAGNOSIS — Z955 Presence of coronary angioplasty implant and graft: Secondary | ICD-10-CM | POA: Diagnosis not present

## 2022-09-08 NOTE — Progress Notes (Signed)
Daily Session Note  Patient Details  Name: Joshua Barajas MRN: [id number] Date of Birth: Jun 30, 1951 Referring Provider:   Flowsheet Row Cardiac Rehab from 08/21/2022 in [facility] Cardiac and Pulmonary Rehab  Referring Provider Isaias Cowman MD       Encounter Date: 09/08/2022  Check In:  Session Check In - 09/08/22 1001       Check-In   Supervising physician immediately available to respond to emergencies See telemetry face sheet for immediately available ER MD    Location [facility]-Cardiac & Pulmonary Rehab    Staff Present Renita Papa, RN Odelia Gage, RN, Doyce Para, BS, ACSM CEP, Exercise Physiologist;Noah Tickle, BS, Exercise Physiologist    Virtual Visit No    Medication changes reported     No    Fall or balance concerns reported    No    Warm-up and Cool-down Performed on first and last piece of equipment    Resistance Training Performed Yes    VAD Patient? No    PAD/SET Patient? No      Pain Assessment   Currently in Pain? No/denies                Social History   Tobacco Use  Smoking Status Former   Packs/day: 1.00   Years: 11.00   Total pack years: 11.00   Types: Cigarettes   Quit date: 04/10/1976   Years since quitting: 46.4  Smokeless Tobacco Never    Goals Met:  Independence with exercise equipment Exercise tolerated well No report of concerns or symptoms today Strength training completed today  Goals Unmet:  Not Applicable  Comments: Pt able to follow exercise prescription today without complaint.  Will continue to monitor for progression.    Dr. Emily Filbert is Medical Director for [place].  Dr. Ottie Glazier is Medical Director for [facility] Pulmonary Rehabilitation.

## 2022-09-10 ENCOUNTER — Encounter: Payer: PPO | Admitting: *Deleted

## 2022-09-10 DIAGNOSIS — Z955 Presence of coronary angioplasty implant and graft: Secondary | ICD-10-CM | POA: Diagnosis not present

## 2022-09-10 NOTE — Progress Notes (Signed)
Daily Session Note  Patient Details  Name: Joshua Barajas MRN: [id number] Date of Birth: 07/01/1951 Referring Provider:   Flowsheet Row Cardiac Rehab from 08/21/2022 in [facility] Cardiac and Pulmonary Rehab  Referring Provider Alexander Paraschos MD       Encounter Date: 09/10/2022  Check In:  Session Check In - 09/10/22 1035       Check-In   Supervising physician immediately available to respond to emergencies See telemetry face sheet for immediately available ER MD    Location [facility]-Cardiac & Pulmonary Rehab    Staff Present Susanne Bice, RN, BSN, CCRP;Meredith Craven, RN BSN;Megan Smith, RN, ADN;Kara Langdon, MS, ASCM CEP, Exercise Physiologist    Virtual Visit No    Medication changes reported     No    Fall or balance concerns reported    No    Warm-up and Cool-down Performed on first and last piece of equipment    Resistance Training Performed Yes    VAD Patient? No    PAD/SET Patient? No      Pain Assessment   Currently in Pain? No/denies                Social History   Tobacco Use  Smoking Status Former   Packs/day: 1.00   Years: 11.00   Total pack years: 11.00   Types: Cigarettes   Quit date: 04/10/1976   Years since quitting: 46.4  Smokeless Tobacco Never    Goals Met:  Independence with exercise equipment Exercise tolerated well No report of concerns or symptoms today Strength training completed today  Goals Unmet:  Not Applicable  Comments: Pt able to follow exercise prescription today without complaint.  Will continue to monitor for progression.    Dr. Mark Miller is Medical Director for [facility] Cardiac Rehabilitation.  Dr. Fuad Aleskerov is Medical Director for [facility] Pulmonary Rehabilitation. 

## 2022-09-15 ENCOUNTER — Encounter: Payer: PPO | Admitting: *Deleted

## 2022-09-15 DIAGNOSIS — Z955 Presence of coronary angioplasty implant and graft: Secondary | ICD-10-CM | POA: Diagnosis not present

## 2022-09-15 NOTE — Progress Notes (Signed)
Daily Session Note  Patient Details  Name: Joshua Barajas MRN: [id number] Date of Birth: [birth year] Referring Provider:   Flowsheet Row Cardiac Rehab from 08/21/2022 in [facility]  Referring Provider Isaias Cowman MD       Encounter Date: 09/15/2022  Check In:  Session Check In - 09/15/22 1127       Check-In   Supervising physician immediately available to respond to emergencies See telemetry face sheet for immediately available ER MD    Location [facility]-Cardiac & Pulmonary Rehab    Staff Present Antionette Fairy, BS, Exercise Physiologist;Kelly Amedeo Plenty, BS, ACSM CEP, Exercise Physiologist;Gaia Gullikson Tamala Julian, RN, ADN    Virtual Visit No    Medication changes reported     No    Fall or balance concerns reported    No    Warm-up and Cool-down Performed on first and last piece of equipment    Resistance Training Performed Yes    VAD Patient? No    PAD/SET Patient? No      Pain Assessment   Currently in Pain? No/denies                Social History   Tobacco Use  Smoking Status Former   Packs/day: 1.00   Years: 11.00   Total pack years: 11.00   Types: Cigarettes   Quit date: 04/10/1976   Years since quitting: 46.4  Smokeless Tobacco Never    Goals Met:  Independence with exercise equipment Exercise tolerated well No report of concerns or symptoms today Strength training completed today  Goals Unmet:  Not Applicable  Comments: Pt able to follow exercise prescription today without complaint.  Will continue to monitor for progression.    Dr. Emily Filbert is Medical Director for [facility].  Dr. Ottie Glazier is Medical Director for [facility].

## 2022-09-17 ENCOUNTER — Encounter: Payer: Self-pay | Admitting: *Deleted

## 2022-09-17 ENCOUNTER — Encounter: Payer: PPO | Admitting: *Deleted

## 2022-09-17 DIAGNOSIS — Z955 Presence of coronary angioplasty implant and graft: Secondary | ICD-10-CM

## 2022-09-17 NOTE — Progress Notes (Signed)
Cardiac Individual Treatment Plan  Patient Details  Name: Joshua Barajas MRN: [id number] Date of Birth: 03/06/1951 Referring Provider:   Flowsheet Row Cardiac Rehab from 08/21/2022 in [facility] Cardiac and Pulmonary Rehab  Referring Provider Joshua Cowman MD       Initial Encounter Date:  Flowsheet Row Cardiac Rehab from 08/21/2022 in [facility] Cardiac and Pulmonary Rehab  Date 08/21/22       Visit Diagnosis: Status post coronary artery stent placement  Patient's Home Medications on Admission:  Current Outpatient Medications:    apixaban (ELIQUIS) 5 MG TABS tablet, Take 1 tablet (5 mg total) by mouth 2 (two) times daily., Disp: 60 tablet, Rfl: 2   aspirin EC 81 MG tablet, Take 81 mg by mouth daily. Swallow whole. (Patient not taking: Reported on 08/13/2022), Disp: , Rfl:    atorvastatin (LIPITOR) 80 MG tablet, Take 1 tablet (80 mg total) by mouth every evening., Disp: 30 tablet, Rfl: 3   atorvastatin (LIPITOR) 80 MG tablet, Take by mouth. (Patient not taking: Reported on 08/13/2022), Disp: , Rfl:    cholecalciferol (VITAMIN D3) 25 MCG (1000 UNIT) tablet, Take 1,000 Units by mouth daily., Disp: , Rfl:    clopidogrel (PLAVIX) 75 MG tablet, Take 1 tablet (75 mg) by mouth daily., Disp: , Rfl:    cyanocobalamin (VITAMIN B12) 250 MCG tablet, Take 250 mcg by mouth 2 (two) times a week., Disp: , Rfl:    metoprolol tartrate (LOPRESSOR) 25 MG tablet, Take 1 tablet (25 mg total) by mouth 2 (two) times daily., Disp: 60 tablet, Rfl: 2   Multiple Vitamin (MULTIVITAMIN) tablet, Take 1 tablet by mouth daily., Disp: , Rfl:    ticagrelor (BRILINTA) 90 MG TABS tablet, Take 1 tablet (90 mg total) by mouth 2 (two) times daily. (Patient not taking: Reported on 08/13/2022), Disp: 60 tablet, Rfl: 3  Past Medical History: Past Medical History:  Diagnosis Date   Dysrhythmia    ED (erectile dysfunction)    Hematuria    HLD (hyperlipidemia)    Over weight    Paroxysmal atrial fibrillation (HCC)     Prostate cancer (HCC)    Rising PSA level    Sleep apnea    CPAP   Squamous cell carcinoma     Tobacco Use: Social History   Tobacco Use  Smoking Status Former   Packs/day: 1.00   Years: 11.00   Total pack years: 11.00   Types: Cigarettes   Quit date: 04/10/1976   Years since quitting: 46.4  Smokeless Tobacco Never    Labs: Review Flowsheet       Latest Ref Rng & Units 11/21/2016  Labs for ITP Cardiac and Pulmonary Rehab  Cholestrol 0 - 200 mg/dL 143   LDL (calc) 0 - 99 mg/dL 96   HDL-C >40 mg/dL 34   Trlycerides <150 mg/dL 66      Exercise Target Goals: Exercise Program Goal: Individual exercise prescription set using results from initial 6 min walk test and THRR while considering  patient's activity barriers and safety.   Exercise Prescription Goal: Initial exercise prescription builds to 30-45 minutes a day of aerobic activity, 2-3 days per week.  Home exercise guidelines will be given to patient during program as part of exercise prescription that the participant will acknowledge.   Education: Aerobic Exercise: - Group verbal and visual presentation on the components of exercise prescription. Introduces F.I.T.T principle from ACSM for exercise prescriptions.  Reviews F.I.T.T. principles of aerobic exercise including progression. Written material given at graduation. Flowsheet  Row Cardiac Rehab from 09/03/2022 in [facility] Cardiac and Pulmonary Rehab  Education need identified 08/21/22  Date 08/27/22  Educator [facility]  Instruction Review Code 1- Verbalizes Understanding       Education: Resistance Exercise: - Group verbal and visual presentation on the components of exercise prescription. Introduces F.I.T.T principle from ACSM for exercise prescriptions  Reviews F.I.T.T. principles of resistance exercise including progression. Written material given at graduation.    Education: Exercise & Equipment Safety: - Individual verbal instruction and demonstration of equipment  use and safety with use of the equipment. Flowsheet Row Cardiac Rehab from 09/03/2022 in [facility] Cardiac and Pulmonary Rehab  Date 08/13/22  Educator [facility]  Instruction Review Code 1- Verbalizes Understanding       Education: Exercise Physiology & General Exercise Guidelines: - Group verbal and written instruction with models to review the exercise physiology of the cardiovascular system and associated critical values. Provides general exercise guidelines with specific guidelines to those with heart or lung disease.    Education: Flexibility, Balance, Mind/Body Relaxation: - Group verbal and visual presentation with interactive activity on the components of exercise prescription. Introduces F.I.T.T principle from ACSM for exercise prescriptions. Reviews F.I.T.T. principles of flexibility and balance exercise training including progression. Also discusses the mind body connection.  Reviews various relaxation techniques to help reduce and manage stress (i.e. Deep breathing, progressive muscle relaxation, and visualization). Balance handout provided to take home. Written material given at graduation.   Activity Barriers & Risk Stratification:  Activity Barriers & Cardiac Risk Stratification - 08/21/22 1539       Activity Barriers & Cardiac Risk Stratification   Activity Barriers None    Cardiac Risk Stratification Moderate             6 Minute Walk:  6 Minute Walk     Row Name 08/21/22 1538         6 Minute Walk   Phase Initial     Distance 1355 feet     Walk Time 6 minutes     # of Rest Breaks 0     MPH 2.57     METS 3.02     RPE 11     Perceived Dyspnea  1     VO2 Peak 10.58     Symptoms Yes (comment)     Comments Hip Tightness     Resting HR 48 bpm     Resting BP 112/58     Resting Oxygen Saturation  100 %     Exercise Oxygen Saturation  during 6 min walk 94 %     Max Ex. HR 99 bpm     Max Ex. BP 126/68     2 Minute Post BP 114/58              Oxygen Initial  Assessment:   Oxygen Re-Evaluation:   Oxygen Discharge (Final Oxygen Re-Evaluation):   Initial Exercise Prescription:  Initial Exercise Prescription - 08/21/22 1600       Date of Initial Exercise RX and Referring Provider   Date 08/21/22    Referring Provider Joshua Cowman MD      Oxygen   Maintain Oxygen Saturation 88% or higher      Treadmill   MPH 1.8    Grade 0.5    Minutes 15    METs 2.57      Recumbant Bike   Level 2    RPM 50    Watts 32    Minutes 15  METs 2.57      NuStep   Level 2    SPM 80    Minutes 15    METs 2.57      REL-XR   Level 1    Speed 50    Minutes 15    METs 2.57      Biostep-RELP   Level 2    SPM 50    Minutes 15    METs 2.57      Track   Laps 35    Minutes 15    METs 2.57      Prescription Details   Frequency (times per week) 3    Duration Progress to 30 minutes of continuous aerobic without signs/symptoms of physical distress      Intensity   THRR 40-80% of Max Heartrate 89-129    Ratings of Perceived Exertion 11-13    Perceived Dyspnea 0-4      Progression   Progression Continue to progress workloads to maintain intensity without signs/symptoms of physical distress.      Resistance Training   Training Prescription Yes    Weight 5 lb    Reps 10-15             Perform Capillary Blood Glucose checks as needed.  Exercise Prescription Changes:   Exercise Prescription Changes     Row Name 08/21/22 1600 09/02/22 1600           Response to Exercise   Blood Pressure (Admit) 112/58 116/58      Blood Pressure (Exercise) 126/68 125/62      Blood Pressure (Exit) 114/58 96/62      Heart Rate (Admit) 48 bpm 53 bpm      Heart Rate (Exercise) 99 bpm 97 bpm      Heart Rate (Exit) 55 bpm 62 bpm      Oxygen Saturation (Admit) 100 % --      Oxygen Saturation (Exercise) 94 % --      Rating of Perceived Exertion (Exercise) 11 13      Perceived Dyspnea (Exercise) 1 --      Symptoms Hip Tightness none       Comments 6MWT Results 4th full day of exercise      Duration -- Progress to 30 minutes of  aerobic without signs/symptoms of physical distress      Intensity -- THRR unchanged        Progression   Progression -- Continue to progress workloads to maintain intensity without signs/symptoms of physical distress.      Average METs -- 2.75        Resistance Training   Training Prescription -- Yes      Weight -- 5 lb      Reps -- 10-15        Interval Training   Interval Training -- No        Treadmill   MPH -- 2.4      Grade -- 1      Minutes -- 15      METs -- 3.17        Recumbant Bike   Level -- 3      Watts -- 20      Minutes -- 15      METs -- 2.62        NuStep   Level -- 3      Minutes -- 15        Biostep-RELP   Level -- 2  Minutes -- 15      METs -- 3               Exercise Comments:   Exercise Comments     Row Name 08/25/22 1025           Exercise Comments First full day of exercise!  Patient was oriented to gym and equipment including functions, settings, policies, and procedures.  Patient's individual exercise prescription and treatment plan were reviewed.  All starting workloads were established based on the results of the 6 minute walk test done at initial orientation visit.  The plan for exercise progression was also introduced and progression will be customized based on patient's performance and goals.                Exercise Goals and Review:   Exercise Goals     Row Name 08/21/22 1539             Exercise Goals   Increase Physical Activity Yes       Intervention Provide advice, education, support and counseling about physical activity/exercise needs.;Develop an individualized exercise prescription for aerobic and resistive training based on initial evaluation findings, risk stratification, comorbidities and participant's personal goals.       Expected Outcomes Short Term: Attend rehab on a regular basis to increase amount of  physical activity.;Long Term: Add in home exercise to make exercise part of routine and to increase amount of physical activity.;Long Term: Exercising regularly at least 3-5 days a week.       Increase Strength and Stamina Yes       Intervention Provide advice, education, support and counseling about physical activity/exercise needs.;Develop an individualized exercise prescription for aerobic and resistive training based on initial evaluation findings, risk stratification, comorbidities and participant's personal goals.       Expected Outcomes Long Term: Improve cardiorespiratory fitness, muscular endurance and strength as measured by increased METs and functional capacity (6MWT);Short Term: Increase workloads from initial exercise prescription for resistance, speed, and METs.;Short Term: Perform resistance training exercises routinely during rehab and add in resistance training at home       Able to understand and use rate of perceived exertion (RPE) scale Yes       Intervention Provide education and explanation on how to use RPE scale       Expected Outcomes Short Term: Able to use RPE daily in rehab to express subjective intensity level;Long Term:  Able to use RPE to guide intensity level when exercising independently       Able to understand and use Dyspnea scale Yes       Intervention Provide education and explanation on how to use Dyspnea scale       Expected Outcomes Short Term: Able to use Dyspnea scale daily in rehab to express subjective sense of shortness of breath during exertion;Long Term: Able to use Dyspnea scale to guide intensity level when exercising independently       Knowledge and understanding of Target Heart Rate Range (THRR) Yes       Intervention Provide education and explanation of THRR including how the numbers were predicted and where they are located for reference       Expected Outcomes Short Term: Able to state/look up THRR;Long Term: Able to use THRR to govern intensity  when exercising independently;Short Term: Able to use daily as guideline for intensity in rehab       Able to check pulse independently Yes  Intervention Provide education and demonstration on how to check pulse in carotid and radial arteries.;Review the importance of being able to check your own pulse for safety during independent exercise       Expected Outcomes Short Term: Able to explain why pulse checking is important during independent exercise;Long Term: Able to check pulse independently and accurately       Understanding of Exercise Prescription Yes       Intervention Provide education, explanation, and written materials on patient's individual exercise prescription       Expected Outcomes Short Term: Able to explain program exercise prescription;Long Term: Able to explain home exercise prescription to exercise independently                Exercise Goals Re-Evaluation :  Exercise Goals Re-Evaluation     Row Name 08/25/22 1025 09/02/22 1625           Exercise Goal Re-Evaluation   Exercise Goals Review Able to understand and use rate of perceived exertion (RPE) scale;Able to understand and use Dyspnea scale;Knowledge and understanding of Target Heart Rate Range (THRR);Understanding of Exercise Prescription Increase Physical Activity;Increase Strength and Stamina;Understanding of Exercise Prescription      Comments Reviewed RPE scale, THR and program prescription with pt today.  Pt voiced understanding and was given a copy of goals to take home. Joshua Barajas is doing well for his first couple of sessions he has been at rehab. He already increased his workload on the treadmill to 2.4 mph/ 1% incline. He is also now working at level 3 on the recumbent bike results in 20 watts, hoping to see that go up overtime. We will continue to monitor as he progresses.      Expected Outcomes Short: Use RPE daily to regulate intensity. Long: Follow program prescription in THR. Short: Increase watts on  recumbent bike, follow exercise prescription Long: Build up overall strength and stamina               Discharge Exercise Prescription (Final Exercise Prescription Changes):  Exercise Prescription Changes - 09/02/22 1600       Response to Exercise   Blood Pressure (Admit) 116/58    Blood Pressure (Exercise) 125/62    Blood Pressure (Exit) 96/62    Heart Rate (Admit) 53 bpm    Heart Rate (Exercise) 97 bpm    Heart Rate (Exit) 62 bpm    Rating of Perceived Exertion (Exercise) 13    Symptoms none    Comments 4th full day of exercise    Duration Progress to 30 minutes of  aerobic without signs/symptoms of physical distress    Intensity THRR unchanged      Progression   Progression Continue to progress workloads to maintain intensity without signs/symptoms of physical distress.    Average METs 2.75      Resistance Training   Training Prescription Yes    Weight 5 lb    Reps 10-15      Interval Training   Interval Training No      Treadmill   MPH 2.4    Grade 1    Minutes 15    METs 3.17      Recumbant Bike   Level 3    Watts 20    Minutes 15    METs 2.62      NuStep   Level 3    Minutes 15      Biostep-RELP   Level 2    Minutes 15  METs 3             Nutrition:  Target Goals: Understanding of nutrition guidelines, daily intake of sodium '1500mg'$ , cholesterol '200mg'$ , calories 30% from fat and 7% or less from saturated fats, daily to have 5 or more servings of fruits and vegetables.  Education: All About Nutrition: -Group instruction provided by verbal, written material, interactive activities, discussions, models, and posters to present general guidelines for heart healthy nutrition including fat, fiber, MyPlate, the role of sodium in heart healthy nutrition, utilization of the nutrition label, and utilization of this knowledge for meal planning. Follow up email sent as well. Written material given at graduation. Flowsheet Row Cardiac Rehab from  09/03/2022 in [facility] Cardiac and Pulmonary Rehab  Date 09/03/22  Educator [facility]  Instruction Review Code 1- Verbalizes Understanding       Biometrics:  Pre Biometrics - 08/21/22 1540       Pre Biometrics   Height 6' 2.5" (1.892 m)    Weight 221 lb 1.6 oz (100.3 kg)    Waist Circumference 38 inches    Hip Circumference 43 inches    Waist to Hip Ratio 0.88 %    BMI (Calculated) 28.02    Single Leg Stand 16.1 seconds   R             Nutrition Therapy Plan and Nutrition Goals:  Nutrition Therapy & Goals - 08/21/22 1050       Nutrition Therapy   Diet Heart healthy, low Na    Drug/Food Interactions Statins/Certain Fruits    Protein (specify units) 80-90g    Fiber 30 grams    Whole Grain Foods 3 servings    Saturated Fats 16 max. grams    Fruits and Vegetables 8 servings/day    Sodium 2 grams      Personal Nutrition Goals   Nutrition Goal ST: soymilk instead of almond milk, vegetarian protein to help add protein to meatless meals LT: practice MyPlate guidelines, meet protein needs    Comments 71 y.o. M admitted to cardiac rehab s/p stent. PMHx includes A.fib, CAD, OSA, prostate cancer, HLD. Relevant medications includes lipitor, vit B12, MVI w/ minerals. B: he does not like to eat in am, he likes coffee (some cream and stevia) L: smoothies with fruit (banana and berries) and cottage cheese or greek yogurt as well as almond milk and ground flaxseed. D: big baked potato with stuff on it, sometimes daughter will give him food like roast beef which he will add some vegetables to, he likes eggs with whole wheat toast with sometimes some deli meat, minestrone soup, potato leek soup. he reports he will sometimes snack on some chips as well as nuts like walnuts. Drinks: water. Discussed general heart healthy eating and protein needs; recommended soymilk for smoothies instead of almond milk and vegetarian protein to meals without meat/chicken/fish such as greek yogurt, beans/lentils, eggs  (also encouraged foods that increase protein moderately such as whole grains).      Intervention Plan   Intervention Prescribe, educate and counsel regarding individualized specific dietary modifications aiming towards targeted core components such as weight, hypertension, lipid management, diabetes, heart failure and other comorbidities.;Nutrition handout(s) given to patient.    Expected Outcomes Short Term Goal: Understand basic principles of dietary content, such as calories, fat, sodium, cholesterol and nutrients.;Short Term Goal: A plan has been developed with personal nutrition goals set during dietitian appointment.;Long Term Goal: Adherence to prescribed nutrition plan.  Nutrition Assessments:  MEDIFICTS Score Key: ?70 Need to make dietary changes  40-70 Heart Healthy Diet ? 40 Therapeutic Level Cholesterol Diet  Flowsheet Row Cardiac Rehab from 08/21/2022 in [facility] Cardiac and Pulmonary Rehab  Picture Your Plate Total Score on Admission 70      Picture Your Plate Scores: <02 Unhealthy dietary pattern with much room for improvement. 41-50 Dietary pattern unlikely to meet recommendations for good health and room for improvement. 51-60 More healthful dietary pattern, with some room for improvement.  >60 Healthy dietary pattern, although there may be some specific behaviors that could be improved.    Nutrition Goals Re-Evaluation:   Nutrition Goals Discharge (Final Nutrition Goals Re-Evaluation):   Psychosocial: Target Goals: Acknowledge presence or absence of significant depression and/or stress, maximize coping skills, provide positive support system. Participant is able to verbalize types and ability to use techniques and skills needed for reducing stress and depression.   Education: Stress, Anxiety, and Depression - Group verbal and visual presentation to define topics covered.  Reviews how body is impacted by stress, anxiety, and depression.  Also discusses  healthy ways to reduce stress and to treat/manage anxiety and depression.  Written material given at graduation.   Education: Sleep Hygiene -Provides group verbal and written instruction about how sleep can affect your health.  Define sleep hygiene, discuss sleep cycles and impact of sleep habits. Review good sleep hygiene tips.    Initial Review & Psychosocial Screening:  Initial Psych Review & Screening - 08/13/22 1347       Initial Review   Current issues with None Identified      Family Dynamics   Good Support System? Yes    Comments He can look to his daughter for support. He lives alone with his daughter that lives close to him. Joshua Barajas has no dression or anxiety issues.      Barriers   Psychosocial barriers to participate in program The patient should benefit from training in stress management and relaxation.      Screening Interventions   Interventions Encouraged to exercise;To provide support and resources with identified psychosocial needs;Provide feedback about the scores to participant    Expected Outcomes Short Term goal: Utilizing psychosocial counselor, staff and physician to assist with identification of specific Stressors or current issues interfering with healing process. Setting desired goal for each stressor or current issue identified.;Long Term Goal: Stressors or current issues are controlled or eliminated.;Short Term goal: Identification and review with participant of any Quality of Life or Depression concerns found by scoring the questionnaire.;Long Term goal: The participant improves quality of Life and PHQ9 Scores as seen by post scores and/or verbalization of changes             Quality of Life Scores:   Quality of Life - 08/21/22 1515       Quality of Life   Select Quality of Life      Quality of Life Scores   Health/Function Pre 27.37 %    Socioeconomic Pre 30 %    Psych/Spiritual Pre 27.93 %    Family Pre 27.8 %    GLOBAL Pre 28.09 %             Scores of 19 and below usually indicate a poorer quality of life in these areas.  A difference of  2-3 points is a clinically meaningful difference.  A difference of 2-3 points in the total score of the Quality of Life Index has been associated with significant improvement  in overall quality of life, self-image, physical symptoms, and general health in studies assessing change in quality of life.  PHQ-9: Review Flowsheet       08/21/2022  Depression screen PHQ 2/9  Decreased Interest 0  Down, Depressed, Hopeless 0  PHQ - 2 Score 0  Altered sleeping 0  Tired, decreased energy 0  Change in appetite 0  Feeling bad or failure about yourself  0  Trouble concentrating 0  Moving slowly or fidgety/restless 0  Suicidal thoughts 0  PHQ-9 Score 0  Difficult doing work/chores Not difficult at all   Interpretation of Total Score  Total Score Depression Severity:  1-4 = Minimal depression, 5-9 = Mild depression, 10-14 = Moderate depression, 15-19 = Moderately severe depression, 20-27 = Severe depression   Psychosocial Evaluation and Intervention:  Psychosocial Evaluation - 08/13/22 1349       Psychosocial Evaluation & Interventions   Interventions Encouraged to exercise with the program and follow exercise prescription;Relaxation education;Stress management education    Comments He can look to his daughter for support. He lives alone with his daughter that lives close to him. Joshua Barajas has no dression or anxiety issues.    Expected Outcomes Short: Start HeartTrack to help with mood. Long: Maintain a healthy mental state    Continue Psychosocial Services  Follow up required by staff             Psychosocial Re-Evaluation:  Psychosocial Re-Evaluation     Joshua Barajas Name 09/03/22 1010             Psychosocial Re-Evaluation   Current issues with None Identified       Comments Joshua Barajas reports no stress concerns. He mentioned he sleeps with his CPAP and usually sleeps 8 hours. Today he  woke up still feeling tired and thinks it may be the change in weather or his metoprolol. He is happy coming here and can already tell a difference in his stamina. He wants to use his sessions for exercise and will decide which education topics he will attend.       Expected Outcomes Short: attend cardiac rehab for exercise and education. Long: maintain positive self care habits.       Continue Psychosocial Services  Follow up required by staff                Psychosocial Discharge (Final Psychosocial Re-Evaluation):  Psychosocial Re-Evaluation - 09/03/22 1010       Psychosocial Re-Evaluation   Current issues with None Identified    Comments Joshua Barajas reports no stress concerns. He mentioned he sleeps with his CPAP and usually sleeps 8 hours. Today he woke up still feeling tired and thinks it may be the change in weather or his metoprolol. He is happy coming here and can already tell a difference in his stamina. He wants to use his sessions for exercise and will decide which education topics he will attend.    Expected Outcomes Short: attend cardiac rehab for exercise and education. Long: maintain positive self care habits.    Continue Psychosocial Services  Follow up required by staff             Vocational Rehabilitation: Provide vocational rehab assistance to qualifying candidates.   Vocational Rehab Evaluation & Intervention:  Vocational Rehab - 09/03/22 1010       Initial Vocational Rehab Evaluation & Intervention   Assessment shows need for Vocational Rehabilitation No             Education: Education  Goals: Education classes will be provided on a variety of topics geared toward better understanding of heart health and risk factor modification. Participant will state understanding/return demonstration of topics presented as noted by education test scores.  Learning Barriers/Preferences:  Learning Barriers/Preferences - 08/13/22 1342       Learning  Barriers/Preferences   Learning Barriers None    Learning Preferences None             General Cardiac Education Topics:  AED/CPR: - Group verbal and written instruction with the use of models to demonstrate the basic use of the AED with the basic ABC's of resuscitation.   Anatomy and Cardiac Procedures: - Group verbal and visual presentation and models provide information about basic cardiac anatomy and function. Reviews the testing methods done to diagnose heart disease and the outcomes of the test results. Describes the treatment choices: Medical Management, Angioplasty, or Coronary Bypass Surgery for treating various heart conditions including Myocardial Infarction, Angina, Valve Disease, and Cardiac Arrhythmias.  Written material given at graduation. Flowsheet Row Cardiac Rehab from 09/03/2022 in [facility] Cardiac and Pulmonary Rehab  Education need identified 08/21/22       Medication Safety: - Group verbal and visual instruction to review commonly prescribed medications for heart and lung disease. Reviews the medication, class of the drug, and side effects. Includes the steps to properly store meds and maintain the prescription regimen.  Written material given at graduation.   Intimacy: - Group verbal instruction through game format to discuss how heart and lung disease can affect sexual intimacy. Written material given at graduation.. Flowsheet Row Cardiac Rehab from 09/03/2022 in [facility] Cardiac and Pulmonary Rehab  Date 08/27/22  Educator [facility]  Instruction Review Code 1- Verbalizes Understanding       Know Your Numbers and Heart Failure: - Group verbal and visual instruction to discuss disease risk factors for cardiac and pulmonary disease and treatment options.  Reviews associated critical values for Overweight/Obesity, Hypertension, Cholesterol, and Diabetes.  Discusses basics of heart failure: signs/symptoms and treatments.  Introduces Heart Failure Zone chart for action  plan for heart failure.  Written material given at graduation.   Infection Prevention: - Provides verbal and written material to individual with discussion of infection control including proper hand washing and proper equipment cleaning during exercise session. Flowsheet Row Cardiac Rehab from 09/03/2022 in [facility] Cardiac and Pulmonary Rehab  Date 08/13/22  Educator [facility]  Instruction Review Code 1- Verbalizes Understanding       Falls Prevention: - Provides verbal and written material to individual with discussion of falls prevention and safety. Flowsheet Row Cardiac Rehab from 09/03/2022 in [facility] Cardiac and Pulmonary Rehab  Date 08/13/22  Educator [facility]  Instruction Review Code 1- Verbalizes Understanding       Other: -Provides group and verbal instruction on various topics (see comments)   Knowledge Questionnaire Score:  Knowledge Questionnaire Score - 08/21/22 1516       Knowledge Questionnaire Score   Pre Score 23/26             Core Components/Risk Factors/Patient Goals at Admission:  Personal Goals and Risk Factors at Admission - 08/13/22 1342       Core Components/Risk Factors/Patient Goals on Admission    Weight Management Yes;Weight Loss    Intervention Weight Management: Develop a combined nutrition and exercise program designed to reach desired caloric intake, while maintaining appropriate intake of nutrient and fiber, sodium and fats, and appropriate energy expenditure required for the weight goal.;Weight Management: Provide  education and appropriate resources to help participant work on and attain dietary goals.;Weight Management/Obesity: Establish reasonable short term and long term weight goals.    Expected Outcomes Short Term: Continue to assess and modify interventions until short term weight is achieved;Long Term: Adherence to nutrition and physical activity/exercise program aimed toward attainment of established weight goal;Weight Loss: Understanding of  general recommendations for a balanced deficit meal plan, which promotes 1-2 lb weight loss per week and includes a negative energy balance of [phone] kcal/d;Understanding recommendations for meals to include 15-35% energy as protein, 25-35% energy from fat, 35-60% energy from carbohydrates, less than '200mg'$  of dietary cholesterol, 20-35 gm of total fiber daily;Understanding of distribution of calorie intake throughout the day with the consumption of 4-5 meals/snacks    Lipids Yes    Intervention Provide education and support for participant on nutrition & aerobic/resistive exercise along with prescribed medications to achieve LDL '70mg'$ , HDL >'40mg'$ .    Expected Outcomes Short Term: Participant states understanding of desired cholesterol values and is compliant with medications prescribed. Participant is following exercise prescription and nutrition guidelines.;Long Term: Cholesterol controlled with medications as prescribed, with individualized exercise RX and with personalized nutrition plan. Value goals: LDL < '70mg'$ , HDL > 40 mg.             Education:Diabetes - Individual verbal and written instruction to review signs/symptoms of diabetes, desired ranges of glucose level fasting, after meals and with exercise. Acknowledge that pre and post exercise glucose checks will be done for 3 sessions at entry of program.   Core Components/Risk Factors/Patient Goals Review:   Goals and Risk Factor Review     Row Name 09/03/22 1006             Core Components/Risk Factors/Patient Goals Review   Personal Goals Review Weight Management/Obesity;Lipids       Review Joshua Barajas has been enjoying coming to cardiac rehab. He has noticed that his legs are feeling stronger already. He is planning on attending all his exercise sessions to help with his weight management. He also is been slowly implementing the nutritional changes. He reports no issues with blood pressure. He is taking his medication as prescribed.        Expected Outcomes Short: attend cardiac rehab sessions to help with weight management. Long: indpendently manage risk factors                Core Components/Risk Factors/Patient Goals at Discharge (Final Review):   Goals and Risk Factor Review - 09/03/22 1006       Core Components/Risk Factors/Patient Goals Review   Personal Goals Review Weight Management/Obesity;Lipids    Review Joshua Barajas has been enjoying coming to cardiac rehab. He has noticed that his legs are feeling stronger already. He is planning on attending all his exercise sessions to help with his weight management. He also is been slowly implementing the nutritional changes. He reports no issues with blood pressure. He is taking his medication as prescribed.    Expected Outcomes Short: attend cardiac rehab sessions to help with weight management. Long: indpendently manage risk factors             ITP Comments:  ITP Comments     Row Name 08/13/22 1341 08/21/22 1537 08/25/22 1025 09/17/22 0840     ITP Comments Virtual Visit completed. Patient informed on EP and RD appointment and 6 Minute walk test. Patient also informed of patient health questionnaires on My Chart. Patient Verbalizes understanding. Visit diagnosis can be found in  [facility] 07/31/22. Completed 6MWT and gym orientation. Initial ITP created and sent for review to Dr. Emily Filbert, Medical Director. First full day of exercise!  Patient was oriented to gym and equipment including functions, settings, policies, and procedures.  Patient's individual exercise prescription and treatment plan were reviewed.  All starting workloads were established based on the results of the 6 minute walk test done at initial orientation visit.  The plan for exercise progression was also introduced and progression will be customized based on patient's performance and goals. 30 Day review completed. Medical Director ITP review done, changes made as directed, and signed approval by Medical  Director.   new to program             Comments:

## 2022-09-17 NOTE — Progress Notes (Signed)
Daily Session Note  Patient Details  Name: Joshua Barajas MRN: [id number] Date of Birth: 05/01/1951 Referring Provider:   Flowsheet Row Cardiac Rehab from 08/21/2022 in [facility] Cardiac and Pulmonary Rehab  Referring Provider Alexander Paraschos MD       Encounter Date: 09/17/2022  Check In:  Session Check In - 09/17/22 1112       Check-In   Supervising physician immediately available to respond to emergencies See telemetry face sheet for immediately available ER MD    Location [facility]-Cardiac & Pulmonary Rehab    Staff Present Meredith Craven, RN BSN;Jessica Hawkins, MA, RCEP, CCRP, CCET;Megan Smith, RN, ADN;Noah Tickle, BS, Exercise Physiologist    Virtual Visit No    Medication changes reported     No    Fall or balance concerns reported    No    Warm-up and Cool-down Performed on first and last piece of equipment    Resistance Training Performed Yes    VAD Patient? No    PAD/SET Patient? No      Pain Assessment   Currently in Pain? No/denies                Social History   Tobacco Use  Smoking Status Former   Packs/day: 1.00   Years: 11.00   Total pack years: 11.00   Types: Cigarettes   Quit date: 04/10/1976   Years since quitting: 46.4  Smokeless Tobacco Never    Goals Met:  Independence with exercise equipment Exercise tolerated well No report of concerns or symptoms today Strength training completed today  Goals Unmet:  Not Applicable  Comments: Pt able to follow exercise prescription today without complaint.  Will continue to monitor for progression.    Dr. Mark Miller is Medical Director for [facility].  Dr. Fuad Aleskerov is Medical Director for [facility]. 

## 2022-09-19 ENCOUNTER — Encounter: Payer: PPO | Attending: Cardiology | Admitting: *Deleted

## 2022-09-19 DIAGNOSIS — Z955 Presence of coronary angioplasty implant and graft: Secondary | ICD-10-CM | POA: Insufficient documentation

## 2022-09-19 DIAGNOSIS — Z48812 Encounter for surgical aftercare following surgery on the circulatory system: Secondary | ICD-10-CM | POA: Insufficient documentation

## 2022-09-19 NOTE — Progress Notes (Signed)
Daily Session Note  Patient Details  Name: Joshua Barajas MRN: [id number] Date of Birth: Mar 16, 1951 Referring Provider:   Flowsheet Row Cardiac Rehab from 08/21/2022 in [facility] Cardiac and Pulmonary Rehab  Referring Provider Isaias Cowman MD       Encounter Date: 09/19/2022  Check In:  Session Check In - 09/19/22 0939       Check-In   Staff Present Alberteen Sam, MA, RCEP, CCRP, CCET;Marvell Fuller, PhD, RN, CNS, CEN;Joseph Tessie Fass, [place]    Virtual Visit No    Medication changes reported     No    Fall or balance concerns reported    No    Warm-up and Cool-down Performed on first and last piece of equipment    Resistance Training Performed Yes    VAD Patient? No    PAD/SET Patient? No      Pain Assessment   Currently in Pain? No/denies                Social History   Tobacco Use  Smoking Status Former   Packs/day: 1.00   Years: 11.00   Total pack years: 11.00   Types: Cigarettes   Quit date: 04/10/1976   Years since quitting: 46.4  Smokeless Tobacco Never    Goals Met:  Independence with exercise equipment Exercise tolerated well No report of concerns or symptoms today Strength training completed today  Goals Unmet:  Not Applicable  Comments: Pt able to follow exercise prescription today without complaint.  Will continue to monitor for progression.    Dr. Emily Filbert is Medical Director for [place].  Dr. Ottie Glazier is Medical Director for [facility] Pulmonary Rehabilitation.

## 2022-09-22 ENCOUNTER — Encounter: Payer: PPO | Admitting: *Deleted

## 2022-09-22 DIAGNOSIS — Z955 Presence of coronary angioplasty implant and graft: Secondary | ICD-10-CM

## 2022-09-22 NOTE — Progress Notes (Signed)
Daily Session Note  Patient Details  Name: Joshua Barajas MRN: [id number] Date of Birth: 07/25/1951 Referring Provider:   Flowsheet Row Cardiac Rehab from 08/21/2022 in [facility] Cardiac and Pulmonary Rehab  Referring Provider Isaias Cowman MD       Encounter Date: 09/22/2022  Check In:  Session Check In - 09/22/22 1004       Check-In   Supervising physician immediately available to respond to emergencies See telemetry face sheet for immediately available ER MD    Location [facility]-Cardiac & Pulmonary Rehab    Staff Present Darlyne Russian, RN, Dimple Nanas, BS, Exercise Physiologist;Kelly Amedeo Plenty, BS, ACSM CEP, Exercise Physiologist    Virtual Visit No    Medication changes reported     No    Fall or balance concerns reported    No    Warm-up and Cool-down Performed on first and last piece of equipment    Resistance Training Performed Yes    VAD Patient? No    PAD/SET Patient? No      Pain Assessment   Currently in Pain? No/denies                Social History   Tobacco Use  Smoking Status Former   Packs/day: 1.00   Years: 11.00   Total pack years: 11.00   Types: Cigarettes   Quit date: 04/10/1976   Years since quitting: 46.4  Smokeless Tobacco Never    Goals Met:  Independence with exercise equipment Exercise tolerated well No report of concerns or symptoms today Strength training completed today  Goals Unmet:  Not Applicable  Comments: Pt able to follow exercise prescription today without complaint.  Will continue to monitor for progression.    Dr. Emily Filbert is Medical Director for [place].  Dr. Ottie Glazier is Medical Director for [facility] Pulmonary Rehabilitation.

## 2022-09-24 ENCOUNTER — Encounter: Payer: PPO | Admitting: *Deleted

## 2022-09-24 DIAGNOSIS — Z955 Presence of coronary angioplasty implant and graft: Secondary | ICD-10-CM

## 2022-09-24 NOTE — Progress Notes (Signed)
Daily Session Note  Patient Details  Name: Joshua Barajas MRN: [id number] Date of Birth: [birth year] Referring Provider:   Flowsheet Row Cardiac Rehab from 08/21/2022 in [facility] Cardiac and Pulmonary Rehab  Referring Provider Isaias Cowman MD       Encounter Date: 09/24/2022  Check In:  Session Check In - 09/24/22 1238       Check-In   Supervising physician immediately available to respond to emergencies See telemetry face sheet for immediately available ER MD    Location [facility]-Cardiac & Pulmonary Rehab    Staff Present Nyoka Cowden, RN, BSN, Lauretta Grill, RCP,RRT,BSRT;Noah Tickle, BS, Exercise Physiologist;Laureen Owens [place], [place], RRT, CPFT;Meredith Sherryll Burger, RN BSN    Virtual Visit No    Medication changes reported     No    Fall or balance concerns reported    No    Tobacco Cessation No Change    Warm-up and Cool-down Performed on first and last piece of equipment    Resistance Training Performed Yes    VAD Patient? No    PAD/SET Patient? No      Pain Assessment   Currently in Pain? No/denies                Social History   Tobacco Use  Smoking Status Former   Packs/day: 1.00   Years: 11.00   Total pack years: 11.00   Types: Cigarettes   Quit date: 04/10/1976   Years since quitting: 46.4  Smokeless Tobacco Never    Goals Met:  Independence with exercise equipment Exercise tolerated well No report of concerns or symptoms today  Goals Unmet:  Not Applicable  Comments: Pt able to follow exercise prescription today without complaint.  Will continue to monitor for progression.    Dr. Emily Filbert is Medical Director for [place].  Dr. Ottie Glazier is Medical Director for [facility] Pulmonary Rehabilitation.

## 2022-09-25 DIAGNOSIS — L820 Inflamed seborrheic keratosis: Secondary | ICD-10-CM | POA: Diagnosis not present

## 2022-09-25 DIAGNOSIS — X32XXXA Exposure to sunlight, initial encounter: Secondary | ICD-10-CM | POA: Diagnosis not present

## 2022-09-25 DIAGNOSIS — D225 Melanocytic nevi of trunk: Secondary | ICD-10-CM | POA: Diagnosis not present

## 2022-09-25 DIAGNOSIS — L570 Actinic keratosis: Secondary | ICD-10-CM | POA: Diagnosis not present

## 2022-09-25 DIAGNOSIS — D2262 Melanocytic nevi of left upper limb, including shoulder: Secondary | ICD-10-CM | POA: Diagnosis not present

## 2022-09-25 DIAGNOSIS — D2261 Melanocytic nevi of right upper limb, including shoulder: Secondary | ICD-10-CM | POA: Diagnosis not present

## 2022-09-25 DIAGNOSIS — D485 Neoplasm of uncertain behavior of skin: Secondary | ICD-10-CM | POA: Diagnosis not present

## 2022-09-25 DIAGNOSIS — C44222 Squamous cell carcinoma of skin of right ear and external auricular canal: Secondary | ICD-10-CM | POA: Diagnosis not present

## 2022-09-25 DIAGNOSIS — Z85828 Personal history of other malignant neoplasm of skin: Secondary | ICD-10-CM | POA: Diagnosis not present

## 2022-09-26 ENCOUNTER — Encounter: Payer: PPO | Admitting: *Deleted

## 2022-09-26 DIAGNOSIS — Z955 Presence of coronary angioplasty implant and graft: Secondary | ICD-10-CM

## 2022-09-26 NOTE — Progress Notes (Signed)
Daily Session Note  Patient Details  Name: Joshua Barajas MRN: [id number] Date of Birth: Oct 18, 1951 Referring Provider:   Flowsheet Row Cardiac Rehab from 08/21/2022 in [facility] Cardiac and Pulmonary Rehab  Referring Provider Isaias Cowman MD       Encounter Date: 09/26/2022  Check In:  Session Check In - 09/26/22 1039       Check-In   Supervising physician immediately available to respond to emergencies See telemetry face sheet for immediately available ER MD    Location [facility]Cardiac [facility] Pulmonary Rehab    Staff Present Heath Lark, RN, BSN, CCRP;Jessica [place], MA, RCEP, CCRP, CCET;Joseph [place], [place]    Virtual Visit No    Medication changes reported     No    Fall or balance concerns reported    No    Warm-up and Cool-down Performed on first and last piece of equipment    Resistance Training Performed Yes    VAD Patient? No    PAD/SET Patient? No      Pain Assessment   Currently in Pain? No/denies                Social History   Tobacco Use  Smoking Status Former   Packs/day: 1.00   Years: 11.00   Total pack years: 11.00   Types: Cigarettes   Quit date: 04/10/1976   Years since quitting: 46.4  Smokeless Tobacco Never    Goals Met:  Independence with exercise equipment Exercise tolerated well No report of concerns or symptoms today  Goals Unmet:  Not Applicable  Comments: Pt able to follow exercise prescription today without complaint.  Will continue to monitor for progression.    Dr. Emily Filbert is Medical Director for [place].  Dr. Ottie Glazier is Medical Director for [facility] Pulmonary Rehabilitation.

## 2022-09-29 ENCOUNTER — Encounter: Payer: PPO | Admitting: *Deleted

## 2022-09-29 DIAGNOSIS — Z955 Presence of coronary angioplasty implant and graft: Secondary | ICD-10-CM

## 2022-09-29 NOTE — Progress Notes (Signed)
Daily Session Note  Patient Details  Name: Joshua Barajas MRN: [id number] Date of Birth: February 08, 1951 Referring Provider:   Flowsheet Row Cardiac Rehab from 08/21/2022 in [facility] Cardiac and Pulmonary Rehab  Referring Provider Isaias Cowman MD       Encounter Date: 09/29/2022  Check In:  Session Check In - 09/29/22 1042       Check-In   Supervising physician immediately available to respond to emergencies See telemetry face sheet for immediately available ER MD    Location [facility]-Cardiac & Pulmonary Rehab    Staff Present Antionette Fairy, BS, Exercise Physiologist;Kelly Amedeo Plenty, BS, ACSM CEP, Exercise Physiologist;Ethan Clayburn Tamala Julian, RN, ADN    Virtual Visit No    Medication changes reported     No    Fall or balance concerns reported    No    Warm-up and Cool-down Performed on first and last piece of equipment    Resistance Training Performed Yes    VAD Patient? No    PAD/SET Patient? No      Pain Assessment   Currently in Pain? No/denies                Social History   Tobacco Use  Smoking Status Former   Packs/day: 1.00   Years: 11.00   Total pack years: 11.00   Types: Cigarettes   Quit date: 04/10/1976   Years since quitting: 46.5  Smokeless Tobacco Never    Goals Met:  Independence with exercise equipment Exercise tolerated well No report of concerns or symptoms today Strength training completed today  Goals Unmet:  Not Applicable  Comments: Pt able to follow exercise prescription today without complaint.  Will continue to monitor for progression.    Dr. Emily Filbert is Medical Director for [place].  Dr. Ottie Glazier is Medical Director for [facility] Pulmonary Rehabilitation.

## 2022-10-01 ENCOUNTER — Encounter: Payer: PPO | Admitting: *Deleted

## 2022-10-01 DIAGNOSIS — Z955 Presence of coronary angioplasty implant and graft: Secondary | ICD-10-CM

## 2022-10-01 DIAGNOSIS — C44222 Squamous cell carcinoma of skin of right ear and external auricular canal: Secondary | ICD-10-CM | POA: Diagnosis not present

## 2022-10-01 DIAGNOSIS — D0421 Carcinoma in situ of skin of right ear and external auricular canal: Secondary | ICD-10-CM | POA: Diagnosis not present

## 2022-10-01 NOTE — Progress Notes (Signed)
Daily Session Note  Patient Details  Name: LELDON STEEGE MRN: [id number] Date of Birth: 29-Jul-1951 Referring Provider:   Flowsheet Row Cardiac Rehab from 08/21/2022 in [facility] Cardiac and Pulmonary Rehab  Referring Provider Isaias Cowman MD       Encounter Date: 10/01/2022  Check In:  Session Check In - 10/01/22 1117       Check-In   Supervising physician immediately available to respond to emergencies See telemetry face sheet for immediately available ER MD    Location [facility]-Cardiac & Pulmonary Rehab    Staff Present Darlyne Russian, RN, Dimple Nanas, BS, Exercise Physiologist;Meredith Sherryll Burger, RN BSN    Virtual Visit No    Medication changes reported     No    Fall or balance concerns reported    No    Warm-up and Cool-down Performed on first and last piece of equipment    Resistance Training Performed Yes    VAD Patient? No    PAD/SET Patient? No      Pain Assessment   Currently in Pain? No/denies                Social History   Tobacco Use  Smoking Status Former   Packs/day: 1.00   Years: 11.00   Total pack years: 11.00   Types: Cigarettes   Quit date: 04/10/1976   Years since quitting: 46.5  Smokeless Tobacco Never    Goals Met:  Independence with exercise equipment Exercise tolerated well No report of concerns or symptoms today Strength training completed today  Goals Unmet:  Not Applicable  Comments: Pt able to follow exercise prescription today without complaint.  Will continue to monitor for progression.    Dr. Emily Filbert is Medical Director for [place].  Dr. Ottie Glazier is Medical Director for [facility] Pulmonary Rehabilitation.

## 2022-10-06 ENCOUNTER — Encounter: Payer: PPO | Admitting: *Deleted

## 2022-10-06 DIAGNOSIS — Z955 Presence of coronary angioplasty implant and graft: Secondary | ICD-10-CM | POA: Diagnosis not present

## 2022-10-06 NOTE — Progress Notes (Signed)
Daily Session Note  Patient Details  Name: Joshua Barajas MRN: [id number] Date of Birth: September 25, 1951 Referring Provider:   Flowsheet Row Cardiac Rehab from 08/21/2022 in [facility] Cardiac and Pulmonary Rehab  Referring Provider Isaias Cowman MD       Encounter Date: 10/06/2022  Check In:  Session Check In - 10/06/22 1001       Check-In   Supervising physician immediately available to respond to emergencies See telemetry face sheet for immediately available ER MD    Location [facility]-Cardiac & Pulmonary Rehab    Staff Present Earlean Shawl, BS, ACSM CEP, Exercise Physiologist;Karsten Vaughn Tamala Julian, RN, Terie Purser, RCP,RRT,BSRT    Virtual Visit No    Medication changes reported     No    Fall or balance concerns reported    No    Warm-up and Cool-down Performed on first and last piece of equipment    Resistance Training Performed Yes    VAD Patient? No    PAD/SET Patient? No      Pain Assessment   Currently in Pain? No/denies                Social History   Tobacco Use  Smoking Status Former   Packs/day: 1.00   Years: 11.00   Total pack years: 11.00   Types: Cigarettes   Quit date: 04/10/1976   Years since quitting: 46.5  Smokeless Tobacco Never    Goals Met:  Independence with exercise equipment Exercise tolerated well No report of concerns or symptoms today Strength training completed today  Goals Unmet:  Not Applicable  Comments: Pt able to follow exercise prescription today without complaint.  Will continue to monitor for progression.    Dr. Emily Filbert is Medical Director for [place].  Dr. Ottie Glazier is Medical Director for [facility] Pulmonary Rehabilitation.

## 2022-10-08 ENCOUNTER — Encounter: Payer: PPO | Admitting: *Deleted

## 2022-10-08 DIAGNOSIS — Z955 Presence of coronary angioplasty implant and graft: Secondary | ICD-10-CM

## 2022-10-08 NOTE — Progress Notes (Signed)
Daily Session Note  Patient Details  Name: Joshua Barajas MRN: [id number] Date of Birth: 1951/01/18 Referring Provider:   Flowsheet Row Cardiac Rehab from 08/21/2022 in [facility] Cardiac and Pulmonary Rehab  Referring Provider Isaias Cowman MD       Encounter Date: 10/08/2022  Check In:  Session Check In - 10/08/22 1005       Check-In   Supervising physician immediately available to respond to emergencies See telemetry face sheet for immediately available ER MD    Location [facility]-Cardiac & Pulmonary Rehab    Staff Present Renita Papa, RN BSN;Joseph Tessie Fass, RCP,RRT,BSRT;Noah [place], [place], Exercise Physiologist    Virtual Visit No    Medication changes reported     No    Fall or balance concerns reported    No    Warm-up and Cool-down Performed on first and last piece of equipment    Resistance Training Performed Yes    VAD Patient? No    PAD/SET Patient? No      Pain Assessment   Currently in Pain? No/denies                Social History   Tobacco Use  Smoking Status Former   Packs/day: 1.00   Years: 11.00   Total pack years: 11.00   Types: Cigarettes   Quit date: 04/10/1976   Years since quitting: 46.5  Smokeless Tobacco Never    Goals Met:  Independence with exercise equipment Exercise tolerated well No report of concerns or symptoms today Strength training completed today  Goals Unmet:  Not Applicable  Comments: Pt able to follow exercise prescription today without complaint.  Will continue to monitor for progression.    Dr. Emily Filbert is Medical Director for [place].  Dr. Ottie Glazier is Medical Director for [facility] Pulmonary Rehabilitation.

## 2022-10-10 ENCOUNTER — Encounter: Payer: PPO | Admitting: *Deleted

## 2022-10-10 DIAGNOSIS — Z955 Presence of coronary angioplasty implant and graft: Secondary | ICD-10-CM

## 2022-10-10 NOTE — Progress Notes (Signed)
Daily Session Note  Patient Details  Name: Joshua Barajas MRN: [id number] Date of Birth: 07/27/1951 Referring Provider:   Flowsheet Row Cardiac Rehab from 08/21/2022 in [facility] Cardiac [facility] Pul[facility]  Referring Provider Isaias Cowman MD       Encounter Date: 10/10/2022  Check In:  Session Check In - 10/10/22 1010       Check-In   Supervising physician immediately available to respond to emergencies See telemetry face sheet for immediately available ER MD    Location [facility]-Cardiac & Pulmonary Rehab    Staff Present Heath Lark, RN, BSN, CCRP;Jessica Pana, MA, RCEP, CCRP, CCET;Joseph Pierron, [place]    Virtual Visit No    Medication changes reported     No    Fall or balance concerns reported    No    Warm-up and Cool-down Performed on first and last piece of equipment    Resistance Training Performed Yes    VAD Patient? No    PAD/SET Patient? No      Pain Assessment   Currently in Pain? No/denies                Social History   Tobacco Use  Smoking Status Former   Packs/day: 1.00   Years: 11.00   Total pack years: 11.00   Types: Cigarettes   Quit date: 04/10/1976   Years since quitting: 46.5  Smokeless Tobacco Never    Goals Met:  Independence with exercise equipment Exercise tolerated well No report of concerns or symptoms today  Goals Unmet:  Not Applicable  Comments: Pt able to follow exercise prescription today without complaint.  Will continue to monitor for progression.    Dr. Emily Filbert is Medical Director for [place].  Dr. Ottie Glazier is Medical Director for [facility] Pulmonary Rehabilitation.

## 2022-10-15 ENCOUNTER — Encounter: Payer: PPO | Admitting: *Deleted

## 2022-10-15 ENCOUNTER — Encounter: Payer: Self-pay | Admitting: *Deleted

## 2022-10-15 DIAGNOSIS — Z955 Presence of coronary angioplasty implant and graft: Secondary | ICD-10-CM

## 2022-10-15 NOTE — Progress Notes (Signed)
Daily Session Note  Patient Details  Name: Joshua Barajas MRN: [id number] Date of Birth: May 04, 1951 Referring Provider:   Flowsheet Row Cardiac Rehab from 08/21/2022 in [facility] Cardiac and Pulmonary Rehab  Referring Provider Isaias Cowman MD       Encounter Date: 10/15/2022  Check In:  Session Check In - 10/15/22 0959       Check-In   Supervising physician immediately available to respond to emergencies See telemetry face sheet for immediately available ER MD    Location [facility]-Cardiac & Pulmonary Rehab    Staff Present Darlyne Russian, RN, ADN;Jessica Luan Pulling, MA, RCEP, CCRP, CCET;Noah Tickle, BS, Exercise Physiologist    Virtual Visit No    Medication changes reported     No    Fall or balance concerns reported    No    Tobacco Cessation Use Increase    Warm-up and Cool-down Performed on first and last piece of equipment    Resistance Training Performed Yes    VAD Patient? No    PAD/SET Patient? No      Pain Assessment   Currently in Pain? No/denies                Social History   Tobacco Use  Smoking Status Former   Packs/day: 1.00   Years: 11.00   Total pack years: 11.00   Types: Cigarettes   Quit date: 04/10/1976   Years since quitting: 46.5  Smokeless Tobacco Never    Goals Met:  Independence with exercise equipment Exercise tolerated well No report of concerns or symptoms today Strength training completed today  Goals Unmet:  Not Applicable  Comments: Pt able to follow exercise prescription today without complaint.  Will continue to monitor for progression.    Dr. Emily Filbert is Medical Director for [place].  Dr. Ottie Glazier is Medical Director for [facility] Pulmonary Rehabilitation.

## 2022-10-15 NOTE — Progress Notes (Signed)
Cardiac Individual Treatment Plan  Patient Details  Name: Joshua Barajas MRN: [id number] Date of Birth: [name] Referring Provider:   Flowsheet Row Cardiac Rehab from 08/21/2022 in [facility] Cardiac and Pulmonary Rehab  Referring Provider Joshua Cowman MD       Initial Encounter Date:  Flowsheet Row Cardiac Rehab from 08/21/2022 in [facility] Cardiac and Pulmonary Rehab  Date 08/21/22       Visit Diagnosis: Status post coronary artery stent placement  Patient's Home Medications on Admission:  Current Outpatient Medications:    apixaban (ELIQUIS) 5 MG TABS tablet, Take 1 tablet (5 mg total) by mouth 2 (two) times daily., Disp: 60 tablet, Rfl: 2   aspirin EC 81 MG tablet, Take 81 mg by mouth daily. Swallow whole. (Patient not taking: Reported on 08/13/2022), Disp: , Rfl:    atorvastatin (LIPITOR) 80 MG tablet, Take 1 tablet (80 mg total) by mouth every evening., Disp: 30 tablet, Rfl: 3   atorvastatin (LIPITOR) 80 MG tablet, Take by mouth. (Patient not taking: Reported on 08/13/2022), Disp: , Rfl:    cholecalciferol (VITAMIN D3) 25 MCG (1000 UNIT) tablet, Take 1,000 Units by mouth daily., Disp: , Rfl:    clopidogrel (PLAVIX) 75 MG tablet, Take 1 tablet (75 mg) by mouth daily., Disp: , Rfl:    cyanocobalamin (VITAMIN B12) 250 MCG tablet, Take 250 mcg by mouth 2 (two) times a week., Disp: , Rfl:    metoprolol tartrate (LOPRESSOR) 25 MG tablet, Take 1 tablet (25 mg total) by mouth 2 (two) times daily., Disp: 60 tablet, Rfl: 2   Multiple Vitamin (MULTIVITAMIN) tablet, Take 1 tablet by mouth daily., Disp: , Rfl:    ticagrelor (BRILINTA) 90 MG TABS tablet, Take 1 tablet (90 mg total) by mouth 2 (two) times daily. (Patient not taking: Reported on 08/13/2022), Disp: 60 tablet, Rfl: 3  Past Medical History: Past Medical History:  Diagnosis Date   Dysrhythmia    ED (erectile dysfunction)    Hematuria    HLD (hyperlipidemia)    Over weight    Paroxysmal atrial fibrillation (HCC)     Prostate cancer (HCC)    Rising PSA level    Sleep apnea    CPAP   Squamous cell carcinoma     Tobacco Use: Social History   Tobacco Use  Smoking Status Former   Packs/day: 1.00   Years: 11.00   Total pack years: 11.00   Types: Cigarettes   Quit date: 04/10/1976   Years since quitting: 46.5  Smokeless Tobacco Never    Labs: Review Flowsheet       Latest Ref Rng & Units 11/21/2016  Labs for ITP Cardiac and Pulmonary Rehab  Cholestrol 0 - 200 mg/dL 143   LDL (calc) 0 - 99 mg/dL 96   HDL-C >40 mg/dL 34   Trlycerides <150 mg/dL 66      Exercise Target Goals: Exercise Program Goal: Individual exercise prescription set using results from initial 6 min walk test and THRR while considering  patient's activity barriers and safety.   Exercise Prescription Goal: Initial exercise prescription builds to 30-45 minutes a day of aerobic activity, 2-3 days per week.  Home exercise guidelines will be given to patient during program as part of exercise prescription that the participant will acknowledge.   Education: Aerobic Exercise: - Group verbal and visual presentation on the components of exercise prescription. Introduces F.I.T.T principle from ACSM for exercise prescriptions.  Reviews F.I.T.T. principles of aerobic exercise including progression. Written material given at graduation. Flowsheet  Row Cardiac Rehab from 09/03/2022 in [facility] Cardiac and Pulmonary Rehab  Education need identified 08/21/22  Date 08/27/22  Educator [facility]  Instruction Review Code 1- Verbalizes Understanding       Education: Resistance Exercise: - Group verbal and visual presentation on the components of exercise prescription. Introduces F.I.T.T principle from ACSM for exercise prescriptions  Reviews F.I.T.T. principles of resistance exercise including progression. Written material given at graduation.    Education: Exercise & Equipment Safety: - Individual verbal instruction and demonstration of equipment  use and safety with use of the equipment. Flowsheet Row Cardiac Rehab from 09/03/2022 in [facility] Cardiac and Pulmonary Rehab  Date 08/13/22  Educator [facility]  Instruction Review Code 1- Verbalizes Understanding       Education: Exercise Physiology & General Exercise Guidelines: - Group verbal and written instruction with models to review the exercise physiology of the cardiovascular system and associated critical values. Provides general exercise guidelines with specific guidelines to those with heart or lung disease.    Education: Flexibility, Balance, Mind/Body Relaxation: - Group verbal and visual presentation with interactive activity on the components of exercise prescription. Introduces F.I.T.T principle from ACSM for exercise prescriptions. Reviews F.I.T.T. principles of flexibility and balance exercise training including progression. Also discusses the mind body connection.  Reviews various relaxation techniques to help reduce and manage stress (i.e. Deep breathing, progressive muscle relaxation, and visualization). Balance handout provided to take home. Written material given at graduation.   Activity Barriers & Risk Stratification:  Activity Barriers & Cardiac Risk Stratification - 08/21/22 1539       Activity Barriers & Cardiac Risk Stratification   Activity Barriers None    Cardiac Risk Stratification Moderate             6 Minute Walk:  6 Minute Walk     Row Name 08/21/22 1538         6 Minute Walk   Phase Initial     Distance 1355 feet     Walk Time 6 minutes     # of Rest Breaks 0     MPH 2.57     METS 3.02     RPE 11     Perceived Dyspnea  1     VO2 Peak 10.58     Symptoms Yes (comment)     Comments Hip Tightness     Resting HR 48 bpm     Resting BP 112/58     Resting Oxygen Saturation  100 %     Exercise Oxygen Saturation  during 6 min walk 94 %     Max Ex. HR 99 bpm     Max Ex. BP 126/68     2 Minute Post BP 114/58              Oxygen Initial  Assessment:   Oxygen Re-Evaluation:   Oxygen Discharge (Final Oxygen Re-Evaluation):   Initial Exercise Prescription:  Initial Exercise Prescription - 08/21/22 1600       Date of Initial Exercise RX and Referring Provider   Date 08/21/22    Referring Provider Joshua Cowman MD      Oxygen   Maintain Oxygen Saturation 88% or higher      Treadmill   MPH 1.8    Grade 0.5    Minutes 15    METs 2.57      Recumbant Bike   Level 2    RPM 50    Watts 32    Minutes 15  METs 2.57      NuStep   Level 2    SPM 80    Minutes 15    METs 2.57      REL-XR   Level 1    Speed 50    Minutes 15    METs 2.57      Biostep-RELP   Level 2    SPM 50    Minutes 15    METs 2.57      Track   Laps 35    Minutes 15    METs 2.57      Prescription Details   Frequency (times per week) 3    Duration Progress to 30 minutes of continuous aerobic without signs/symptoms of physical distress      Intensity   THRR 40-80% of Max Heartrate 89-129    Ratings of Perceived Exertion 11-13    Perceived Dyspnea 0-4      Progression   Progression Continue to progress workloads to maintain intensity without signs/symptoms of physical distress.      Resistance Training   Training Prescription Yes    Weight 5 lb    Reps 10-15             Perform Capillary Blood Glucose checks as needed.  Exercise Prescription Changes:   Exercise Prescription Changes     Row Name 08/21/22 1600 09/02/22 1600 09/17/22 1600 10/01/22 1200       Response to Exercise   Blood Pressure (Admit) 112/58 116/58 118/64 98/56    Blood Pressure (Exercise) 126/68 125/62 162/76 154/70    Blood Pressure (Exit) 1_0 122/70    Heart Rate (Admit) 48 bpm 53 bpm 55 bpm 69 bpm    Heart Rate (Exercise) 99 bpm 97 bpm 127 bpm 126 bpm    Heart Rate (Exit) 55 bpm 62 bpm 62 bpm 72 bpm    Oxygen Saturation (Admit) 100 % -- -- --    Oxygen Saturation (Exercise) 94 % -- -- --    Rating of Perceived  Exertion (Exercise) _1 Perceived Dyspnea (Exercise) 1 -- -- --    Symptoms Hip Tightness none none none    Comments 6MWT Results 4th full day of exercise -- --    Duration -- Progress to 30 minutes of  aerobic without signs/symptoms of physical distress Continue with 30 min of aerobic exercise without signs/symptoms of physical distress. Continue with 30 min of aerobic exercise without signs/symptoms of physical distress.    Intensity -- THRR unchanged THRR unchanged THRR unchanged      Progression   Progression -- Continue to progress workloads to maintain intensity without signs/symptoms of physical distress. Continue to progress workloads to maintain intensity without signs/symptoms of physical distress. Continue to progress workloads to maintain intensity without signs/symptoms of physical distress.    Average METs -- 2.75 3.99 6.01      Resistance Training   Training Prescription -- Yes Yes Yes    Weight -- 5 lb 5 lb 6 lb    Reps -- 10-15 10-15 10-15      Interval Training   Interval Training -- No Yes Yes    Equipment -- -- Treadmill Treadmill    Comments -- -- Intervals of Incline 4-10% Incline 5-10%      Treadmill   MPH -- 2._2 Grade -- _3 Minutes -- _4 METs -- 3.17 7.43  7.4      Recumbant Bike   Level -- _0 Watts -- 20 34 76    Minutes -- _1 METs -- 2.62 3.05 --      NuStep   Level -- _2 Minutes -- _3 METs -- -- -- 4.2      Recumbant Elliptical   Level -- -- 2 --    Minutes -- -- 15 --    METs -- -- 2.6 --      Elliptical   Level -- -- 1 1    Speed -- -- 1 --    Minutes -- -- 10 15    METs -- -- -- 4.2      REL-XR   Level -- -- -- 13    Minutes -- -- -- 15    METs -- -- -- 9.5      Biostep-RELP   Level -- 2 2 --    Minutes -- 15 15 --    METs -- 3 3 --      Oxygen   Maintain Oxygen Saturation -- -- 88% or higher 88% or higher             Exercise Comments:   Exercise  Comments     Row Name 08/25/22 1025           Exercise Comments First full day of exercise!  Patient was oriented to gym and equipment including functions, settings, policies, and procedures.  Patient's individual exercise prescription and treatment plan were reviewed.  All starting workloads were established based on the results of the 6 minute walk test done at initial orientation visit.  The plan for exercise progression was also introduced and progression will be customized based on patient's performance and goals.                Exercise Goals and Review:   Exercise Goals     Row Name 08/21/22 1539             Exercise Goals   Increase Physical Activity Yes       Intervention Provide advice, education, support and counseling about physical activity/exercise needs.;Develop an individualized exercise prescription for aerobic and resistive training based on initial evaluation findings, risk stratification, comorbidities and participant's personal goals.       Expected Outcomes Short Term: Attend rehab on a regular basis to increase amount of physical activity.;Long Term: Add in home exercise to make exercise part of routine and to increase amount of physical activity.;Long Term: Exercising regularly at least 3-5 days a week.       Increase Strength and Stamina Yes       Intervention Provide advice, education, support and counseling about physical activity/exercise needs.;Develop an individualized exercise prescription for aerobic and resistive training based on initial evaluation findings, risk stratification, comorbidities and participant's personal goals.       Expected Outcomes Long Term: Improve cardiorespiratory fitness, muscular endurance and strength as measured by increased METs and functional capacity (6MWT);Short Term: Increase workloads from initial exercise prescription for resistance, speed, and METs.;Short Term: Perform resistance training exercises routinely during  rehab and add in resistance training at home       Able to understand and use rate of perceived exertion (RPE) scale Yes       Intervention Provide education and explanation on how to use RPE scale  Expected Outcomes Short Term: Able to use RPE daily in rehab to express subjective intensity level;Long Term:  Able to use RPE to guide intensity level when exercising independently       Able to understand and use Dyspnea scale Yes       Intervention Provide education and explanation on how to use Dyspnea scale       Expected Outcomes Short Term: Able to use Dyspnea scale daily in rehab to express subjective sense of shortness of breath during exertion;Long Term: Able to use Dyspnea scale to guide intensity level when exercising independently       Knowledge and understanding of Target Heart Rate Range (THRR) Yes       Intervention Provide education and explanation of THRR including how the numbers were predicted and where they are located for reference       Expected Outcomes Short Term: Able to state/look up THRR;Long Term: Able to use THRR to govern intensity when exercising independently;Short Term: Able to use daily as guideline for intensity in rehab       Able to check pulse independently Yes       Intervention Provide education and demonstration on how to check pulse in carotid and radial arteries.;Review the importance of being able to check your own pulse for safety during independent exercise       Expected Outcomes Short Term: Able to explain why pulse checking is important during independent exercise;Long Term: Able to check pulse independently and accurately       Understanding of Exercise Prescription Yes       Intervention Provide education, explanation, and written materials on patient's individual exercise prescription       Expected Outcomes Short Term: Able to explain program exercise prescription;Long Term: Able to explain home exercise prescription to exercise independently                 Exercise Goals Re-Evaluation :  Exercise Goals Re-Evaluation     Row Name 08/25/22 1025 09/02/22 1625 09/17/22 1623 10/01/22 1215       Exercise Goal Re-Evaluation   Exercise Goals Review Able to understand and use rate of perceived exertion (RPE) scale;Able to understand and use Dyspnea scale;Knowledge and understanding of Target Heart Rate Range (THRR);Understanding of Exercise Prescription Increase Physical Activity;Increase Strength and Stamina;Understanding of Exercise Prescription Increase Physical Activity;Increase Strength and Stamina;Understanding of Exercise Prescription Increase Physical Activity;Increase Strength and Stamina;Understanding of Exercise Prescription    Comments Reviewed RPE scale, THR and program prescription with pt today.  Pt voiced understanding and was given a copy of goals to take home. Joshua Barajas is doing well for his first couple of sessions he has been at rehab. He already increased his workload on the treadmill to 2.4 mph/ 1% incline. He is also now working at level 3 on the recumbent bike results in 20 watts, hoping to see that go up overtime. We will continue to monitor as he progresses. Joshua Barajas continues to do well in rehab. He recently increased his overall average MET level to 3.99 METs. He also improved to level 5 on the recumbent bike. Joshua Barajas has begun to do interval training on the treadmill as well at a speed of 3 mph with an incline ranging from 4-10%. He began using the elliptical and was able to tolerate level 1 for 10 minutes. We will continue to monitor his progress in the program. Joshua Barajas continues to do well in rehab. He substantially increased to level 12 on the bike working  up to 76 watts! He also increased significantly to level 13 on the XR. RPEs were in appropriate range. He continues to hit his THR each session. Will continue to monitor.    Expected Outcomes Short: Use RPE daily to regulate intensity. Long: Follow program prescription in THR.  Short: Increase watts on recumbent bike, follow exercise prescription Long: Build up overall strength and stamina Short: Continue to increase time on the elliptical. Long: Continue to increase strength and stamina. Short: Increase workload and duration on elliptical Long: Continue to increase overall MET level             Discharge Exercise Prescription (Final Exercise Prescription Changes):  Exercise Prescription Changes - 10/01/22 1200       Response to Exercise   Blood Pressure (Admit) 98/56    Blood Pressure (Exercise) 154/70    Blood Pressure (Exit) 122/70    Heart Rate (Admit) 69 bpm    Heart Rate (Exercise) 126 bpm    Heart Rate (Exit) 72 bpm    Rating of Perceived Exertion (Exercise) 15    Symptoms none    Duration Continue with 30 min of aerobic exercise without signs/symptoms of physical distress.    Intensity THRR unchanged      Progression   Progression Continue to progress workloads to maintain intensity without signs/symptoms of physical distress.    Average METs 6.01      Resistance Training   Training Prescription Yes    Weight 6 lb    Reps 10-15      Interval Training   Interval Training Yes    Equipment Treadmill    Comments Incline 5-10%      Treadmill   MPH 3    Grade 10    Minutes 15    METs 7.4      Recumbant Bike   Level 12    Watts 76    Minutes 15      NuStep   Level 4    Minutes 15    METs 4.2      Elliptical   Level 1    Minutes 15    METs 4.2      REL-XR   Level 13    Minutes 15    METs 9.5      Oxygen   Maintain Oxygen Saturation 88% or higher             Nutrition:  Target Goals: Understanding of nutrition guidelines, daily intake of sodium <1520m, cholesterol <2065m calories 30% from fat and 7% or less from saturated fats, daily to have 5 or more servings of fruits and vegetables.  Education: All About Nutrition: -Group instruction provided by verbal, written material, interactive activities, discussions,  models, and posters to present general guidelines for heart healthy nutrition including fat, fiber, MyPlate, the role of sodium in heart healthy nutrition, utilization of the nutrition label, and utilization of this knowledge for meal planning. Follow up email sent as well. Written material given at graduation. Flowsheet [facility]  Date 09/03/22  [facility] Review Code 1- Verbalizes Understanding       Biometrics:  Pre Biometrics - 08/21/22 1540       Pre Biometrics   Height 6' 2.5" (1.892 m)    Weight 221 lb 1.6 oz (100.3 kg)    Waist Circumference 38 inches    Hip Circumference 43 inches    Waist to Hip Ratio 0.88 %  BMI (Calculated) 28.02    Single Leg Stand 16.1 seconds   R             Nutrition Therapy Plan and Nutrition Goals:  Nutrition Therapy & Goals - 08/21/22 1050       Nutrition Therapy   Diet Heart healthy, low Na    Drug/Food Interactions Statins/Certain Fruits    Protein (specify units) 80-90g    Fiber 30 grams    Whole Grain Foods 3 servings    Saturated Fats 16 max. grams    Fruits and Vegetables 8 servings/day    Sodium 2 grams      Personal Nutrition Goals   Nutrition Goal ST: soymilk instead of almond milk, vegetarian protein to help add protein to meatless meals LT: practice MyPlate guidelines, meet protein needs    Comments 71 y.o. M admitted to cardiac rehab s/p stent. PMHx includes A.fib, CAD, OSA, prostate cancer, HLD. Relevant medications includes lipitor, vit B12, MVI w/ minerals. B: he does not like to eat in am, he likes coffee (some cream and stevia) L: smoothies with fruit (banana and berries) and cottage cheese or greek yogurt as well as almond milk and ground flaxseed. D: big baked potato with stuff on it, sometimes daughter will give him food like roast beef which he will add some vegetables to, he likes eggs with whole wheat toast with sometimes some deli meat,  minestrone soup, potato leek soup. he reports he will sometimes snack on some chips as well as nuts like walnuts. Drinks: water. Discussed general heart healthy eating and protein needs; recommended soymilk for smoothies instead of almond milk and vegetarian protein to meals without meat/chicken/fish such as greek yogurt, beans/lentils, eggs (also encouraged foods that increase protein moderately such as whole grains).      Intervention Plan   Intervention Prescribe, educate and counsel regarding individualized specific dietary modifications aiming towards targeted core components such as weight, hypertension, lipid management, diabetes, heart failure and other comorbidities.;Nutrition handout(s) given to patient.    Expected Outcomes Short Term Goal: Understand basic principles of dietary content, such as calories, fat, sodium, cholesterol and nutrients.;Short Term Goal: A plan has been developed with personal nutrition goals set during dietitian appointment.;Long Term Goal: Adherence to prescribed nutrition plan.             Nutrition Assessments:  MEDIFICTS Score Key: ?70 Need to make dietary changes  40-70 Heart Healthy Diet ? 40 Therapeutic Level Cholesterol Diet  Flowsheet Row Cardiac Rehab from 08/21/2022 in [facility] Cardiac and Pulmonary Rehab  Picture Your Plate Total Score on Admission 70      Picture Your Plate Scores: <76 Unhealthy dietary pattern with much room for improvement. 41-50 Dietary pattern unlikely to meet recommendations for good health and room for improvement. 51-60 More healthful dietary pattern, with some room for improvement.  >60 Healthy dietary pattern, although there may be some specific behaviors that could be improved.    Nutrition Goals Re-Evaluation:  Nutrition Goals Re-Evaluation     [place] Name 10/08/22 1000             Goals   Current Weight 227 lb (103 kg)       Nutrition Goal Try to lose some weight.       Comment Joshua Barajas wants to be aroung  215 pounds. He sometimes has bouts of fluid build up in his legs. He does not add any salt to his food but does not look at the content of whats  in the food.       Expected Outcome Short: check labels for sodium content. Long: maintain a reduced sodium diet.                Nutrition Goals Discharge (Final Nutrition Goals Re-Evaluation):  Nutrition Goals Re-Evaluation - 10/08/22 1000       Goals   Current Weight 227 lb (103 kg)    Nutrition Goal Try to lose some weight.    Comment Joshua Barajas wants to be aroung 215 pounds. He sometimes has bouts of fluid build up in his legs. He does not add any salt to his food but does not look at the content of whats in the food.    Expected Outcome Short: check labels for sodium content. Long: maintain a reduced sodium diet.             Psychosocial: Target Goals: Acknowledge presence or absence of significant depression and/or stress, maximize coping skills, provide positive support system. Participant is able to verbalize types and ability to use techniques and skills needed for reducing stress and depression.   Education: Stress, Anxiety, and Depression - Group verbal and visual presentation to define topics covered.  Reviews how body is impacted by stress, anxiety, and depression.  Also discusses healthy ways to reduce stress and to treat/manage anxiety and depression.  Written material given at graduation.   Education: Sleep Hygiene -Provides group verbal and written instruction about how sleep can affect your health.  Define sleep hygiene, discuss sleep cycles and impact of sleep habits. Review good sleep hygiene tips.    Initial Review & Psychosocial Screening:  Initial Psych Review & Screening - 08/13/22 1347       Initial Review   Current issues with None Identified      Family Dynamics   Good Support System? Yes    Comments He can look to his daughter for support. He lives alone with his daughter that lives close to him. Mads has  no dression or anxiety issues.      Barriers   Psychosocial barriers to participate in program The patient should benefit from training in stress management and relaxation.      Screening Interventions   Interventions Encouraged to exercise;To provide support and resources with identified psychosocial needs;Provide feedback about the scores to participant    Expected Outcomes Short Term goal: Utilizing psychosocial counselor, staff and physician to assist with identification of specific Stressors or current issues interfering with healing process. Setting desired goal for each stressor or current issue identified.;Long Term Goal: Stressors or current issues are controlled or eliminated.;Short Term goal: Identification and review with participant of any Quality of Life or Depression concerns found by scoring the questionnaire.;Long Term goal: The participant improves quality of Life and PHQ9 Scores as seen by post scores and/or verbalization of changes             Quality of Life Scores:   Quality of Life - 08/21/22 1515       Quality of Life   Select Quality of Life      Quality of Life Scores   Health/Function Pre 27.37 %    Socioeconomic Pre 30 %    Psych/Spiritual Pre 27.93 %    Family Pre 27.8 %    GLOBAL Pre 28.09 %            Scores of 19 and below usually indicate a poorer quality of life in these areas.  A difference of  2-3  points is a clinically meaningful difference.  A difference of 2-3 points in the total score of the Quality of Life Index has been associated with significant improvement in overall quality of life, self-image, physical symptoms, and general health in studies assessing change in quality of life.  PHQ-9: Review Flowsheet       08/21/2022  Depression screen PHQ 2/9  Decreased Interest 0  Down, Depressed, Hopeless 0  PHQ - 2 Score 0  Altered sleeping 0  Tired, decreased energy 0  Change in appetite 0  Feeling bad or failure about yourself  0   Trouble concentrating 0  Moving slowly or fidgety/restless 0  Suicidal thoughts 0  PHQ-9 Score 0  Difficult doing work/chores Not difficult at all   Interpretation of Total Score  Total Score Depression Severity:  1-4 = Minimal depression, 5-9 = Mild depression, 10-14 = Moderate depression, 15-19 = Moderately severe depression, 20-27 = Severe depression   Psychosocial Evaluation and Intervention:  Psychosocial Evaluation - 08/13/22 1349       Psychosocial Evaluation & Interventions   Interventions Encouraged to exercise with the program and follow exercise prescription;Relaxation education;Stress management education    Comments He can look to his daughter for support. He lives alone with his daughter that lives close to him. Joshua Barajas has no dression or anxiety issues.    Expected Outcomes Short: Start [facility] to help with mood. Long: Maintain a healthy mental state    Continue Psychosocial Services  Follow up required by staff             Psychosocial Re-Evaluation:  Psychosocial Re-Evaluation     Joshua Barajas Name 09/03/22 1010 10/08/22 0959           Psychosocial Re-Evaluation   Current issues with None Identified None Identified      Comments Joshua Barajas reports no stress concerns. He mentioned he sleeps with his CPAP and usually sleeps 8 hours. Today he woke up still feeling tired and thinks it may be the change in weather or his metoprolol. He is happy coming here and can already tell a difference in his stamina. He wants to use his sessions for exercise and will decide which education topics he will attend. Patient reports no issues with their current mental states, sleep, stress, depression or anxiety. Will follow up with patient in a few weeks for any changes.      Expected Outcomes Short: attend cardiac rehab for exercise and education. Long: maintain positive self care habits. Short: Continue to exercise regularly to support mental health and notify staff of any changes. Long:  maintain mental health and well being through teaching of rehab or prescribed medications independently.      Interventions -- Encouraged to attend Cardiac Rehabilitation for the exercise      Continue Psychosocial Services  Follow up required by staff Follow up required by staff               Psychosocial Discharge (Final Psychosocial Re-Evaluation):  Psychosocial Re-Evaluation - 10/08/22 0959       Psychosocial Re-Evaluation   Current issues with None Identified    Comments Patient reports no issues with their current mental states, sleep, stress, depression or anxiety. Will follow up with patient in a few weeks for any changes.    Expected Outcomes Short: Continue to exercise regularly to support mental health and notify staff of any changes. Long: maintain mental health and well being through teaching of rehab or prescribed medications independently.  Interventions Encouraged to attend Cardiac Rehabilitation for the exercise    Continue Psychosocial Services  Follow up required by staff             Vocational Rehabilitation: Provide vocational rehab assistance to qualifying candidates.   Vocational Rehab Evaluation & Intervention:  Vocational Rehab - 09/03/22 1010       Initial Vocational Rehab Evaluation & Intervention   Assessment shows need for Vocational Rehabilitation No             Education: Education Goals: Education classes will be provided on a variety of topics geared toward better understanding of heart health and risk factor modification. Participant will state understanding/return demonstration of topics presented as noted by education test scores.  Learning Barriers/Preferences:  Learning Barriers/Preferences - 08/13/22 1342       Learning Barriers/Preferences   Learning Barriers None    Learning Preferences None             General Cardiac Education Topics:  AED/CPR: - Group verbal and written instruction with the use of models to  demonstrate the basic use of the AED with the basic ABC's of resuscitation.   Anatomy and Cardiac Procedures: - Group verbal and visual presentation and models provide information about basic cardiac anatomy and function. Reviews the testing methods done to diagnose heart disease and the outcomes of the test results. Describes the treatment choices: Medical Management, Angioplasty, or Coronary Bypass Surgery for treating various heart conditions including Myocardial Infarction, Angina, Valve Disease, and Cardiac Arrhythmias.  Written material given at graduation. Flowsheet Row Cardiac Rehab from 09/03/2022 in [facility] Cardiac and Pulmonary Rehab  Education need identified 08/21/22       Medication Safety: - Group verbal and visual instruction to review commonly prescribed medications for heart and lung disease. Reviews the medication, class of the drug, and side effects. Includes the steps to properly store meds and maintain the prescription regimen.  Written material given at graduation.   Intimacy: - Group verbal instruction through game format to discuss how heart and lung disease can affect sexual intimacy. Written material given at graduation.. Flowsheet Row Cardiac Rehab from 09/03/2022 in [facility] Cardiac and Pulmonary Rehab  Date 08/27/22  Educator [facility]  Instruction Review Code 1- Verbalizes Understanding       Know Your Numbers and Heart Failure: - Group verbal and visual instruction to discuss disease risk factors for cardiac and pulmonary disease and treatment options.  Reviews associated critical values for Overweight/Obesity, Hypertension, Cholesterol, and Diabetes.  Discusses basics of heart failure: signs/symptoms and treatments.  Introduces Heart Failure Zone chart for action plan for heart failure.  Written material given at graduation.   Infection Prevention: - Provides verbal and written material to individual with discussion of infection control including proper hand washing  and proper equipment cleaning during exercise session. Flowsheet Row Cardiac Rehab from 09/03/2022 in [facility] Cardiac and Pulmonary Rehab  Date 08/13/22  Educator [facility]  Instruction Review Code 1- Verbalizes Understanding       Falls Prevention: - Provides verbal and written material to individual with discussion of falls prevention and safety. Flowsheet Row Cardiac Rehab from 09/03/2022 in [facility] Cardiac and Pulmonary Rehab  Date 08/13/22  Educator [facility]  Instruction Review Code 1- Verbalizes Understanding       Other: -Provides group and verbal instruction on various topics (see comments)   Knowledge Questionnaire Score:  Knowledge Questionnaire Score - 08/21/22 1516       Knowledge Questionnaire Score   Pre Score  23/26             Core Components/Risk Factors/Patient Goals at Admission:  Personal Goals and Risk Factors at Admission - 08/13/22 1342       Core Components/Risk Factors/Patient Goals on Admission    Weight Management Yes;Weight Loss    Intervention Weight Management: Develop a combined nutrition and exercise program designed to reach desired caloric intake, while maintaining appropriate intake of nutrient and fiber, sodium and fats, and appropriate energy expenditure required for the weight goal.;Weight Management: Provide education and appropriate resources to help participant work on and attain dietary goals.;Weight Management/Obesity: Establish reasonable short term and long term weight goals.    Expected Outcomes Short Term: Continue to assess and modify interventions until short term weight is achieved;Long Term: Adherence to nutrition and physical activity/exercise program aimed toward attainment of established weight goal;Weight Loss: Understanding of general recommendations for a balanced deficit meal plan, which promotes 1-2 lb weight loss per week and includes a negative energy balance of [phone] kcal/d;Understanding recommendations for meals to include  15-35% energy as protein, 25-35% energy from fat, 35-60% energy from carbohydrates, less than 234m of dietary cholesterol, 20-35 gm of total fiber daily;Understanding of distribution of calorie intake throughout the day with the consumption of 4-5 meals/snacks    Lipids Yes    Intervention Provide education and support for participant on nutrition & aerobic/resistive exercise along with prescribed medications to achieve LDL <716m HDL >4053m   Expected Outcomes Short Term: Participant states understanding of desired cholesterol values and is compliant with medications prescribed. Participant is following exercise prescription and nutrition guidelines.;Long Term: Cholesterol controlled with medications as prescribed, with individualized exercise RX and with personalized nutrition plan. Value goals: LDL < 47m29mDL > 40 mg.             Education:Diabetes - Individual verbal and written instruction to review signs/symptoms of diabetes, desired ranges of glucose level fasting, after meals and with exercise. Acknowledge that pre and post exercise glucose checks will be done for 3 sessions at entry of program.   Core Components/Risk Factors/Patient Goals Review:   Goals and Risk Factor Review     Row Name 09/03/22 1006 10/08/22 1004           Core Components/Risk Factors/Patient Goals Review   Personal Goals Review Weight Management/Obesity;Lipids Weight Management/Obesity;Hypertension      Review Joshua Barajas has been enjoying coming to cardiac rehab. He has noticed that his legs are feeling stronger already. He is planning on attending all his exercise sessions to help with his weight management. He also is been slowly implementing the nutritional changes. He reports no issues with blood pressure. He is taking his medication as prescribed. Greysen states his blood pressure has been doing well. He has not been checking at home as much but he does monitor his oxygen and HR. Informed him to check his  blood pressure at times at home to make sure his reading correlate to ours.      Expected Outcomes Short: attend cardiac rehab sessions to help with weight management. Long: indpendently manage risk factors Short: check blood pressure at home. Long: maintain blood pressure readings independently.               Core Components/Risk Factors/Patient Goals at Discharge (Final Review):   Goals and Risk Factor Review - 10/08/22 1004       Core Components/Risk Factors/Patient Goals Review   Personal Goals Review Weight Management/Obesity;Hypertension    Review Joshua Barajas  states his blood pressure has been doing well. He has not been checking at home as much but he does monitor his oxygen and HR. Informed him to check his blood pressure at times at home to make sure his reading correlate to ours.    Expected Outcomes Short: check blood pressure at home. Long: maintain blood pressure readings independently.             ITP Comments:  ITP Comments     Row Name 08/13/22 1341 08/21/22 1537 08/25/22 1025 09/17/22 0840 10/15/22 1103   ITP Comments Virtual Visit completed. Patient informed on EP and RD appointment and 6 Minute walk test. Patient also informed of patient health questionnaires on My Chart. Patient Verbalizes understanding. Visit diagnosis can be found in [facility] 07/31/22. Completed 6MWT and gym orientation. Initial ITP created and sent for review to Dr. Emily Filbert, Medical Director. First full day of exercise!  Patient was oriented to gym and equipment including functions, settings, policies, and procedures.  Patient's individual exercise prescription and treatment plan were reviewed.  All starting workloads were established based on the results of the 6 minute walk test done at initial orientation visit.  The plan for exercise progression was also introduced and progression will be customized based on patient's performance and goals. 30 Day review completed. Medical Director ITP review done,  changes made as directed, and signed approval by Medical Director.   new to program 30 Day review completed. Medical Director ITP review done, changes made as directed, and signed approval by Medical Director.            Comments:

## 2022-10-17 ENCOUNTER — Encounter: Payer: PPO | Admitting: *Deleted

## 2022-10-17 DIAGNOSIS — Z955 Presence of coronary angioplasty implant and graft: Secondary | ICD-10-CM

## 2022-10-17 NOTE — Progress Notes (Signed)
Daily Session Note  Patient Details  Name: TYRRELL STEPHENS MRN: [id number] Date of Birth: 11/29/1950 Referring Provider:   Flowsheet Row Cardiac Rehab from 08/21/2022 in [facility] Cardiac and Pulmonary Rehab  Referring Provider Isaias Cowman MD       Encounter Date: 10/17/2022  Check In:  Session Check In - 10/17/22 1035       Check-In   Supervising physician immediately available to respond to emergencies See telemetry face sheet for immediately available ER MD    Location [facility]-Cardiac & Pulmonary Rehab    Staff Present Heath Lark, RN, BSN, CCRP;Joseph [place], RCP,RRT,BSRT;Jessica Amorita, [place], [place], CCRP, CCET    Virtual Visit No    Medication changes reported     No    Fall or balance concerns reported    No    Warm-up and Cool-down Performed on first and last piece of equipment    Resistance Training Performed Yes    VAD Patient? No    PAD/SET Patient? No      Pain Assessment   Currently in Pain? No/denies                Social History   Tobacco Use  Smoking Status Former   Packs/day: 1.00   Years: 11.00   Total pack years: 11.00   Types: Cigarettes   Quit date: 04/10/1976   Years since quitting: 46.5  Smokeless Tobacco Never    Goals Met:  Independence with exercise equipment Exercise tolerated well No report of concerns or symptoms today  Goals Unmet:  Not Applicable  Comments: Pt able to follow exercise prescription today without complaint.  Will continue to monitor for progression.    Dr. Emily Filbert is Medical Director for [place].  Dr. Ottie Glazier is Medical Director for [facility] Pulmonary Rehabilitation.

## 2022-10-22 ENCOUNTER — Encounter: Payer: PPO | Admitting: *Deleted

## 2022-10-27 ENCOUNTER — Encounter: Payer: PPO | Attending: Cardiology | Admitting: *Deleted

## 2022-10-27 DIAGNOSIS — Z955 Presence of coronary angioplasty implant and graft: Secondary | ICD-10-CM | POA: Diagnosis not present

## 2022-10-27 NOTE — Progress Notes (Signed)
Daily Session Note  Patient Details  Name: Joshua Barajas MRN: [id number] Date of Birth: [birth year] Referring Provider:   Flowsheet Row Cardiac Rehab from 08/21/2022 in [facility] Cardiac and Pulmonary Rehab  Referring Provider Isaias Cowman MD       Encounter Date: 10/27/2022  Check In:  Session Check In - 10/27/22 1014       Check-In   Supervising physician immediately available to respond to emergencies See telemetry face sheet for immediately available ER MD    Location [facility]-Cardiac & Pulmonary Rehab    Staff Present Darlyne Russian, RN, Doyce Para, BS, ACSM CEP, Exercise Physiologist;Noah Tickle, BS, Exercise Physiologist    Virtual Visit No    Medication changes reported     No    Fall or balance concerns reported    No    Warm-up and Cool-down Performed on first and last piece of equipment    Resistance Training Performed Yes    VAD Patient? No    PAD/SET Patient? No      Pain Assessment   Currently in Pain? No/denies                Social History   Tobacco Use  Smoking Status Former   Packs/day: 1.00   Years: 11.00   Total pack years: 11.00   Types: Cigarettes   Quit date: 04/10/1976   Years since quitting: 46.5  Smokeless Tobacco Never    Goals Met:  Independence with exercise equipment Exercise tolerated well No report of concerns or symptoms today Strength training completed today  Goals Unmet:  Not Applicable  Comments: Pt able to follow exercise prescription today without complaint.  Will continue to monitor for progression.    Dr. Emily Filbert is Medical Director for [place].  Dr. Ottie Glazier is Medical Director for [facility] Pulmonary Rehabilitation.

## 2022-10-29 ENCOUNTER — Encounter: Payer: PPO | Admitting: *Deleted

## 2022-10-29 DIAGNOSIS — Z955 Presence of coronary angioplasty implant and graft: Secondary | ICD-10-CM

## 2022-10-29 NOTE — Progress Notes (Signed)
Daily Session Note  Patient Details  Name: Joshua Barajas MRN: [id number] Date of Birth: June 21, 1951 Referring Provider:   Flowsheet Row Cardiac Rehab from 08/21/2022 in [facility] Cardiac and Pulmonary Rehab  Referring Provider Isaias Cowman MD       Encounter Date: 10/29/2022  Check In:  Session Check In - 10/29/22 1017       Check-In   Supervising physician immediately available to respond to emergencies See telemetry face sheet for immediately available ER MD    Location [facility]-Cardiac & Pulmonary Rehab    Staff Present Darlyne Russian, RN, ADN;Joseph Tessie Fass, RCP,RRT,BSRT;Noah Tickle, BS, Exercise Physiologist    Virtual Visit No    Medication changes reported     No    Fall or balance concerns reported    No    Warm-up and Cool-down Performed on first and last piece of equipment    Resistance Training Performed Yes    VAD Patient? No    PAD/SET Patient? No      Pain Assessment   Currently in Pain? No/denies                Social History   Tobacco Use  Smoking Status Former   Packs/day: 1.00   Years: 11.00   Total pack years: 11.00   Types: Cigarettes   Quit date: 04/10/1976   Years since quitting: 46.5  Smokeless Tobacco Never    Goals Met:  Independence with exercise equipment Exercise tolerated well No report of concerns or symptoms today Strength training completed today  Goals Unmet:  Not Applicable  Comments: Pt able to follow exercise prescription today without complaint.  Will continue to monitor for progression.    Dr. Emily Filbert is Medical Director for [place].  Dr. Ottie Glazier is Medical Director for [facility] Pulmonary Rehabilitation.

## 2022-10-31 ENCOUNTER — Encounter: Payer: PPO | Admitting: *Deleted

## 2022-10-31 DIAGNOSIS — Z955 Presence of coronary angioplasty implant and graft: Secondary | ICD-10-CM | POA: Diagnosis not present

## 2022-10-31 NOTE — Progress Notes (Signed)
Daily Session Note  Patient Details  Name: Joshua Barajas MRN: [id number] Date of Birth: 10-12-1951 Referring Provider:   Flowsheet Row Cardiac Rehab from 08/21/2022 in [facility] Cardiac and Pulmonary Rehab  Referring Provider Isaias Cowman MD       Encounter Date: 10/31/2022  Check In:  Session Check In - 10/31/22 1042       Check-In   Supervising physician immediately available to respond to emergencies See telemetry face sheet for immediately available ER MD    Location [facility]-Cardiac & Pulmonary Rehab    Staff Present Heath Lark, RN, BSN, CCRP;Jessica [place], MA, RCEP, CCRP, CCET;Joseph [place], [place]    Virtual Visit No    Medication changes reported     No    Fall or balance concerns reported    No    Warm-up and Cool-down Performed on first and last piece of equipment    Resistance Training Performed Yes    VAD Patient? No    PAD/SET Patient? No      Pain Assessment   Currently in Pain? No/denies               Exercise Prescription Changes - 10/31/22 0900       Home Exercise Plan   Plans to continue exercise at Home (comment)   walking, yard work, [employer] Add 2 additional days to program exercise sessions.    Initial Home Exercises Provided 10/31/22             Social History   Tobacco Use  Smoking Status Former   Packs/day: 1.00   Years: 11.00   Total pack years: 11.00   Types: Cigarettes   Quit date: 04/10/1976   Years since quitting: 46.5  Smokeless Tobacco Never    Goals Met:  Independence with exercise equipment Exercise tolerated well No report of concerns or symptoms today  Goals Unmet:  Not Applicable  Comments: Pt able to follow exercise prescription today without complaint.  Will continue to monitor for progression.    Dr. Emily Filbert is Medical Director for [employer].  Dr. Ottie Glazier is Medical Director for [facility] Pulmonary Rehabilitation.

## 2022-11-03 ENCOUNTER — Encounter: Payer: PPO | Admitting: *Deleted

## 2022-11-03 DIAGNOSIS — Z955 Presence of coronary angioplasty implant and graft: Secondary | ICD-10-CM

## 2022-11-03 NOTE — Progress Notes (Signed)
Daily Session Note  Patient Details  Name: Joshua Barajas MRN: [id number] Date of Birth: 08/11/1951 Referring Provider:   Flowsheet Row Cardiac Rehab from 08/21/2022 in [facility] Cardiac and Pulmonary Rehab  Referring Provider Isaias Cowman MD       Encounter Date: 11/03/2022  Check In:  Session Check In - 11/03/22 1006       Check-In   Supervising physician immediately available to respond to emergencies See telemetry face sheet for immediately available ER MD    Location [facility]-Cardiac & Pulmonary Rehab    Staff Present Darlyne Russian, RN, Doyce Para, BS, ACSM CEP, Exercise Physiologist;Noah Tickle, BS, Exercise Physiologist    Virtual Visit No    Medication changes reported     No    Fall or balance concerns reported    No    Warm-up and Cool-down Performed on first and last piece of equipment    Resistance Training Performed Yes    VAD Patient? No    PAD/SET Patient? No      Pain Assessment   Currently in Pain? No/denies                Social History   Tobacco Use  Smoking Status Former   Packs/day: 1.00   Years: 11.00   Total pack years: 11.00   Types: Cigarettes   Quit date: 04/10/1976   Years since quitting: 46.5  Smokeless Tobacco Never    Goals Met:  Independence with exercise equipment Exercise tolerated well No report of concerns or symptoms today Strength training completed today  Goals Unmet:  Not Applicable  Comments: Pt able to follow exercise prescription today without complaint.  Will continue to monitor for progression.    Dr. Emily Filbert is Medical Director for [place].  Dr. Ottie Glazier is Medical Director for [facility] Pulmonary Rehabilitation.

## 2022-11-05 ENCOUNTER — Encounter: Payer: PPO | Admitting: *Deleted

## 2022-11-05 DIAGNOSIS — Z955 Presence of coronary angioplasty implant and graft: Secondary | ICD-10-CM

## 2022-11-05 NOTE — Progress Notes (Signed)
Daily Session Note  Patient Details  Name: Joshua Barajas MRN: [id number] Date of Birth: Oct 21, 1950 Referring Provider:   Flowsheet Row Cardiac Rehab from 08/21/2022 in [facility] Cardiac and Pulmonary Rehab  Referring Provider Isaias Cowman MD       Encounter Date: 11/05/2022  Check In:  Session Check In - 11/05/22 1021       Check-In   Supervising physician immediately available to respond to emergencies See telemetry face sheet for immediately available ER MD    Location [facility]-Cardiac & Pulmonary Rehab    Staff Present Darlyne Russian, RN, Dimple Nanas, BS, Exercise Physiologist;Joseph Tessie Fass, [place]    Virtual Visit No    Medication changes reported     No    Fall or balance concerns reported    No    Tobacco Cessation No Change    Warm-up and Cool-down Performed on first and last piece of equipment    Resistance Training Performed Yes    VAD Patient? No    PAD/SET Patient? No      Pain Assessment   Currently in Pain? No/denies                Social History   Tobacco Use  Smoking Status Former   Packs/day: 1.00   Years: 11.00   Total pack years: 11.00   Types: Cigarettes   Quit date: 04/10/1976   Years since quitting: 46.6  Smokeless Tobacco Never    Goals Met:  Independence with exercise equipment Exercise tolerated well No report of concerns or symptoms today Strength training completed today  Goals Unmet:  Not Applicable  Comments: Pt able to follow exercise prescription today without complaint.  Will continue to monitor for progression.    Dr. Emily Filbert is Medical Director for [place].  Dr. Ottie Glazier is Medical Director for [facility] Pulmonary Rehabilitation.

## 2022-11-07 ENCOUNTER — Encounter: Payer: PPO | Admitting: *Deleted

## 2022-11-07 DIAGNOSIS — Z955 Presence of coronary angioplasty implant and graft: Secondary | ICD-10-CM | POA: Diagnosis not present

## 2022-11-07 NOTE — Progress Notes (Signed)
Daily Session Note  Patient Details  Name: Joshua Barajas MRN: [id number] Date of Birth: [birth year] Referring Provider:   Flowsheet Row Cardiac Rehab from 08/21/2022 in [facility] Cardiac and Pulmonary Rehab  Referring Provider Isaias Cowman MD       Encounter Date: 11/07/2022  Check In:  Session Check In - 11/07/22 1034       Check-In   Supervising physician immediately available to respond to emergencies See telemetry face sheet for immediately available ER MD    Location [facility]-Cardiac & Pulmonary Rehab    Staff Present Marvell Fuller, PhD, RN, CNS, CEN;Geordie Nooney, RN, BSN, CCRP;Joseph Arboles, RCP,RRT,BSRT    Virtual Visit No    Medication changes reported     No    Fall or balance concerns reported    No    Warm-up and Cool-down Performed on first and last piece of equipment    Resistance Training Performed Yes    VAD Patient? No    PAD/SET Patient? No      Pain Assessment   Currently in Pain? No/denies                Social History   Tobacco Use  Smoking Status Former   Packs/day: 1.00   Years: 11.00   Total pack years: 11.00   Types: Cigarettes   Quit date: 04/10/1976   Years since quitting: 46.6  Smokeless Tobacco Never    Goals Met:  Independence with exercise equipment Exercise tolerated well No report of concerns or symptoms today  Goals Unmet:  Not Applicable  Comments: Pt able to follow exercise prescription today without complaint.  Will continue to monitor for progression.    Dr. Emily Filbert is Medical Director for [place].  Dr. Ottie Glazier is Medical Director for [facility] Pulmonary Rehabilitation.

## 2022-11-10 ENCOUNTER — Encounter: Payer: PPO | Admitting: *Deleted

## 2022-11-10 VITALS — Ht 74.5 in | Wt 229.4 lb

## 2022-11-10 DIAGNOSIS — Z955 Presence of coronary angioplasty implant and graft: Secondary | ICD-10-CM

## 2022-11-10 NOTE — Progress Notes (Signed)
Daily Session Note  Patient Details  Name: JAMARIO COLINA MRN: [id number] Date of Birth: Nov 14, 1950 Referring Provider:   Flowsheet Row Cardiac Rehab from 08/21/2022 in [facility] Cardiac and Pulmonary Rehab  Referring Provider Isaias Cowman MD       Encounter Date: 11/10/2022  Check In:  Session Check In - 11/10/22 1017       Check-In   Supervising physician immediately available to respond to emergencies See telemetry face sheet for immediately available ER MD    Location [facility]-Cardiac & Pulmonary Rehab    Staff Present Darlyne Russian, RN, Doyce Para, BS, ACSM CEP, Exercise Physiologist;Noah Tickle, BS, Exercise Physiologist    Virtual Visit No    Medication changes reported     No    Fall or balance concerns reported    No    Warm-up and Cool-down Performed on first and last piece of equipment    Resistance Training Performed Yes    VAD Patient? No    PAD/SET Patient? No      Pain Assessment   Currently in Pain? No/denies                Social History   Tobacco Use  Smoking Status Former   Packs/day: 1.00   Years: 11.00   Total pack years: 11.00   Types: Cigarettes   Quit date: 04/10/1976   Years since quitting: 46.6  Smokeless Tobacco Never    Goals Met:  Independence with exercise equipment Exercise tolerated well No report of concerns or symptoms today Strength training completed today  Goals Unmet:  Not Applicable  Comments: Pt able to follow exercise prescription today without complaint.  Will continue to monitor for progression.    Dr. Emily Filbert is Medical Director for [place].  Dr. Ottie Glazier is Medical Director for [facility] Pulmonary Rehabilitation.

## 2022-11-10 NOTE — Patient Instructions (Signed)
Discharge Patient Instructions  Patient Details  Name: Joshua Barajas MRN: [id number] Date of Birth: 03/24/1951 Referring Provider:  [name], MD   Number of Visits: 50  Reason for Discharge:  Patient reached a stable level of exercise. Patient independent in their exercise. Patient has met program and personal goals.  No diagnosis found.  Initial Exercise Prescription:  Initial Exercise Prescription - 08/21/22 1600       Date of Initial Exercise RX and Referring Provider   Date 08/21/22    Referring Provider Isaias Cowman MD      Oxygen   Maintain Oxygen Saturation 88% or higher      Treadmill   MPH 1.8    Grade 0.5    Minutes 15    METs 2.57      Recumbant Bike   Level 2    RPM 50    Watts 32    Minutes 15    METs 2.57      NuStep   Level 2    SPM 80    Minutes 15    METs 2.57      REL-XR   Level 1    Speed 50    Minutes 15    METs 2.57      Biostep-RELP   Level 2    SPM 50    Minutes 15    METs 2.57      Track   Laps 35    Minutes 15    METs 2.57      Prescription Details   Frequency (times per week) 3    Duration Progress to 30 minutes of continuous aerobic without signs/symptoms of physical distress      Intensity   THRR 40-80% of Max Heartrate 89-129    Ratings of Perceived Exertion 11-13    Perceived Dyspnea 0-4      Progression   Progression Continue to progress workloads to maintain intensity without signs/symptoms of physical distress.      Resistance Training   Training Prescription Yes    Weight 5 lb    Reps 10-15             Discharge Exercise Prescription (Final Exercise Prescription Changes):  Exercise Prescription Changes - 10/31/22 0900       Home Exercise Plan   Plans to continue exercise at Home (comment)   walking, yard work, [employer] Add 2 additional days to program exercise sessions.    Initial Home Exercises Provided 10/31/22             Functional  Capacity:  6 Minute Walk     Row Name 08/21/22 1538 11/10/22 1009       6 Minute Walk   Phase Initial Discharge    Distance 1355 feet 1350 feet    Distance % Change -- -0.3 %    Distance Feet Change -- -5 ft    Walk Time 6 minutes 6 minutes    # of Rest Breaks 0 0    MPH 2.57 2.56    METS 3.02 2.67    RPE 11 11    Perceived Dyspnea  1 0    VO2 Peak 10.58 9.35    Symptoms Yes (comment) No    Comments Hip Tightness --    Resting HR 48 bpm 49 bpm    Resting BP 112/58 110/62    Resting Oxygen Saturation  100 % 98 %    Exercise Oxygen Saturation  during 6 min walk 94 % 97 %    Max Ex. HR 99 bpm 62 bpm    Max Ex. BP 126/68 142/82    2 Minute Post BP 114/58 --             Nutrition & Weight - Outcomes:  Pre Biometrics - 08/21/22 1540       Pre Biometrics   Height 6' 2.5" (1.892 m)    Weight 221 lb 1.6 oz (100.3 kg)    Waist Circumference 38 inches    Hip Circumference 43 inches    Waist to Hip Ratio 0.88 %    BMI (Calculated) 28.02    Single Leg Stand 16.1 seconds   R            Post Biometrics - 11/10/22 1013        Post  Biometrics   Height 6' 2.5" (1.892 m)    Weight 229 lb 6.4 oz (104.1 kg)    Waist Circumference 38 inches    Hip Circumference 44 inches    Waist to Hip Ratio 0.86 %    BMI (Calculated) 29.07    Single Leg Stand 30 seconds             Nutrition:  Nutrition Therapy & Goals - 08/21/22 1050       Nutrition Therapy   Diet Heart healthy, low Na    Drug/Food Interactions Statins/Certain Fruits    Protein (specify units) 80-90g    Fiber 30 grams    Whole Grain Foods 3 servings    Saturated Fats 16 max. grams    Fruits and Vegetables 8 servings/day    Sodium 2 grams      Personal Nutrition Goals   Nutrition Goal ST: soymilk instead of almond milk, vegetarian protein to help add protein to meatless meals LT: practice MyPlate guidelines, meet protein needs    Comments 72 y.o. M admitted to cardiac rehab s/p stent. PMHx includes  A.fib, CAD, OSA, prostate cancer, HLD. Relevant medications includes lipitor, vit B12, MVI w/ minerals. B: he does not like to eat in am, he likes coffee (some cream and stevia) L: smoothies with fruit (banana and berries) and cottage cheese or greek yogurt as well as almond milk and ground flaxseed. D: big baked potato with stuff on it, sometimes daughter will give him food like roast beef which he will add some vegetables to, he likes eggs with whole wheat toast with sometimes some deli meat, minestrone soup, potato leek soup. he reports he will sometimes snack on some chips as well as nuts like walnuts. Drinks: water. Discussed general heart healthy eating and protein needs; recommended soymilk for smoothies instead of almond milk and vegetarian protein to meals without meat/chicken/fish such as greek yogurt, beans/lentils, eggs (also encouraged foods that increase protein moderately such as whole grains).      Intervention Plan   Intervention Prescribe, educate and counsel regarding individualized specific dietary modifications aiming towards targeted core components such as weight, hypertension, lipid management, diabetes, heart failure and other comorbidities.;Nutrition handout(s) given to patient.    Expected Outcomes Short Term Goal: Understand basic principles of dietary content, such as calories, fat, sodium, cholesterol and nutrients.;Short Term Goal: A plan has been developed with personal nutrition goals set during dietitian appointment.;Long Term Goal: Adherence to prescribed nutrition plan.               Goals reviewed with patient; copy given to patient.

## 2022-11-11 ENCOUNTER — Other Ambulatory Visit: Payer: PPO

## 2022-11-11 DIAGNOSIS — C61 Malignant neoplasm of prostate: Secondary | ICD-10-CM

## 2022-11-12 ENCOUNTER — Encounter: Payer: Self-pay | Admitting: *Deleted

## 2022-11-12 ENCOUNTER — Encounter: Payer: PPO | Admitting: *Deleted

## 2022-11-12 DIAGNOSIS — I77811 Abdominal aortic ectasia: Secondary | ICD-10-CM | POA: Diagnosis not present

## 2022-11-12 DIAGNOSIS — Z955 Presence of coronary angioplasty implant and graft: Secondary | ICD-10-CM

## 2022-11-12 DIAGNOSIS — G4733 Obstructive sleep apnea (adult) (pediatric): Secondary | ICD-10-CM | POA: Diagnosis not present

## 2022-11-12 DIAGNOSIS — E7801 Familial hypercholesterolemia: Secondary | ICD-10-CM | POA: Diagnosis not present

## 2022-11-12 DIAGNOSIS — R0602 Shortness of breath: Secondary | ICD-10-CM | POA: Diagnosis not present

## 2022-11-12 DIAGNOSIS — I480 Paroxysmal atrial fibrillation: Secondary | ICD-10-CM | POA: Diagnosis not present

## 2022-11-12 LAB — PSA: Prostate Specific Ag, Serum: <0.1 ng/mL (ref 0.0–4.0)

## 2022-11-12 NOTE — Progress Notes (Signed)
Daily Session Note  Patient Details  Name: Joshua Barajas MRN: [id number] Date of Birth: Jan 04, 1951 Referring Provider:   Flowsheet Row Cardiac Rehab from 08/21/2022 in [facility] Cardiac and Pulmonary Rehab  Referring Provider Isaias Cowman MD       Encounter Date: 11/12/2022  Check In:  Session Check In - 11/12/22 0830       Check-In   Supervising physician immediately available to respond to emergencies See telemetry face sheet for immediately available ER MD    Location [facility]-Cardiac & Pulmonary Rehab    Staff Present Darlyne Russian, RN, ADN;Joseph Tessie Fass, RCP,RRT,BSRT;Noah Tickle, BS, Exercise Physiologist    Virtual Visit No    Medication changes reported     No    Fall or balance concerns reported    No    Warm-up and Cool-down Performed on first and last piece of equipment    Resistance Training Performed Yes    VAD Patient? No    PAD/SET Patient? No      Pain Assessment   Currently in Pain? No/denies                Social History   Tobacco Use  Smoking Status Former   Packs/day: 1.00   Years: 11.00   Total pack years: 11.00   Types: Cigarettes   Quit date: 04/10/1976   Years since quitting: 46.6  Smokeless Tobacco Never    Goals Met:  Independence with exercise equipment Exercise tolerated well No report of concerns or symptoms today Strength training completed today  Goals Unmet:  Not Applicable  Comments: Pt able to follow exercise prescription today without complaint.  Will continue to monitor for progression.    Dr. Emily Filbert is Medical Director for [place].  Dr. Ottie Glazier is Medical Director for [facility] Pulmonary Rehabilitation.

## 2022-11-12 NOTE — Progress Notes (Signed)
Cardiac Individual Treatment Plan  Patient Details  Name: Joshua Barajas MRN: [id number] Date of Birth: 1951/02/13 Referring Provider:   Flowsheet Row Cardiac Rehab from 08/21/2022 in [facility] Cardiac and Pulmonary Rehab  Referring Provider Isaias Cowman MD       Initial Encounter Date:  Flowsheet Row Cardiac Rehab from 08/21/2022 in [facility] Cardiac and Pulmonary Rehab  Date 08/21/22       Visit Diagnosis: Status post coronary artery stent placement  Patient's Home Medications on Admission:  Current Outpatient Medications:    apixaban (ELIQUIS) 5 MG TABS tablet, Take 1 tablet (5 mg total) by mouth 2 (two) times daily., Disp: 60 tablet, Rfl: 2   aspirin EC 81 MG tablet, Take 81 mg by mouth daily. Swallow whole. (Patient not taking: Reported on 08/13/2022), Disp: , Rfl:    atorvastatin (LIPITOR) 80 MG tablet, Take 1 tablet (80 mg total) by mouth every evening., Disp: 30 tablet, Rfl: 3   atorvastatin (LIPITOR) 80 MG tablet, Take by mouth. (Patient not taking: Reported on 08/13/2022), Disp: , Rfl:    cholecalciferol (VITAMIN D3) 25 MCG (1000 UNIT) tablet, Take 1,000 Units by mouth daily., Disp: , Rfl:    clopidogrel (PLAVIX) 75 MG tablet, Take 1 tablet (75 mg) by mouth daily., Disp: , Rfl:    cyanocobalamin (VITAMIN B12) 250 MCG tablet, Take 250 mcg by mouth 2 (two) times a week., Disp: , Rfl:    metoprolol tartrate (LOPRESSOR) 25 MG tablet, Take 1 tablet (25 mg total) by mouth 2 (two) times daily., Disp: 60 tablet, Rfl: 2   Multiple Vitamin (MULTIVITAMIN) tablet, Take 1 tablet by mouth daily., Disp: , Rfl:    ticagrelor (BRILINTA) 90 MG TABS tablet, Take 1 tablet (90 mg total) by mouth 2 (two) times daily. (Patient not taking: Reported on 08/13/2022), Disp: 60 tablet, Rfl: 3  Past Medical History: Past Medical History:  Diagnosis Date   Dysrhythmia    ED (erectile dysfunction)    Hematuria    HLD (hyperlipidemia)    Over weight    Paroxysmal atrial fibrillation (HCC)     Prostate cancer (HCC)    Rising PSA level    Sleep apnea    CPAP   Squamous cell carcinoma     Tobacco Use: Social History   Tobacco Use  Smoking Status Former   Packs/day: 1.00   Years: 11.00   Total pack years: 11.00   Types: Cigarettes   Quit date: 04/10/1976   Years since quitting: 46.6  Smokeless Tobacco Never    Labs: Review Flowsheet       Latest Ref Rng & Units 11/21/2016  Labs for ITP Cardiac and Pulmonary Rehab  Cholestrol 0 - 200 mg/dL 143   LDL (calc) 0 - 99 mg/dL 96   HDL-C >40 mg/dL 34   Trlycerides <150 mg/dL 66      Exercise Target Goals: Exercise Program Goal: Individual exercise prescription set using results from initial 6 min walk test and THRR while considering  patient's activity barriers and safety.   Exercise Prescription Goal: Initial exercise prescription builds to 30-45 minutes a day of aerobic activity, 2-3 days per week.  Home exercise guidelines will be given to patient during program as part of exercise prescription that the participant will acknowledge.   Education: Aerobic Exercise: - Group verbal and visual presentation on the components of exercise prescription. Introduces F.I.T.T principle from ACSM for exercise prescriptions.  Reviews F.I.T.T. principles of aerobic exercise including progression. Written material given at graduation. Flowsheet  Row Cardiac Rehab from 09/03/2022 in [facility] Cardiac and Pulmonary Rehab  Education need identified 08/21/22  Date 08/27/22  Educator [facility]  Instruction Review Code 1- Verbalizes Understanding       Education: Resistance Exercise: - Group verbal and visual presentation on the components of exercise prescription. Introduces F.I.T.T principle from ACSM for exercise prescriptions  Reviews F.I.T.T. principles of resistance exercise including progression. Written material given at graduation.    Education: Exercise & Equipment Safety: - Individual verbal instruction and demonstration of equipment  use and safety with use of the equipment. Flowsheet Row Cardiac Rehab from 09/03/2022 in [facility] Cardiac and Pulmonary Rehab  Date 08/13/22  Educator [facility]  Instruction Review Code 1- Verbalizes Understanding       Education: Exercise Physiology & General Exercise Guidelines: - Group verbal and written instruction with models to review the exercise physiology of the cardiovascular system and associated critical values. Provides general exercise guidelines with specific guidelines to those with heart or lung disease.    Education: Flexibility, Balance, Mind/Body Relaxation: - Group verbal and visual presentation with interactive activity on the components of exercise prescription. Introduces F.I.T.T principle from ACSM for exercise prescriptions. Reviews F.I.T.T. principles of flexibility and balance exercise training including progression. Also discusses the mind body connection.  Reviews various relaxation techniques to help reduce and manage stress (i.e. Deep breathing, progressive muscle relaxation, and visualization). Balance handout provided to take home. Written material given at graduation.   Activity Barriers & Risk Stratification:  Activity Barriers & Cardiac Risk Stratification - 08/21/22 1539       Activity Barriers & Cardiac Risk Stratification   Activity Barriers None    Cardiac Risk Stratification Moderate             6 Minute Walk:  6 Minute Walk     Row Name 08/21/22 1538 11/10/22 1009       6 Minute Walk   Phase Initial Discharge    Distance 1355 feet 1350 feet    Distance % Change -- -0.3 %    Distance Feet Change -- -5 ft    Walk Time 6 minutes 6 minutes    # of Rest Breaks 0 0    MPH 2.57 2.56    METS 3.02 2.67    RPE 11 11    Perceived Dyspnea  1 0    VO2 Peak 10.58 9.35    Symptoms Yes (comment) No    Comments Hip Tightness --    Resting HR 48 bpm 49 bpm    Resting BP 112/58 110/62    Resting Oxygen Saturation  100 % 98 %    Exercise Oxygen  Saturation  during 6 min walk 94 % 97 %    Max Ex. HR 99 bpm 62 bpm    Max Ex. BP 126/68 142/82    2 Minute Post BP 114/58 --             Oxygen Initial Assessment:   Oxygen Re-Evaluation:   Oxygen Discharge (Final Oxygen Re-Evaluation):   Initial Exercise Prescription:  Initial Exercise Prescription - 08/21/22 1600       Date of Initial Exercise RX and Referring Provider   Date 08/21/22    Referring Provider Isaias Cowman MD      Oxygen   Maintain Oxygen Saturation 88% or higher      Treadmill   MPH 1.8    Grade 0.5    Minutes 15    METs 2.57  Recumbant Bike   Level 2    RPM 50    Watts 32    Minutes 15    METs 2.57      NuStep   Level 2    SPM 80    Minutes 15    METs 2.57      REL-XR   Level 1    Speed 50    Minutes 15    METs 2.57      Biostep-RELP   Level 2    SPM 50    Minutes 15    METs 2.57      Track   Laps 35    Minutes 15    METs 2.57      Prescription Details   Frequency (times per week) 3    Duration Progress to 30 minutes of continuous aerobic without signs/symptoms of physical distress      Intensity   THRR 40-80% of Max Heartrate 89-129    Ratings of Perceived Exertion 11-13    Perceived Dyspnea 0-4      Progression   Progression Continue to progress workloads to maintain intensity without signs/symptoms of physical distress.      Resistance Training   Training Prescription Yes    Weight 5 lb    Reps 10-15             Perform Capillary Blood Glucose checks as needed.  Exercise Prescription Changes:   Exercise Prescription Changes     Row Name 08/21/22 1600 09/02/22 1600 09/17/22 1600 10/01/22 1200 10/15/22 1500     Response to Exercise   Blood Pressure (Admit) 112/58 116/58 118/64 98/56 104/62   Blood Pressure (Exercise) 126/68 125/62 162/76 154/70 --   Blood Pressure (Exit) 1'14/58 96/62 90/58 '$ 122/70 104/60   Heart Rate (Admit) 48 bpm 53 bpm 55 bpm 69 bpm 52 bpm   Heart Rate (Exercise) 99  bpm 97 bpm 127 bpm 126 bpm 108 bpm   Heart Rate (Exit) 55 bpm 62 bpm 62 bpm 72 bpm 73 bpm   Oxygen Saturation (Admit) 100 % -- -- -- --   Oxygen Saturation (Exercise) 94 % -- -- -- --   Rating of Perceived Exertion (Exercise) '11 13 15 15 15   '$ Perceived Dyspnea (Exercise) 1 -- -- -- --   Symptoms Hip Tightness none none none none   Comments 6MWT Results 4th full day of exercise -- -- --   Duration -- Progress to 30 minutes of  aerobic without signs/symptoms of physical distress Continue with 30 min of aerobic exercise without signs/symptoms of physical distress. Continue with 30 min of aerobic exercise without signs/symptoms of physical distress. Continue with 30 min of aerobic exercise without signs/symptoms of physical distress.   Intensity -- THRR unchanged THRR unchanged THRR unchanged THRR unchanged     Progression   Progression -- Continue to progress workloads to maintain intensity without signs/symptoms of physical distress. Continue to progress workloads to maintain intensity without signs/symptoms of physical distress. Continue to progress workloads to maintain intensity without signs/symptoms of physical distress. Continue to progress workloads to maintain intensity without signs/symptoms of physical distress.   Average METs -- 2.75 3.99 6.01 4.79     Resistance Training   Training Prescription -- Yes Yes Yes Yes   Weight -- 5 lb 5 lb 6 lb 6 lb   Reps -- 10-15 10-15 10-15 10-15     Interval Training   Interval Training -- No Yes Yes Yes   Equipment -- --  Treadmill Treadmill Treadmill   Comments -- -- Intervals of Incline 4-10% Incline 5-10% Incline 5-15%     Treadmill   MPH -- 2.'4 3 3 3   '$ Grade -- '1 10 10 15   '$ Minutes -- '15 15 15 15   '$ METs -- 3.17 7.43 7.4 9.5     Recumbant Bike   Level -- '3 5 12 '$ --   Watts -- 20 34 76 --   Minutes -- '15 15 15 '$ --   METs -- 2.62 3.05 -- --     NuStep   Level -- '3 3 4 '$ --   Minutes -- '15 15 15 '$ --   METs -- -- -- 4.2 --     Recumbant  Elliptical   Level -- -- 2 -- --   Minutes -- -- 15 -- --   METs -- -- 2.6 -- --     Elliptical   Level -- -- '1 1 3   '$ Speed -- -- 1 -- 4.1   Minutes -- -- '10 15 15   '$ METs -- -- -- 4.2 --     REL-XR   Level -- -- -- 13 --   Minutes -- -- -- 15 --   METs -- -- -- 9.5 --     Biostep-RELP   Level -- 2 2 -- --   Minutes -- 15 15 -- --   METs -- 3 3 -- --     Oxygen   Maintain Oxygen Saturation -- -- 88% or higher 88% or higher 88% or higher    Row Name 10/29/22 1300 10/31/22 0900           Response to Exercise   Blood Pressure (Admit) 118/60 --      Blood Pressure (Exit) 122/64 --      Heart Rate (Admit) 68 bpm --      Heart Rate (Exercise) 102 bpm --      Heart Rate (Exit) 64 bpm --      Rating of Perceived Exertion (Exercise) 13 --      Symptoms none --      Duration Continue with 30 min of aerobic exercise without signs/symptoms of physical distress. --      Intensity THRR unchanged --        Progression   Progression Continue to progress workloads to maintain intensity without signs/symptoms of physical distress. --      Average METs 5.56 --        Resistance Training   Training Prescription Yes --      Weight 6 lb --      Reps 10-15 --        Interval Training   Interval Training Yes --      Equipment Treadmill --      Comments Incline intervals up to 15% --        Treadmill   MPH 3 --      Grade 10 --      Minutes 15 --      METs 7.4 --        Recumbant Bike   Level 12 --      Watts 72 --      Minutes 15 --      METs 4.2 --        NuStep   Level 8 --      Minutes 15 --        Home Exercise Plan   Plans to continue exercise at --  Home (comment)  walking, yard work, [employer] -- Add 2 additional days to program exercise sessions.      Initial Home Exercises Provided -- 10/31/22        Oxygen   Maintain Oxygen Saturation 88% or higher --               Exercise Comments:   Exercise Comments     Row Name 08/25/22  1025           Exercise Comments First full day of exercise!  Patient was oriented to gym and equipment including functions, settings, policies, and procedures.  Patient's individual exercise prescription and treatment plan were reviewed.  All starting workloads were established based on the results of the 6 minute walk test done at initial orientation visit.  The plan for exercise progression was also introduced and progression will be customized based on patient's performance and goals.                Exercise Goals and Review:   Exercise Goals     Row Name 08/21/22 1539             Exercise Goals   Increase Physical Activity Yes       Intervention Provide advice, education, support and counseling about physical activity/exercise needs.;Develop an individualized exercise prescription for aerobic and resistive training based on initial evaluation findings, risk stratification, comorbidities and participant's personal goals.       Expected Outcomes Short Term: Attend rehab on a regular basis to increase amount of physical activity.;Long Term: Add in home exercise to make exercise part of routine and to increase amount of physical activity.;Long Term: Exercising regularly at least 3-5 days a week.       Increase Strength and Stamina Yes       Intervention Provide advice, education, support and counseling about physical activity/exercise needs.;Develop an individualized exercise prescription for aerobic and resistive training based on initial evaluation findings, risk stratification, comorbidities and participant's personal goals.       Expected Outcomes Long Term: Improve cardiorespiratory fitness, muscular endurance and strength as measured by increased METs and functional capacity (6MWT);Short Term: Increase workloads from initial exercise prescription for resistance, speed, and METs.;Short Term: Perform resistance training exercises routinely during rehab and add in resistance training  at home       Able to understand and use rate of perceived exertion (RPE) scale Yes       Intervention Provide education and explanation on how to use RPE scale       Expected Outcomes Short Term: Able to use RPE daily in rehab to express subjective intensity level;Long Term:  Able to use RPE to guide intensity level when exercising independently       Able to understand and use Dyspnea scale Yes       Intervention Provide education and explanation on how to use Dyspnea scale       Expected Outcomes Short Term: Able to use Dyspnea scale daily in rehab to express subjective sense of shortness of breath during exertion;Long Term: Able to use Dyspnea scale to guide intensity level when exercising independently       Knowledge and understanding of Target Heart Rate Range (THRR) Yes       Intervention Provide education and explanation of THRR including how the numbers were predicted and where they are located for reference       Expected Outcomes Short Term: Able to state/look  up THRR;Long Term: Able to use THRR to govern intensity when exercising independently;Short Term: Able to use daily as guideline for intensity in rehab       Able to check pulse independently Yes       Intervention Provide education and demonstration on how to check pulse in carotid and radial arteries.;Review the importance of being able to check your own pulse for safety during independent exercise       Expected Outcomes Short Term: Able to explain why pulse checking is important during independent exercise;Long Term: Able to check pulse independently and accurately       Understanding of Exercise Prescription Yes       Intervention Provide education, explanation, and written materials on patient's individual exercise prescription       Expected Outcomes Short Term: Able to explain program exercise prescription;Long Term: Able to explain home exercise prescription to exercise independently                Exercise Goals  Re-Evaluation :  Exercise Goals Re-Evaluation     Row Name 08/25/22 1025 09/02/22 1625 09/17/22 1623 10/01/22 1215 10/15/22 1556     Exercise Goal Re-Evaluation   Exercise Goals Review Able to understand and use rate of perceived exertion (RPE) scale;Able to understand and use Dyspnea scale;Knowledge and understanding of Target Heart Rate Range (THRR);Understanding of Exercise Prescription Increase Physical Activity;Increase Strength and Stamina;Understanding of Exercise Prescription Increase Physical Activity;Increase Strength and Stamina;Understanding of Exercise Prescription Increase Physical Activity;Increase Strength and Stamina;Understanding of Exercise Prescription Increase Physical Activity;Increase Strength and Stamina;Understanding of Exercise Prescription   Comments Reviewed RPE scale, THR and program prescription with pt today.  Pt voiced understanding and was given a copy of goals to take home. Krishay is doing well for his first couple of sessions he has been at rehab. He already increased his workload on the treadmill to 2.4 mph/ 1% incline. He is also now working at level 3 on the recumbent bike results in 20 watts, hoping to see that go up overtime. We will continue to monitor as he progresses. Linas continues to do well in rehab. He recently increased his overall average MET level to 3.99 METs. He also improved to level 5 on the recumbent bike. Izik has begun to do interval training on the treadmill as well at a speed of 3 mph with an incline ranging from 4-10%. He began using the elliptical and was able to tolerate level 1 for 10 minutes. We will continue to monitor his progress in the program. Reedy continues to do well in rehab. He substantially increased to level 12 on the bike working up to 76 watts! He also increased significantly to level 13 on the XR. RPEs were in appropriate range. He continues to hit his THR each session. Will continue to monitor. Corrie continues to do well in  rehab. He has continued to do well on the elliptical as he was able to improve from level 1 to level 3. He also has consistently done well with intervals of incline on the treadmill ranging from 5-15%. We will continue to monitor his progress in the program.   Expected Outcomes Short: Use RPE daily to regulate intensity. Long: Follow program prescription in THR. Short: Increase watts on recumbent bike, follow exercise prescription Long: Build up overall strength and stamina Short: Continue to increase time on the elliptical. Long: Continue to increase strength and stamina. Short: Increase workload and duration on elliptical Long: Continue to increase overall MET level  Short: Continue interval training on the treadmill. Long: Continue to improve strength and stamina.    [place] Name 10/29/22 1320 10/31/22 0937           Exercise Goal Re-Evaluation   Exercise Goals Review Increase Physical Activity;Increase Strength and Stamina;Understanding of Exercise Prescription Increase Physical Activity;Increase Strength and Stamina;Understanding of Exercise Prescription      Comments Rufino is doing well in rehab. He continues exercising on the treadmill with intervals, varying the incline up to 15%. He also increased to level 8 on the T4 Nustep. He continues to reach his THR each session. Will continue to monitor. Carlee is doing well in rehab.  He has been enjoying coming to to rehab to use the equipment.  He has plans to join [employer] in [place] for after rehab.  He has noticed that his stamina is getting better and he is less wobbley now and has more strength. He has ordered some hand weights to use at home.  He is staying active each day by working out in the yard. Reviewed home exercise with pt today.  Pt plans to walk and work in the yard at home for exercise.  Reviewed THR, pulse, RPE, sign and symptoms, pulse oximetery and when to call 911 or MD.  Also discussed weather considerations and indoor options.  Pt  voiced understanding.      Expected Outcomes Short: Continue reaching THR with increased workloads Long: Continue to increase overall MET level Short: continue to work toward getting to [place]: conitnue to improve stamina               Discharge Exercise Prescription (Final Exercise Prescription Changes):  Exercise Prescription Changes - 10/31/22 0900       Home Exercise Plan   Plans to continue exercise at Home (comment)   walking, yard work, [employer] Add 2 additional days to program exercise sessions.    Initial Home Exercises Provided 10/31/22             Nutrition:  Target Goals: Understanding of nutrition guidelines, daily intake of sodium '1500mg'$ , cholesterol '200mg'$ , calories 30% from fat and 7% or less from saturated fats, daily to have 5 or more servings of fruits and vegetables.  Education: All About Nutrition: -Group instruction provided by verbal, written material, interactive activities, discussions, models, and posters to present general guidelines for heart healthy nutrition including fat, fiber, MyPlate, the role of sodium in heart healthy nutrition, utilization of the nutrition label, and utilization of this knowledge for meal planning. Follow up email sent as well. Written material given at graduation. Flowsheet Row Cardiac Rehab from 09/03/2022 in [facility] Cardiac and Pulmonary Rehab  Date 09/03/22  Educator [facility]  Instruction Review Code 1- Verbalizes Understanding       Biometrics:  Pre Biometrics - 08/21/22 1540       Pre Biometrics   Height 6' 2.5" (1.892 m)    Weight 221 lb 1.6 oz (100.3 kg)    Waist Circumference 38 inches    Hip Circumference 43 inches    Waist to Hip Ratio 0.88 %    BMI (Calculated) 28.02    Single Leg Stand 16.1 seconds   R            Post Biometrics - 11/10/22 1013        Post  Biometrics   Height 6' 2.5" (1.892 m)    Weight 229 lb 6.4 oz (104.1 kg)    Waist  Circumference 38 inches     Hip Circumference 44 inches    Waist to Hip Ratio 0.86 %    BMI (Calculated) 29.07    Single Leg Stand 30 seconds             Nutrition Therapy Plan and Nutrition Goals:  Nutrition Therapy & Goals - 08/21/22 1050       Nutrition Therapy   Diet Heart healthy, low Na    Drug/Food Interactions Statins/Certain Fruits    Protein (specify units) 80-90g    Fiber 30 grams    Whole Grain Foods 3 servings    Saturated Fats 16 max. grams    Fruits and Vegetables 8 servings/day    Sodium 2 grams      Personal Nutrition Goals   Nutrition Goal ST: soymilk instead of almond milk, vegetarian protein to help add protein to meatless meals LT: practice MyPlate guidelines, meet protein needs    Comments 72 y.o. M admitted to cardiac rehab s/p stent. PMHx includes A.fib, CAD, OSA, prostate cancer, HLD. Relevant medications includes lipitor, vit B12, MVI w/ minerals. B: he does not like to eat in am, he likes coffee (some cream and stevia) L: smoothies with fruit (banana and berries) and cottage cheese or greek yogurt as well as almond milk and ground flaxseed. D: big baked potato with stuff on it, sometimes daughter will give him food like roast beef which he will add some vegetables to, he likes eggs with whole wheat toast with sometimes some deli meat, minestrone soup, potato leek soup. he reports he will sometimes snack on some chips as well as nuts like walnuts. Drinks: water. Discussed general heart healthy eating and protein needs; recommended soymilk for smoothies instead of almond milk and vegetarian protein to meals without meat/chicken/fish such as greek yogurt, beans/lentils, eggs (also encouraged foods that increase protein moderately such as whole grains).      Intervention Plan   Intervention Prescribe, educate and counsel regarding individualized specific dietary modifications aiming towards targeted core components such as weight, hypertension, lipid management, diabetes, heart failure and  other comorbidities.;Nutrition handout(s) given to patient.    Expected Outcomes Short Term Goal: Understand basic principles of dietary content, such as calories, fat, sodium, cholesterol and nutrients.;Short Term Goal: A plan has been developed with personal nutrition goals set during dietitian appointment.;Long Term Goal: Adherence to prescribed nutrition plan.             Nutrition Assessments:  MEDIFICTS Score Key: ?70 Need to make dietary changes  40-70 Heart Healthy Diet ? 40 Therapeutic Level Cholesterol Diet  Flowsheet Row Cardiac Rehab from 11/12/2022 in [facility] Cardiac and Pulmonary Rehab  Picture Your Plate Total Score on Discharge 65      Picture Your Plate Scores: <93 Unhealthy dietary pattern with much room for improvement. 41-50 Dietary pattern unlikely to meet recommendations for good health and room for improvement. 51-60 More healthful dietary pattern, with some room for improvement.  >60 Healthy dietary pattern, although there may be some specific behaviors that could be improved.    Nutrition Goals Re-Evaluation:  Nutrition Goals Re-Evaluation     [place] Name 10/08/22 1000 10/31/22 0940           Goals   Current Weight 227 lb (103 kg) --      Nutrition Goal Try to lose some weight. Short: check labels for sodium content. Long: maintain a reduced sodium diet.      Comment Erich wants to be aroung 215  pounds. He sometimes has bouts of fluid build up in his legs. He does not add any salt to his food but does not look at the content of whats in the food. Clayten is doing well in rehab.Marland Kitchen  He is reading the food labels and it just emphasized what he was already doing.  He tends to eat mostly fresh foods overall. He continues to keep eye on sodium levels.      Expected Outcome Short: check labels for sodium content. Long: maintain a reduced sodium diet. Short: conitnue to watch portion sizes Long: Continue to eat heart healthy               Nutrition Goals  Discharge (Final Nutrition Goals Re-Evaluation):  Nutrition Goals Re-Evaluation - 10/31/22 0940       Goals   Nutrition Goal Short: check labels for sodium content. Long: maintain a reduced sodium diet.    Comment Kendell is doing well in rehab.Marland Kitchen  He is reading the food labels and it just emphasized what he was already doing.  He tends to eat mostly fresh foods overall. He continues to keep eye on sodium levels.    Expected Outcome Short: conitnue to watch portion sizes Long: Continue to eat heart healthy             Psychosocial: Target Goals: Acknowledge presence or absence of significant depression and/or stress, maximize coping skills, provide positive support system. Participant is able to verbalize types and ability to use techniques and skills needed for reducing stress and depression.   Education: Stress, Anxiety, and Depression - Group verbal and visual presentation to define topics covered.  Reviews how body is impacted by stress, anxiety, and depression.  Also discusses healthy ways to reduce stress and to treat/manage anxiety and depression.  Written material given at graduation.   Education: Sleep Hygiene -Provides group verbal and written instruction about how sleep can affect your health.  Define sleep hygiene, discuss sleep cycles and impact of sleep habits. Review good sleep hygiene tips.    Initial Review & Psychosocial Screening:  Initial Psych Review & Screening - 08/13/22 1347       Initial Review   Current issues with None Identified      Family Dynamics   Good Support System? Yes    Comments He can look to his daughter for support. He lives alone with his daughter that lives close to him. Terre has no dression or anxiety issues.      Barriers   Psychosocial barriers to participate in program The patient should benefit from training in stress management and relaxation.      Screening Interventions   Interventions Encouraged to exercise;To provide support  and resources with identified psychosocial needs;Provide feedback about the scores to participant    Expected Outcomes Short Term goal: Utilizing psychosocial counselor, staff and physician to assist with identification of specific Stressors or current issues interfering with healing process. Setting desired goal for each stressor or current issue identified.;Long Term Goal: Stressors or current issues are controlled or eliminated.;Short Term goal: Identification and review with participant of any Quality of Life or Depression concerns found by scoring the questionnaire.;Long Term goal: The participant improves quality of Life and PHQ9 Scores as seen by post scores and/or verbalization of changes             Quality of Life Scores:   Quality of Life - 11/12/22 0840       Quality of Life Scores  Health/Function Pre 27.37 %    Health/Function Post 28.4 %    Health/Function % Change 3.76 %    Socioeconomic Pre 30 %    Socioeconomic Post 30 %    Socioeconomic % Change  0 %    Psych/Spiritual Pre 27.93 %    Psych/Spiritual Post 25.71 %    Psych/Spiritual % Change -7.95 %    Family Pre 27.8 %    Family Post 30 %    Family % Change 7.91 %    GLOBAL Pre 28.09 %    GLOBAL Post 28.31 %    GLOBAL % Change 0.78 %            Scores of 19 and below usually indicate a poorer quality of life in these areas.  A difference of  2-3 points is a clinically meaningful difference.  A difference of 2-3 points in the total score of the Quality of Life Index has been associated with significant improvement in overall quality of life, self-image, physical symptoms, and general health in studies assessing change in quality of life.  PHQ-9: Review Flowsheet       11/12/2022 08/21/2022  Depression screen PHQ 2/9  Decreased Interest 0 0  Down, Depressed, Hopeless 0 0  PHQ - 2 Score 0 0  Altered sleeping 0 0  Tired, decreased energy 1 0  Change in appetite 0 0  Feeling bad or failure about yourself   0 0  Trouble concentrating 0 0  Moving slowly or fidgety/restless 0 0  Suicidal thoughts 0 0  PHQ-9 Score 1 0  Difficult doing work/chores Not difficult at all Not difficult at all   Interpretation of Total Score  Total Score Depression Severity:  1-4 = Minimal depression, 5-9 = Mild depression, 10-14 = Moderate depression, 15-19 = Moderately severe depression, 20-27 = Severe depression   Psychosocial Evaluation and Intervention:  Psychosocial Evaluation - 08/13/22 1349       Psychosocial Evaluation & Interventions   Interventions Encouraged to exercise with the program and follow exercise prescription;Relaxation education;Stress management education    Comments He can look to his daughter for support. He lives alone with his daughter that lives close to him. Yoniel has no dression or anxiety issues.    Expected Outcomes Short: Start [facility] to help with mood. Long: Maintain a healthy mental state    Continue Psychosocial Services  Follow up required by staff             Psychosocial Re-Evaluation:  Psychosocial Re-Evaluation     [place] Name 09/03/22 1010 10/08/22 0959 10/31/22 0939         Psychosocial Re-Evaluation   Current issues with None Identified None Identified None Identified     Comments Thorne reports no stress concerns. He mentioned he sleeps with his CPAP and usually sleeps 8 hours. Today he woke up still feeling tired and thinks it may be the change in weather or his metoprolol. He is happy coming here and can already tell a difference in his stamina. He wants to use his sessions for exercise and will decide which education topics he will attend. Patient reports no issues with their current mental states, sleep, stress, depression or anxiety. Will follow up with patient in a few weeks for any changes. Curlie is doing well in rehab.  He is feeling good mentally and denies any major stressors.  He sleeps well for most part, but he does have some nights where does not  sleep as well.  He does wear his CPAP each night.     Expected Outcomes Short: attend cardiac rehab for exercise and education. Long: maintain positive self care habits. Short: Continue to exercise regularly to support mental health and notify staff of any changes. Long: maintain mental health and well being through teaching of rehab or prescribed medications independently. short: Conitnued CPAP compliance Long: Continue to stay postivie     Interventions -- Encouraged to attend Cardiac Rehabilitation for the exercise Encouraged to attend Cardiac Rehabilitation for the exercise     Continue Psychosocial Services  Follow up required by staff Follow up required by staff Follow up required by staff              Psychosocial Discharge (Final Psychosocial Re-Evaluation):  Psychosocial Re-Evaluation - 10/31/22 0939       Psychosocial Re-Evaluation   Current issues with None Identified    Comments Laterrance is doing well in rehab.  He is feeling good mentally and denies any major stressors.  He sleeps well for most part, but he does have some nights where does not sleep as well.  He does wear his CPAP each night.    Expected Outcomes short: Conitnued CPAP compliance Long: Continue to stay postivie    Interventions Encouraged to attend Cardiac Rehabilitation for the exercise    Continue Psychosocial Services  Follow up required by staff             Vocational Rehabilitation: Provide vocational rehab assistance to qualifying candidates.   Vocational Rehab Evaluation & Intervention:  Vocational Rehab - 09/03/22 1010       Initial Vocational Rehab Evaluation & Intervention   Assessment shows need for Vocational Rehabilitation No             Education: Education Goals: Education classes will be provided on a variety of topics geared toward better understanding of heart health and risk factor modification. Participant will state understanding/return demonstration of topics presented as  noted by education test scores.  Learning Barriers/Preferences:  Learning Barriers/Preferences - 08/13/22 1342       Learning Barriers/Preferences   Learning Barriers None    Learning Preferences None             General Cardiac Education Topics:  AED/CPR: - Group verbal and written instruction with the use of models to demonstrate the basic use of the AED with the basic ABC's of resuscitation.   Anatomy and Cardiac Procedures: - Group verbal and visual presentation and models provide information about basic cardiac anatomy and function. Reviews the testing methods done to diagnose heart disease and the outcomes of the test results. Describes the treatment choices: Medical Management, Angioplasty, or Coronary Bypass Surgery for treating various heart conditions including Myocardial Infarction, Angina, Valve Disease, and Cardiac Arrhythmias.  Written material given at graduation. Flowsheet Row Cardiac Rehab from 09/03/2022 in [facility] Cardiac and Pulmonary Rehab  Education need identified 08/21/22       Medication Safety: - Group verbal and visual instruction to review commonly prescribed medications for heart and lung disease. Reviews the medication, class of the drug, and side effects. Includes the steps to properly store meds and maintain the prescription regimen.  Written material given at graduation.   Intimacy: - Group verbal instruction through game format to discuss how heart and lung disease can affect sexual intimacy. Written material given at graduation.. Flowsheet Row Cardiac Rehab from 09/03/2022 in [facility] Cardiac and Pulmonary Rehab  Date 08/27/22  Educator [facility]  Instruction Review Code 1-  Verbalizes Understanding       Know Your Numbers and Heart Failure: - Group verbal and visual instruction to discuss disease risk factors for cardiac and pulmonary disease and treatment options.  Reviews associated critical values for Overweight/Obesity, Hypertension, Cholesterol,  and Diabetes.  Discusses basics of heart failure: signs/symptoms and treatments.  Introduces Heart Failure Zone chart for action plan for heart failure.  Written material given at graduation.   Infection Prevention: - Provides verbal and written material to individual with discussion of infection control including proper hand washing and proper equipment cleaning during exercise session. Flowsheet Row Cardiac Rehab from 09/03/2022 in [facility] Cardiac and Pulmonary Rehab  Date 08/13/22  Educator [facility]  Instruction Review Code 1- Verbalizes Understanding       Falls Prevention: - Provides verbal and written material to individual with discussion of falls prevention and safety. Flowsheet Row Cardiac Rehab from 09/03/2022 in [facility] Cardiac and Pulmonary Rehab  Date 08/13/22  Educator [facility]  Instruction Review Code 1- Verbalizes Understanding       Other: -Provides group and verbal instruction on various topics (see comments)   Knowledge Questionnaire Score:  Knowledge Questionnaire Score - 11/12/22 0836       Knowledge Questionnaire Score   Pre Score 23/26    Post Score 24/26             Core Components/Risk Factors/Patient Goals at Admission:  Personal Goals and Risk Factors at Admission - 08/13/22 1342       Core Components/Risk Factors/Patient Goals on Admission    Weight Management Yes;Weight Loss    Intervention Weight Management: Develop a combined nutrition and exercise program designed to reach desired caloric intake, while maintaining appropriate intake of nutrient and fiber, sodium and fats, and appropriate energy expenditure required for the weight goal.;Weight Management: Provide education and appropriate resources to help participant work on and attain dietary goals.;Weight Management/Obesity: Establish reasonable short term and long term weight goals.    Expected Outcomes Short Term: Continue to assess and modify interventions until short term weight is achieved;Long  Term: Adherence to nutrition and physical activity/exercise program aimed toward attainment of established weight goal;Weight Loss: Understanding of general recommendations for a balanced deficit meal plan, which promotes 1-2 lb weight loss per week and includes a negative energy balance of [phone] kcal/d;Understanding recommendations for meals to include 15-35% energy as protein, 25-35% energy from fat, 35-60% energy from carbohydrates, less than '200mg'$  of dietary cholesterol, 20-35 gm of total fiber daily;Understanding of distribution of calorie intake throughout the day with the consumption of 4-5 meals/snacks    Lipids Yes    Intervention Provide education and support for participant on nutrition & aerobic/resistive exercise along with prescribed medications to achieve LDL '70mg'$ , HDL >'40mg'$ .    Expected Outcomes Short Term: Participant states understanding of desired cholesterol values and is compliant with medications prescribed. Participant is following exercise prescription and nutrition guidelines.;Long Term: Cholesterol controlled with medications as prescribed, with individualized exercise RX and with personalized nutrition plan. Value goals: LDL < '70mg'$ , HDL > 40 mg.             Education:Diabetes - Individual verbal and written instruction to review signs/symptoms of diabetes, desired ranges of glucose level fasting, after meals and with exercise. Acknowledge that pre and post exercise glucose checks will be done for 3 sessions at entry of program.   Core Components/Risk Factors/Patient Goals Review:   Goals and Risk Factor Review     Row Name 09/03/22 1006 10/08/22 1004  10/31/22 0942         Core Components/Risk Factors/Patient Goals Review   Personal Goals Review Weight Management/Obesity;Lipids Weight Management/Obesity;Hypertension Weight Management/Obesity;Hypertension     Review Gaberiel has been enjoying coming to cardiac rehab. He has noticed that his legs are feeling stronger  already. He is planning on attending all his exercise sessions to help with his weight management. He also is been slowly implementing the nutritional changes. He reports no issues with blood pressure. He is taking his medication as prescribed. Nino states his blood pressure has been doing well. He has not been checking at home as much but he does monitor his oxygen and HR. Informed him to check his blood pressure at times at home to make sure his reading correlate to ours. Paton is doing well in rehab.  He is holding steady for most part with his weight, if he goes up, it will come back down.  His pressures are doing well and he continues to check them at home.     Expected Outcomes Short: attend cardiac rehab sessions to help with weight management. Long: indpendently manage risk factors Short: check blood pressure at home. Long: maintain blood pressure readings independently. Short: Continue to keep eye on weight Long: conitnue to montior risk factors.              Core Components/Risk Factors/Patient Goals at Discharge (Final Review):   Goals and Risk Factor Review - 10/31/22 0942       Core Components/Risk Factors/Patient Goals Review   Personal Goals Review Weight Management/Obesity;Hypertension    Review Sundance is doing well in rehab.  He is holding steady for most part with his weight, if he goes up, it will come back down.  His pressures are doing well and he continues to check them at home.    Expected Outcomes Short: Continue to keep eye on weight Long: conitnue to montior risk factors.             ITP Comments:  ITP Comments     Row Name 08/13/22 1341 08/21/22 1537 08/25/22 1025 09/17/22 0840 10/15/22 1103   ITP Comments Virtual Visit completed. Patient informed on EP and RD appointment and 6 Minute walk test. Patient also informed of patient health questionnaires on My Chart. Patient Verbalizes understanding. Visit diagnosis can be found in [facility] 07/31/22. Completed 6MWT and  gym orientation. Initial ITP created and sent for review to Dr. Emily Filbert, Medical Director. First full day of exercise!  Patient was oriented to gym and equipment including functions, settings, policies, and procedures.  Patient's individual exercise prescription and treatment plan were reviewed.  All starting workloads were established based on the results of the 6 minute walk test done at initial orientation visit.  The plan for exercise progression was also introduced and progression will be customized based on patient's performance and goals. 30 Day review completed. Medical Director ITP review done, changes made as directed, and signed approval by Medical Director.   new to program 30 Day review completed. Medical Director ITP review done, changes made as directed, and signed approval by Medical Director.    [place] Name 11/12/22 1035           ITP Comments 30 Day review completed. Medical Director ITP review done, changes made as directed, and signed approval by Medical Director.                Comments:

## 2022-11-14 ENCOUNTER — Encounter: Payer: PPO | Admitting: *Deleted

## 2022-11-14 DIAGNOSIS — I480 Paroxysmal atrial fibrillation: Secondary | ICD-10-CM | POA: Diagnosis not present

## 2022-11-14 DIAGNOSIS — C61 Malignant neoplasm of prostate: Secondary | ICD-10-CM | POA: Diagnosis not present

## 2022-11-14 DIAGNOSIS — D649 Anemia, unspecified: Secondary | ICD-10-CM | POA: Diagnosis not present

## 2022-11-14 DIAGNOSIS — Z955 Presence of coronary angioplasty implant and graft: Secondary | ICD-10-CM

## 2022-11-14 DIAGNOSIS — Z79899 Other long term (current) drug therapy: Secondary | ICD-10-CM | POA: Diagnosis not present

## 2022-11-14 DIAGNOSIS — Z0000 Encounter for general adult medical examination without abnormal findings: Secondary | ICD-10-CM | POA: Diagnosis not present

## 2022-11-14 DIAGNOSIS — I77811 Abdominal aortic ectasia: Secondary | ICD-10-CM | POA: Diagnosis not present

## 2022-11-14 DIAGNOSIS — G4733 Obstructive sleep apnea (adult) (pediatric): Secondary | ICD-10-CM | POA: Diagnosis not present

## 2022-11-14 NOTE — Progress Notes (Signed)
Daily Session Note  Patient Details  Name: Joshua Barajas MRN: [id number] Date of Birth: 10/01/1951 Referring Provider:   Flowsheet Row Cardiac Rehab from 08/21/2022 in [facility] Cardiac and Pulmonary Rehab  Referring Provider Isaias Cowman MD       Encounter Date: 11/14/2022  Check In:  Session Check In - 11/14/22 1151       Check-In   Supervising physician immediately available to respond to emergencies See telemetry face sheet for immediately available ER MD    Location [facility]-Cardiac & Pulmonary Rehab    Staff Present Heath Lark, RN, BSN, CCRP;Jessica Tippecanoe, MA, RCEP, CCRP, CCET;Joseph [place], [place]    Virtual Visit No    Medication changes reported     No    Fall or balance concerns reported    No    Warm-up and Cool-down Performed on first and last piece of equipment    Resistance Training Performed Yes    VAD Patient? No    PAD/SET Patient? No      Pain Assessment   Currently in Pain? No/denies                Social History   Tobacco Use  Smoking Status Former   Packs/day: 1.00   Years: 11.00   Total pack years: 11.00   Types: Cigarettes   Quit date: 04/10/1976   Years since quitting: 46.6  Smokeless Tobacco Never    Goals Met:  Independence with exercise equipment Exercise tolerated well No report of concerns or symptoms today  Goals Unmet:  Not Applicable  Comments: Pt able to follow exercise prescription today without complaint.  Will continue to monitor for progression.    Dr. Emily Filbert is Medical Director for [place].  Dr. Ottie Glazier is Medical Director for [facility] Pulmonary Rehabilitation.

## 2022-11-17 ENCOUNTER — Encounter: Payer: PPO | Admitting: *Deleted

## 2022-11-17 DIAGNOSIS — Z955 Presence of coronary angioplasty implant and graft: Secondary | ICD-10-CM | POA: Diagnosis not present

## 2022-11-17 NOTE — Progress Notes (Signed)
Daily Session Note  Patient Details  Name: Joshua Barajas MRN: [id number] Date of Birth: 1951-06-26 Referring Provider:   Flowsheet Row Cardiac Rehab from 08/21/2022 in [facility] Cardiac and Pulmonary Rehab  Referring Provider Isaias Cowman MD       Encounter Date: 11/17/2022  Check In:  Session Check In - 11/17/22 1009       Check-In   Supervising physician immediately available to respond to emergencies See telemetry face sheet for immediately available ER MD    Location [facility]-Cardiac & Pulmonary Rehab    Staff Present Darlyne Russian, RN, Doyce Para, BS, ACSM CEP, Exercise Physiologist;Noah Tickle, BS, Exercise Physiologist    Virtual Visit No    Medication changes reported     No    Fall or balance concerns reported    No    Warm-up and Cool-down Performed on first and last piece of equipment    Resistance Training Performed Yes    VAD Patient? No    PAD/SET Patient? No      Pain Assessment   Currently in Pain? No/denies                Social History   Tobacco Use  Smoking Status Former   Packs/day: 1.00   Years: 11.00   Total pack years: 11.00   Types: Cigarettes   Quit date: 04/10/1976   Years since quitting: 46.6  Smokeless Tobacco Never    Goals Met:  Independence with exercise equipment Exercise tolerated well No report of concerns or symptoms today Strength training completed today  Goals Unmet:  Not Applicable  Comments: Pt able to follow exercise prescription today without complaint.  Will continue to monitor for progression.    Dr. Emily Filbert is Medical Director for [place].  Dr. Ottie Glazier is Medical Director for [facility] Pulmonary Rehabilitation.

## 2022-11-19 ENCOUNTER — Encounter: Payer: PPO | Admitting: *Deleted

## 2022-11-19 DIAGNOSIS — Z955 Presence of coronary angioplasty implant and graft: Secondary | ICD-10-CM | POA: Diagnosis not present

## 2022-11-19 NOTE — Progress Notes (Signed)
Daily Session Note  Patient Details  Name: Joshua Barajas MRN: [id number] Date of Birth: 1951/02/27 Referring Provider:   Flowsheet Row Cardiac Rehab from 08/21/2022 in [facility] Cardiac and Pulmonary Rehab  Referring Provider Isaias Cowman MD       Encounter Date: 11/19/2022  Check In:  Session Check In - 11/19/22 0954       Check-In   Supervising physician immediately available to respond to emergencies See telemetry face sheet for immediately available ER MD    Location [facility]-Cardiac & Pulmonary Rehab    Staff Present Darlyne Russian, RN, ADN;Susanne Bice, RN, BSN, CCRP;Meredith Sherryll Burger, RN BSN;Noah Tickle, BS, Exercise Physiologist    Virtual Visit No    Medication changes reported     No    Fall or balance concerns reported    No    Warm-up and Cool-down Performed on first and last piece of equipment    Resistance Training Performed Yes    VAD Patient? No    PAD/SET Patient? No      Pain Assessment   Currently in Pain? No/denies                Social History   Tobacco Use  Smoking Status Former   Packs/day: 1.00   Years: 11.00   Total pack years: 11.00   Types: Cigarettes   Quit date: 04/10/1976   Years since quitting: 46.6  Smokeless Tobacco Never    Goals Met:  Independence with exercise equipment Exercise tolerated well No report of concerns or symptoms today Strength training completed today  Goals Unmet:  Not Applicable  Comments: Pt able to follow exercise prescription today without complaint.  Will continue to monitor for progression.    Dr. Emily Filbert is Medical Director for [place].  Dr. Ottie Glazier is Medical Director for [facility] Pulmonary Rehabilitation.

## 2022-11-20 NOTE — Progress Notes (Unsigned)
11/21/2022 9:44 AM   Joshua Barajas [birth year] [id number]  Referring provider: [name], MD [address],  [place] [address]  Urological history: 1. Prostate cancer -PSA (10/2022) <0.1 -underwent prostate biopsy 2016 for an accelerating PSA velocity most recently to 3.2 -initial prostate biopsy 2/16 revealed Gleason 3+3 prostate cancer involving 6 of 12 cores bilaterally, 4 on the LEFT (lateral base, lateral mid, and lateral apex) and 2 on the RIGHT (base up to 83% tissue).    His prostate exam was fairly unremarkable, diffusely firm.  TRUS vol 26 cc.    -Repeat prostate biopsy on 04/13/15 showed Gleason 3+3 in 6/12 cores, 4 on the right and 2 on the left this time involving up on 33% at the left apex but otherwise farily low volume diease -transferred his care to [facility] and elected to undergo CyberKnife radiation (36 Gy completed 10/09/2015)   2. Nocturia -Risk factors for nocturia: obstructive sleep apnea and hypertension -uses CPAP  Chief Complaint  Patient presents with   Prostate Cancer     HPI: Joshua Barajas is a 72 y.o. male who presents today for yearly follow up.      IPSS     Row Name 11/21/22 0900         International Prostate Symptom Score   How often have you had the sensation of not emptying your bladder? Not at All     How often have you had to urinate less than every two hours? Less than 1 in 5 times     How often have you found you stopped and started again several times when you urinated? Not at All     How often have you found it difficult to postpone urination? Less than half the time     How often have you had a weak urinary stream? Not at All     How often have you had to strain to start urination? Not at All     How many times did you typically get up at night to urinate? 3 Times     Total IPSS Score 6       Quality of Life due to urinary symptoms   If you were to spend the rest of your life with your urinary condition just  the way it is now how would you feel about that? Pleased              Score:  1-7 Mild 8-19 Moderate 20-35 Severe   PMH: Past Medical History:  Diagnosis Date   Dysrhythmia    ED (erectile dysfunction)    Hematuria    HLD (hyperlipidemia)    Over weight    Paroxysmal atrial fibrillation (HCC)    Prostate cancer ([facility])    Rising PSA level    Sleep apnea    CPAP   Squamous cell carcinoma     Surgical History: Past Surgical History:  Procedure Laterality Date   CATARACT EXTRACTION Bilateral    CLOSED REDUCTION NASAL FRACTURE N/A 03/12/2021   Procedure: CLOSED REDUCTION NASAL FRACTURE;  Surgeon: Clyde Canterbury, MD;  Location: [facility];  Service: ENT;  Laterality: N/A;  sleep apnea   COLONOSCOPY     COLONOSCOPY WITH PROPOFOL N/A 09/28/2020   Procedure: COLONOSCOPY WITH PROPOFOL;  Surgeon: Lesly Rubenstein, MD;  Location: [facility];  Service: Endoscopy;  Laterality: N/A;   CORONARY STENT INTERVENTION N/A 07/30/2022   Procedure: CORONARY STENT INTERVENTION;  Surgeon: Isaias Cowman, MD;  Location: [facility];  Service: Cardiovascular;  Laterality: N/A;   EYE SURGERY     LEFT HEART CATH AND CORONARY ANGIOGRAPHY N/A 07/30/2022   Procedure: LEFT HEART CATH AND CORONARY ANGIOGRAPHY;  Surgeon: Isaias Cowman, MD;  Location: [facility];  Service: Cardiovascular;  Laterality: N/A;   SEPTOPLASTY     TONSILECTOMY, ADENOIDECTOMY, BILATERAL MYRINGOTOMY AND TUBES     TONSILLECTOMY      Home Medications:  Allergies as of 11/21/2022       Reactions   Latex Rash   Bandaids(only) if left on for a few days.        Medication List        Accurate as of November 21, 2022  9:44 AM. If you have any questions, ask your nurse or doctor.          apixaban 5 MG Tabs tablet Commonly known as: ELIQUIS Take 1 tablet (5 mg total) by mouth 2 (two) times daily.   aspirin EC 81 MG tablet Take 81 mg by mouth daily. Swallow whole.    atorvastatin 80 MG tablet Commonly known as: LIPITOR Take 1 tablet (80 mg total) by mouth every evening.   atorvastatin 80 MG tablet Commonly known as: LIPITOR Take by mouth.   cholecalciferol 25 MCG (1000 UNIT) tablet Commonly known as: VITAMIN D3 Take 1,000 Units by mouth daily.   clopidogrel 75 MG tablet Commonly known as: PLAVIX Take 1 tablet (75 mg) by mouth daily.   cyanocobalamin 250 MCG tablet Commonly known as: VITAMIN B12 Take 250 mcg by mouth 2 (two) times a week.   metoprolol tartrate 25 MG tablet Commonly known as: LOPRESSOR Take 1 tablet (25 mg total) by mouth 2 (two) times daily.   multivitamin tablet Take 1 tablet by mouth daily.   ticagrelor 90 MG Tabs tablet Commonly known as: BRILINTA Take 1 tablet (90 mg total) by mouth 2 (two) times daily.        Allergies:  Allergies  Allergen Reactions   Latex Rash    Bandaids(only) if left on for a few days.    Family History: Family History  Problem Relation Age of Onset   Heart attack Father    Rheum arthritis Father    Heart disease Paternal Uncle     Social History:  reports that he quit smoking about 46 years ago. His smoking use included cigarettes. He has a 11.00 pack-year smoking history. He has never used smokeless tobacco. He reports that he does not drink alcohol and does not use drugs.  ROS: For pertinent review of systems please refer to history of present illness  Physical Exam: BP 136/80   Pulse 74   Ht '6\' 3"'$  (1.905 m)   Wt 226 lb (102.5 kg)   BMI 28.25 kg/m   Constitutional:  Well nourished. Alert and oriented, No acute distress. HEENT: [place] AT, mask in place.  Trachea midline Cardiovascular: No clubbing, cyanosis, or edema. Respiratory: Normal respiratory effort, no increased work of breathing. Neurologic: Grossly intact, no focal deficits, moving all 4 extremities. Psychiatric: Normal mood and affect.   Laboratory Data: Serum creatinine (10/2022) 0.8 Total cholesterol  (10/2022) 98 TSH (10/2022) 1.685 I have reviewed the labs.    Assessment & Plan:    1. Prostate cancer -PSA remains undetectable  2. Nocturia -sleeping with CPAP -has lower extremity edema -not bothersome  3. Mixed incontinence -only occurs when he holds bladder for long periods of time -not bothersome  No follow-ups on  file.  Zara Council, PA-C  [facility] [address], [place], [place] [address] [phone]

## 2022-11-21 ENCOUNTER — Ambulatory Visit: Payer: PPO | Admitting: Urology

## 2022-11-21 ENCOUNTER — Encounter: Payer: PPO | Attending: Cardiology | Admitting: *Deleted

## 2022-11-21 ENCOUNTER — Encounter: Payer: Self-pay | Admitting: Urology

## 2022-11-21 VITALS — BP 136/80 | HR 74 | Ht 75.0 in | Wt 226.0 lb

## 2022-11-21 DIAGNOSIS — Z955 Presence of coronary angioplasty implant and graft: Secondary | ICD-10-CM | POA: Insufficient documentation

## 2022-11-21 DIAGNOSIS — N3946 Mixed incontinence: Secondary | ICD-10-CM | POA: Diagnosis not present

## 2022-11-21 DIAGNOSIS — R351 Nocturia: Secondary | ICD-10-CM | POA: Diagnosis not present

## 2022-11-21 DIAGNOSIS — C61 Malignant neoplasm of prostate: Secondary | ICD-10-CM | POA: Diagnosis not present

## 2022-11-21 NOTE — Progress Notes (Signed)
Daily Session Note  Patient Details  Name: Joshua Barajas MRN: [id number] Date of Birth: 1951/09/02 Referring Provider:   Flowsheet Row Cardiac Rehab from 08/21/2022 in [facility]  Referring Provider Isaias Cowman MD       Encounter Date: 11/21/2022  Check In:  Session Check In - 11/21/22 1140       Check-In   Supervising physician immediately available to respond to emergencies See telemetry face sheet for immediately available ER MD    Location [facility]-Cardiac [facility]    Staff Present Heath Lark, RN, BSN, CCRP;Jessica Garrett, MA, RCEP, CCRP, CCET;Joseph [place], [place]    Virtual Visit No    Medication changes reported     No    Fall or balance concerns reported    No    Warm-up and Cool-down Performed on first and last piece of equipment    Resistance Training Performed Yes    VAD Patient? No    PAD/SET Patient? No      Pain Assessment   Currently in Pain? No/denies                Social History   Tobacco Use  Smoking Status Former   Packs/day: 1.00   Years: 11.00   Total pack years: 11.00   Types: Cigarettes   Quit date: 04/10/1976   Years since quitting: 46.6  Smokeless Tobacco Never    Goals Met:  Independence with exercise equipment Exercise tolerated well No report of concerns or symptoms today  Goals Unmet:  Not Applicable  Comments: Pt able to follow exercise prescription today without complaint.  Will continue to monitor for progression.    Dr. Emily Filbert is Medical Director for [facility].  Dr. Ottie Glazier is Medical Director for [facility].

## 2022-11-24 ENCOUNTER — Encounter: Payer: PPO | Admitting: *Deleted

## 2022-11-24 DIAGNOSIS — Z955 Presence of coronary angioplasty implant and graft: Secondary | ICD-10-CM | POA: Diagnosis not present

## 2022-11-24 NOTE — Progress Notes (Signed)
Cardiac Individual Treatment Plan  Patient Details  Name: WINDEL KEZIAH MRN: [id number] Date of Birth: 72 Referring Provider:   Flowsheet Row Cardiac Rehab from 08/21/2022 in [facility] Cardiac and Pulmonary Rehab  Referring Provider Isaias Cowman MD       Initial Encounter Date:  Flowsheet Row Cardiac Rehab from 08/21/2022 in [facility] Cardiac and Pulmonary Rehab  Date 08/21/22       Visit Diagnosis: Status post coronary artery stent placement  Patient's Home Medications on Admission:  Current Outpatient Medications:    apixaban (ELIQUIS) 5 MG TABS tablet, Take 1 tablet (5 mg total) by mouth 2 (two) times daily., Disp: 60 tablet, Rfl: 2   aspirin EC 81 MG tablet, Take 81 mg by mouth daily. Swallow whole., Disp: , Rfl:    atorvastatin (LIPITOR) 80 MG tablet, Take 1 tablet (80 mg total) by mouth every evening., Disp: 30 tablet, Rfl: 3   atorvastatin (LIPITOR) 80 MG tablet, Take by mouth., Disp: , Rfl:    cholecalciferol (VITAMIN D3) 25 MCG (1000 UNIT) tablet, Take 1,000 Units by mouth daily., Disp: , Rfl:    clopidogrel (PLAVIX) 75 MG tablet, Take 1 tablet (75 mg) by mouth daily., Disp: , Rfl:    cyanocobalamin (VITAMIN B12) 250 MCG tablet, Take 250 mcg by mouth 2 (two) times a week., Disp: , Rfl:    metoprolol tartrate (LOPRESSOR) 25 MG tablet, Take 1 tablet (25 mg total) by mouth 2 (two) times daily., Disp: 60 tablet, Rfl: 2   Multiple Vitamin (MULTIVITAMIN) tablet, Take 1 tablet by mouth daily., Disp: , Rfl:    ticagrelor (BRILINTA) 90 MG TABS tablet, Take 1 tablet (90 mg total) by mouth 2 (two) times daily., Disp: 60 tablet, Rfl: 3  Past Medical History: Past Medical History:  Diagnosis Date   Dysrhythmia    ED (erectile dysfunction)    Hematuria    HLD (hyperlipidemia)    Over weight    Paroxysmal atrial fibrillation (HCC)    Prostate cancer (HCC)    Rising PSA level    Sleep apnea    CPAP   Squamous cell carcinoma     Tobacco Use: Social History   Tobacco  Use  Smoking Status Former   Packs/day: 1.00   Years: 11.00   Total pack years: 11.00   Types: Cigarettes   Quit date: 04/10/1976   Years since quitting: 72  Smokeless Tobacco Never    Labs: Review Flowsheet       Latest Ref Rng & Units 11/21/2016  Labs for ITP Cardiac and Pulmonary Rehab  Cholestrol 0 - 200 mg/dL 143   LDL (calc) 0 - 99 mg/dL 96   HDL-C >40 mg/dL 34   Trlycerides <150 mg/dL 66      Exercise Target Goals: Exercise Program Goal: Individual exercise prescription set using results from initial 6 min walk test and THRR while considering  patient's activity barriers and safety.   Exercise Prescription Goal: Initial exercise prescription builds to 30-45 minutes a day of aerobic activity, 2-3 days per week.  Home exercise guidelines will be given to patient during program as part of exercise prescription that the participant will acknowledge.   Education: Aerobic Exercise: - Group verbal and visual presentation on the components of exercise prescription. Introduces F.I.T.T principle from ACSM for exercise prescriptions.  Reviews F.I.T.T. principles of aerobic exercise including progression. Written material given at graduation. Flowsheet Row Cardiac Rehab from 09/03/2022 in [facility] Cardiac and Pulmonary Rehab  Education need identified 08/21/22  Date  08/27/22  Educator [place]  Instruction Review Code 1- Verbalizes Understanding       Education: Resistance Exercise: - Group verbal and visual presentation on the components of exercise prescription. Introduces F.I.T.T principle from ACSM for exercise prescriptions  Reviews F.I.T.T. principles of resistance exercise including progression. Written material given at graduation.    Education: Exercise & Equipment Safety: - Individual verbal instruction and demonstration of equipment use and safety with use of the equipment. Flowsheet Row Cardiac Rehab from 09/03/2022 in [facility] Cardiac and Pulmonary Rehab  Date 08/13/22   Educator [facility]  Instruction Review Code 1- Verbalizes Understanding       Education: Exercise Physiology & General Exercise Guidelines: - Group verbal and written instruction with models to review the exercise physiology of the cardiovascular system and associated critical values. Provides general exercise guidelines with specific guidelines to those with heart or lung disease.    Education: Flexibility, Balance, Mind/Body Relaxation: - Group verbal and visual presentation with interactive activity on the components of exercise prescription. Introduces F.I.T.T principle from ACSM for exercise prescriptions. Reviews F.I.T.T. principles of flexibility and balance exercise training including progression. Also discusses the mind body connection.  Reviews various relaxation techniques to help reduce and manage stress (i.e. Deep breathing, progressive muscle relaxation, and visualization). Balance handout provided to take home. Written material given at graduation.   Activity Barriers & Risk Stratification:  Activity Barriers & Cardiac Risk Stratification - 08/21/22 1539       Activity Barriers & Cardiac Risk Stratification   Activity Barriers None    Cardiac Risk Stratification Moderate             6 Minute Walk:  6 Minute Walk     Row Name 08/21/22 1538 11/10/22 1009       6 Minute Walk   Phase Initial Discharge    Distance 1355 feet 1350 feet    Distance % Change -- -0.3 %    Distance Feet Change -- -5 ft    Walk Time 6 minutes 6 minutes    # of Rest Breaks 0 0    MPH 2.57 2.56    METS 3.02 2.67    RPE 11 11    Perceived Dyspnea  1 0    VO2 Peak 10.58 9.35    Symptoms Yes (comment) No    Comments Hip Tightness --    Resting HR 48 bpm 49 bpm    Resting BP 112/58 110/62    Resting Oxygen Saturation  100 % 98 %    Exercise Oxygen Saturation  during 6 min walk 94 % 97 %    Max Ex. HR 99 bpm 62 bpm    Max Ex. BP 126/68 142/82    2 Minute Post BP 114/58 --              Oxygen Initial Assessment:   Oxygen Re-Evaluation:   Oxygen Discharge (Final Oxygen Re-Evaluation):   Initial Exercise Prescription:  Initial Exercise Prescription - 08/21/22 1600       Date of Initial Exercise RX and Referring Provider   Date 08/21/22    Referring Provider Isaias Cowman MD      Oxygen   Maintain Oxygen Saturation 88% or higher      Treadmill   MPH 1.8    Grade 0.5    Minutes 15    METs 2.57      Recumbant Bike   Level 2    RPM 50    Watts 32  Minutes 15    METs 2.57      NuStep   Level 2    SPM 80    Minutes 15    METs 2.57      REL-XR   Level 1    Speed 50    Minutes 15    METs 2.57      Biostep-RELP   Level 2    SPM 50    Minutes 15    METs 2.57      Track   Laps 35    Minutes 15    METs 2.57      Prescription Details   Frequency (times per week) 3    Duration Progress to 30 minutes of continuous aerobic without signs/symptoms of physical distress      Intensity   THRR 40-80% of Max Heartrate 89-129    Ratings of Perceived Exertion 11-13    Perceived Dyspnea 0-4      Progression   Progression Continue to progress workloads to maintain intensity without signs/symptoms of physical distress.      Resistance Training   Training Prescription Yes    Weight 5 lb    Reps 10-15             Perform Capillary Blood Glucose checks as needed.  Exercise Prescription Changes:   Exercise Prescription Changes     Row Name 08/21/22 1600 09/02/22 1600 09/17/22 1600 10/01/22 1200 10/15/22 1500     Response to Exercise   Blood Pressure (Admit) 112/58 116/58 118/64 98/56 104/62   Blood Pressure (Exercise) 126/68 125/62 162/76 154/70 --   Blood Pressure (Exit) 1'14/58 96/62 90/58 '$ 122/70 104/60   Heart Rate (Admit) 48 bpm 53 bpm 55 bpm 69 bpm 52 bpm   Heart Rate (Exercise) 99 bpm 97 bpm 127 bpm 126 bpm 108 bpm   Heart Rate (Exit) 55 bpm 62 bpm 62 bpm 72 bpm 73 bpm   Oxygen Saturation (Admit) 100 % -- -- -- --    Oxygen Saturation (Exercise) 94 % -- -- -- --   Rating of Perceived Exertion (Exercise) '11 13 15 15 15   '$ Perceived Dyspnea (Exercise) 1 -- -- -- --   Symptoms Hip Tightness none none none none   Comments 6MWT Results 4th full day of exercise -- -- --   Duration -- Progress to 30 minutes of  aerobic without signs/symptoms of physical distress Continue with 30 min of aerobic exercise without signs/symptoms of physical distress. Continue with 30 min of aerobic exercise without signs/symptoms of physical distress. Continue with 30 min of aerobic exercise without signs/symptoms of physical distress.   Intensity -- THRR unchanged THRR unchanged THRR unchanged THRR unchanged     Progression   Progression -- Continue to progress workloads to maintain intensity without signs/symptoms of physical distress. Continue to progress workloads to maintain intensity without signs/symptoms of physical distress. Continue to progress workloads to maintain intensity without signs/symptoms of physical distress. Continue to progress workloads to maintain intensity without signs/symptoms of physical distress.   Average METs -- 2.75 3.99 6.01 4.79     Resistance Training   Training Prescription -- Yes Yes Yes Yes   Weight -- 5 lb 5 lb 6 lb 6 lb   Reps -- 10-15 10-15 10-15 10-15     Interval Training   Interval Training -- No Yes Yes Yes   Equipment -- -- Treadmill Treadmill Treadmill   Comments -- -- Intervals of Incline 4-10% Incline 5-10% Incline 5-15%  Treadmill   MPH -- 2.'4 3 3 3   '$ Grade -- '1 10 10 15   '$ Minutes -- '15 15 15 15   '$ METs -- 3.17 7.43 7.4 9.5     Recumbant Bike   Level -- '3 5 12 '$ --   Watts -- 20 34 76 --   Minutes -- '15 15 15 '$ --   METs -- 2.62 3.05 -- --     NuStep   Level -- '3 3 4 '$ --   Minutes -- '15 15 15 '$ --   METs -- -- -- 4.2 --     Recumbant Elliptical   Level -- -- 2 -- --   Minutes -- -- 15 -- --   METs -- -- 2.6 -- --     Elliptical   Level -- -- '1 1 3   '$ Speed -- -- 1 --  4.1   Minutes -- -- '10 15 15   '$ METs -- -- -- 4.2 --     REL-XR   Level -- -- -- 13 --   Minutes -- -- -- 15 --   METs -- -- -- 9.5 --     Biostep-RELP   Level -- 2 2 -- --   Minutes -- 15 15 -- --   METs -- 3 3 -- --     Oxygen   Maintain Oxygen Saturation -- -- 88% or higher 88% or higher 88% or higher    Row Name 10/29/22 1300 10/31/22 0900 11/13/22 1400         Response to Exercise   Blood Pressure (Admit) 118/60 -- 102/58     Blood Pressure (Exit) 122/64 -- 100/60     Heart Rate (Admit) 68 bpm -- 54 bpm     Heart Rate (Exercise) 102 bpm -- 115 bpm     Heart Rate (Exit) 64 bpm -- 66 bpm     Rating of Perceived Exertion (Exercise) 13 -- 15     Symptoms none -- none     Duration Continue with 30 min of aerobic exercise without signs/symptoms of physical distress. -- Continue with 30 min of aerobic exercise without signs/symptoms of physical distress.     Intensity THRR unchanged -- THRR unchanged       Progression   Progression Continue to progress workloads to maintain intensity without signs/symptoms of physical distress. -- Continue to progress workloads to maintain intensity without signs/symptoms of physical distress.     Average METs 5.56 -- 8.29       Resistance Training   Training Prescription Yes -- Yes     Weight 6 lb -- 6 lb     Reps 10-15 -- 10-15       Interval Training   Interval Training Yes -- Yes     Equipment Treadmill -- Treadmill     Comments Incline intervals up to 15% -- Incline intervals up to 15%       Treadmill   MPH 3 -- 2.8     Grade 10 -- 15     Minutes 15 -- 15     METs 7.4 -- 8.93       Recumbant Bike   Level 12 -- 8     Watts 72 -- 60     Minutes 15 -- 15     METs 4.2 -- --       NuStep   Level 8 -- 7     Minutes 15 -- 15  Elliptical   Level -- -- 9     Speed -- -- 4.3     Minutes -- -- 15       Home Exercise Plan   Plans to continue exercise at -- Home (comment)  walking, yard work, [employer] (comment)   walking, yard work, [employer] -- Add 2 additional days to program exercise sessions. Add 2 additional days to program exercise sessions.     Initial Home Exercises Provided -- 10/31/22 10/31/22       Oxygen   Maintain Oxygen Saturation 88% or higher -- 88% or higher              Exercise Comments:   Exercise Comments     Row Name 08/25/22 1025           Exercise Comments First full day of exercise!  Patient was oriented to gym and equipment including functions, settings, policies, and procedures.  Patient's individual exercise prescription and treatment plan were reviewed.  All starting workloads were established based on the results of the 6 minute walk test done at initial orientation visit.  The plan for exercise progression was also introduced and progression will be customized based on patient's performance and goals.                Exercise Goals and Review:   Exercise Goals     Row Name 08/21/22 1539             Exercise Goals   Increase Physical Activity Yes       Intervention Provide advice, education, support and counseling about physical activity/exercise needs.;Develop an individualized exercise prescription for aerobic and resistive training based on initial evaluation findings, risk stratification, comorbidities and participant's personal goals.       Expected Outcomes Short Term: Attend rehab on a regular basis to increase amount of physical activity.;Long Term: Add in home exercise to make exercise part of routine and to increase amount of physical activity.;Long Term: Exercising regularly at least 3-5 days a week.       Increase Strength and Stamina Yes       Intervention Provide advice, education, support and counseling about physical activity/exercise needs.;Develop an individualized exercise prescription for aerobic and resistive training based on initial evaluation findings, risk stratification, comorbidities and participant's  personal goals.       Expected Outcomes Long Term: Improve cardiorespiratory fitness, muscular endurance and strength as measured by increased METs and functional capacity (6MWT);Short Term: Increase workloads from initial exercise prescription for resistance, speed, and METs.;Short Term: Perform resistance training exercises routinely during rehab and add in resistance training at home       Able to understand and use rate of perceived exertion (RPE) scale Yes       Intervention Provide education and explanation on how to use RPE scale       Expected Outcomes Short Term: Able to use RPE daily in rehab to express subjective intensity level;Long Term:  Able to use RPE to guide intensity level when exercising independently       Able to understand and use Dyspnea scale Yes       Intervention Provide education and explanation on how to use Dyspnea scale       Expected Outcomes Short Term: Able to use Dyspnea scale daily in rehab to express subjective sense of shortness of breath during exertion;Long Term: Able to use Dyspnea scale to guide intensity level when exercising  independently       Knowledge and understanding of Target Heart Rate Range (THRR) Yes       Intervention Provide education and explanation of THRR including how the numbers were predicted and where they are located for reference       Expected Outcomes Short Term: Able to state/look up THRR;Long Term: Able to use THRR to govern intensity when exercising independently;Short Term: Able to use daily as guideline for intensity in rehab       Able to check pulse independently Yes       Intervention Provide education and demonstration on how to check pulse in carotid and radial arteries.;Review the importance of being able to check your own pulse for safety during independent exercise       Expected Outcomes Short Term: Able to explain why pulse checking is important during independent exercise;Long Term: Able to check pulse independently and  accurately       Understanding of Exercise Prescription Yes       Intervention Provide education, explanation, and written materials on patient's individual exercise prescription       Expected Outcomes Short Term: Able to explain program exercise prescription;Long Term: Able to explain home exercise prescription to exercise independently                Exercise Goals Re-Evaluation :  Exercise Goals Re-Evaluation     Row Name 08/25/22 1025 09/02/22 1625 09/17/22 1623 10/01/22 1215 10/15/22 1556     Exercise Goal Re-Evaluation   Exercise Goals Review Able to understand and use rate of perceived exertion (RPE) scale;Able to understand and use Dyspnea scale;Knowledge and understanding of Target Heart Rate Range (THRR);Understanding of Exercise Prescription Increase Physical Activity;Increase Strength and Stamina;Understanding of Exercise Prescription Increase Physical Activity;Increase Strength and Stamina;Understanding of Exercise Prescription Increase Physical Activity;Increase Strength and Stamina;Understanding of Exercise Prescription Increase Physical Activity;Increase Strength and Stamina;Understanding of Exercise Prescription   Comments Reviewed RPE scale, THR and program prescription with pt today.  Pt voiced understanding and was given a copy of goals to take home. Fern is doing well for his first couple of sessions he has been at rehab. He already increased his workload on the treadmill to 2.4 mph/ 1% incline. He is also now working at level 3 on the recumbent bike results in 20 watts, hoping to see that go up overtime. We will continue to monitor as he progresses. Latavius continues to do well in rehab. He recently increased his overall average MET level to 3.99 METs. He also improved to level 5 on the recumbent bike. Aymen has begun to do interval training on the treadmill as well at a speed of 3 mph with an incline ranging from 4-10%. He began using the elliptical and was able to tolerate  level 1 for 10 minutes. We will continue to monitor his progress in the program. Evonte continues to do well in rehab. He substantially increased to level 12 on the bike working up to 76 watts! He also increased significantly to level 13 on the XR. RPEs were in appropriate range. He continues to hit his THR each session. Will continue to monitor. Tlaloc continues to do well in rehab. He has continued to do well on the elliptical as he was able to improve from level 1 to level 3. He also has consistently done well with intervals of incline on the treadmill ranging from 5-15%. We will continue to monitor his progress in the program.   Expected Outcomes Short: Use  RPE daily to regulate intensity. Long: Follow program prescription in THR. Short: Increase watts on recumbent bike, follow exercise prescription Long: Build up overall strength and stamina Short: Continue to increase time on the elliptical. Long: Continue to increase strength and stamina. Short: Increase workload and duration on elliptical Long: Continue to increase overall MET level Short: Continue interval training on the treadmill. Long: Continue to improve strength and stamina.    [place] Name 10/29/22 1320 10/31/22 0937 11/13/22 1435         Exercise Goal Re-Evaluation   Exercise Goals Review Increase Physical Activity;Increase Strength and Stamina;Understanding of Exercise Prescription Increase Physical Activity;Increase Strength and Stamina;Understanding of Exercise Prescription Increase Physical Activity;Increase Strength and Stamina;Understanding of Exercise Prescription     Comments Fredick is doing well in rehab. He continues exercising on the treadmill with intervals, varying the incline up to 15%. He also increased to level 8 on the T4 Nustep. He continues to reach his THR each session. Will continue to monitor. Strummer is doing well in rehab.  He has been enjoying coming to to rehab to use the equipment.  He has plans to join [employer] in  [place] for after rehab.  He has noticed that his stamina is getting better and he is less wobbley now and has more strength. He has ordered some hand weights to use at home.  He is staying active each day by working out in the yard. Reviewed home exercise with pt today.  Pt plans to walk and work in the yard at home for exercise.  Reviewed THR, pulse, RPE, sign and symptoms, pulse oximetery and when to call 911 or MD.  Also discussed weather considerations and indoor options.  Pt voiced understanding. Kashaun continues to do well in rehab. He recently increased his level on the elliptical to level 9 with a speed of 4.3 mph. He also has continued to walk on the treadmill with intervals of incline up to 15%. We will continue to monitor his progress in the program.     Expected Outcomes Short: Continue reaching THR with increased workloads Long: Continue to increase overall MET level Short: continue to work toward getting to [place]: conitnue to improve stamina Short: Continue to do interval work on the treadmill. Long: Continue to improve strength and stamina.              Discharge Exercise Prescription (Final Exercise Prescription Changes):  Exercise Prescription Changes - 11/13/22 1400       Response to Exercise   Blood Pressure (Admit) 102/58    Blood Pressure (Exit) 100/60    Heart Rate (Admit) 54 bpm    Heart Rate (Exercise) 115 bpm    Heart Rate (Exit) 66 bpm    Rating of Perceived Exertion (Exercise) 15    Symptoms none    Duration Continue with 30 min of aerobic exercise without signs/symptoms of physical distress.    Intensity THRR unchanged      Progression   Progression Continue to progress workloads to maintain intensity without signs/symptoms of physical distress.    Average METs 8.29      Resistance Training   Training Prescription Yes    Weight 6 lb    Reps 10-15      Interval Training   Interval Training Yes    Equipment Treadmill    Comments Incline  intervals up to 15%      Treadmill   MPH 2.8    Grade 15  Minutes 15    METs 8.93      Recumbant Bike   Level 8    Watts 60    Minutes 15      NuStep   Level 7    Minutes 15      Elliptical   Level 9    Speed 4.3    Minutes 15      Home Exercise Plan   Plans to continue exercise at Home (comment)   walking, yard work, [employer] Add 2 additional days to program exercise sessions.    Initial Home Exercises Provided 10/31/22      Oxygen   Maintain Oxygen Saturation 88% or higher             Nutrition:  Target Goals: Understanding of nutrition guidelines, daily intake of sodium '1500mg'$ , cholesterol '200mg'$ , calories 30% from fat and 7% or less from saturated fats, daily to have 5 or more servings of fruits and vegetables.  Education: All About Nutrition: -Group instruction provided by verbal, written material, interactive activities, discussions, models, and posters to present general guidelines for heart healthy nutrition including fat, fiber, MyPlate, the role of sodium in heart healthy nutrition, utilization of the nutrition label, and utilization of this knowledge for meal planning. Follow up email sent as well. Written material given at graduation. Flowsheet Row Cardiac Rehab from 09/03/2022 in [facility] Cardiac and Pulmonary Rehab  Date 09/03/22  Educator [facility]  Instruction Review Code 1- Verbalizes Understanding       Biometrics:  Pre Biometrics - 08/21/22 1540       Pre Biometrics   Height 6' 2.5" (1.892 m)    Weight 221 lb 1.6 oz (100.3 kg)    Waist Circumference 38 inches    Hip Circumference 43 inches    Waist to Hip Ratio 0.88 %    BMI (Calculated) 28.02    Single Leg Stand 16.1 seconds   R            Post Biometrics - 11/10/22 1013        Post  Biometrics   Height 6' 2.5" (1.892 m)    Weight 229 lb 6.4 oz (104.1 kg)    Waist Circumference 38 inches    Hip Circumference 44 inches    Waist to Hip Ratio 0.86 %    BMI  (Calculated) 29.07    Single Leg Stand 30 seconds             Nutrition Therapy Plan and Nutrition Goals:  Nutrition Therapy & Goals - 08/21/22 1050       Nutrition Therapy   Diet Heart healthy, low Na    Drug/Food Interactions Statins/Certain Fruits    Protein (specify units) 80-90g    Fiber 30 grams    Whole Grain Foods 3 servings    Saturated Fats 16 max. grams    Fruits and Vegetables 8 servings/day    Sodium 2 grams      Personal Nutrition Goals   Nutrition Goal ST: soymilk instead of almond milk, vegetarian protein to help add protein to meatless meals LT: practice MyPlate guidelines, meet protein needs    Comments 72 y.o. M admitted to cardiac rehab s/p stent. PMHx includes A.fib, CAD, OSA, prostate cancer, HLD. Relevant medications includes lipitor, vit B12, MVI w/ minerals. B: he does not like to eat in am, he likes coffee (some cream and stevia) L: smoothies with fruit (banana and berries) and cottage cheese or greek yogurt as  well as almond milk and ground flaxseed. D: big baked potato with stuff on it, sometimes daughter will give him food like roast beef which he will add some vegetables to, he likes eggs with whole wheat toast with sometimes some deli meat, minestrone soup, potato leek soup. he reports he will sometimes snack on some chips as well as nuts like walnuts. Drinks: water. Discussed general heart healthy eating and protein needs; recommended soymilk for smoothies instead of almond milk and vegetarian protein to meals without meat/chicken/fish such as greek yogurt, beans/lentils, eggs (also encouraged foods that increase protein moderately such as whole grains).      Intervention Plan   Intervention Prescribe, educate and counsel regarding individualized specific dietary modifications aiming towards targeted core components such as weight, hypertension, lipid management, diabetes, heart failure and other comorbidities.;Nutrition handout(s) given to patient.     Expected Outcomes Short Term Goal: Understand basic principles of dietary content, such as calories, fat, sodium, cholesterol and nutrients.;Short Term Goal: A plan has been developed with personal nutrition goals set during dietitian appointment.;Long Term Goal: Adherence to prescribed nutrition plan.             Nutrition Assessments:  MEDIFICTS Score Key: ?70 Need to make dietary changes  40-70 Heart Healthy Diet ? 40 Therapeutic Level Cholesterol Diet  Flowsheet Row Cardiac Rehab from 11/12/2022 in [facility] Cardiac and Pulmonary Rehab  Picture Your Plate Total Score on Discharge 65      Picture Your Plate Scores: <28 Unhealthy dietary pattern with much room for improvement. 41-50 Dietary pattern unlikely to meet recommendations for good health and room for improvement. 51-60 More healthful dietary pattern, with some room for improvement.  >60 Healthy dietary pattern, although there may be some specific behaviors that could be improved.    Nutrition Goals Re-Evaluation:  Nutrition Goals Re-Evaluation     [place] Name 10/08/22 1000 10/31/22 0940           Goals   Current Weight 227 lb (103 kg) --      Nutrition Goal Try to lose some weight. Short: check labels for sodium content. Long: maintain a reduced sodium diet.      Comment Earnestine wants to be aroung 215 pounds. He sometimes has bouts of fluid build up in his legs. He does not add any salt to his food but does not look at the content of whats in the food. Tylyn is doing well in rehab.Marland Kitchen  He is reading the food labels and it just emphasized what he was already doing.  He tends to eat mostly fresh foods overall. He continues to keep eye on sodium levels.      Expected Outcome Short: check labels for sodium content. Long: maintain a reduced sodium diet. Short: conitnue to watch portion sizes Long: Continue to eat heart healthy               Nutrition Goals Discharge (Final Nutrition Goals Re-Evaluation):  Nutrition Goals  Re-Evaluation - 10/31/22 0940       Goals   Nutrition Goal Short: check labels for sodium content. Long: maintain a reduced sodium diet.    Comment Gehrig is doing well in rehab.Marland Kitchen  He is reading the food labels and it just emphasized what he was already doing.  He tends to eat mostly fresh foods overall. He continues to keep eye on sodium levels.    Expected Outcome Short: conitnue to watch portion sizes Long: Continue to eat heart healthy  Psychosocial: Target Goals: Acknowledge presence or absence of significant depression and/or stress, maximize coping skills, provide positive support system. Participant is able to verbalize types and ability to use techniques and skills needed for reducing stress and depression.   Education: Stress, Anxiety, and Depression - Group verbal and visual presentation to define topics covered.  Reviews how body is impacted by stress, anxiety, and depression.  Also discusses healthy ways to reduce stress and to treat/manage anxiety and depression.  Written material given at graduation.   Education: Sleep Hygiene -Provides group verbal and written instruction about how sleep can affect your health.  Define sleep hygiene, discuss sleep cycles and impact of sleep habits. Review good sleep hygiene tips.    Initial Review & Psychosocial Screening:  Initial Psych Review & Screening - 08/13/22 1347       Initial Review   Current issues with None Identified      Family Dynamics   Good Support System? Yes    Comments He can look to his daughter for support. He lives alone with his daughter that lives close to him. Zacharias has no dression or anxiety issues.      Barriers   Psychosocial barriers to participate in program The patient should benefit from training in stress management and relaxation.      Screening Interventions   Interventions Encouraged to exercise;To provide support and resources with identified psychosocial needs;Provide feedback  about the scores to participant    Expected Outcomes Short Term goal: Utilizing psychosocial counselor, staff and physician to assist with identification of specific Stressors or current issues interfering with healing process. Setting desired goal for each stressor or current issue identified.;Long Term Goal: Stressors or current issues are controlled or eliminated.;Short Term goal: Identification and review with participant of any Quality of Life or Depression concerns found by scoring the questionnaire.;Long Term goal: The participant improves quality of Life and PHQ9 Scores as seen by post scores and/or verbalization of changes             Quality of Life Scores:   Quality of Life - 11/12/22 0840       Quality of Life Scores   Health/Function Pre 27.37 %    Health/Function Post 28.4 %    Health/Function % Change 3.76 %    Socioeconomic Pre 30 %    Socioeconomic Post 30 %    Socioeconomic % Change  0 %    Psych/Spiritual Pre 27.93 %    Psych/Spiritual Post 25.71 %    Psych/Spiritual % Change -7.95 %    Family Pre 27.8 %    Family Post 30 %    Family % Change 7.91 %    GLOBAL Pre 28.09 %    GLOBAL Post 28.31 %    GLOBAL % Change 0.78 %            Scores of 19 and below usually indicate a poorer quality of life in these areas.  A difference of  2-3 points is a clinically meaningful difference.  A difference of 2-3 points in the total score of the Quality of Life Index has been associated with significant improvement in overall quality of life, self-image, physical symptoms, and general health in studies assessing change in quality of life.  PHQ-9: Review Flowsheet       11/12/2022 08/21/2022  Depression screen PHQ 2/9  Decreased Interest 0 0  Down, Depressed, Hopeless 0 0  PHQ - 2 Score 0 0  Altered sleeping 0 0  Tired, decreased energy 1 0  Change in appetite 0 0  Feeling bad or failure about yourself  0 0  Trouble concentrating 0 0  Moving slowly or  fidgety/restless 0 0  Suicidal thoughts 0 0  PHQ-9 Score 1 0  Difficult doing work/chores Not difficult at all Not difficult at all   Interpretation of Total Score  Total Score Depression Severity:  1-4 = Minimal depression, 5-9 = Mild depression, 10-14 = Moderate depression, 15-19 = Moderately severe depression, 20-27 = Severe depression   Psychosocial Evaluation and Intervention:  Psychosocial Evaluation - 08/13/22 1349       Psychosocial Evaluation & Interventions   Interventions Encouraged to exercise with the program and follow exercise prescription;Relaxation education;Stress management education    Comments He can look to his daughter for support. He lives alone with his daughter that lives close to him. Matthias has no dression or anxiety issues.    Expected Outcomes Short: Start [facility] to help with mood. Long: Maintain a healthy mental state    Continue Psychosocial Services  Follow up required by staff             Psychosocial Re-Evaluation:  Psychosocial Re-Evaluation     [place] Name 09/03/22 1010 10/08/22 0959 10/31/22 0939         Psychosocial Re-Evaluation   Current issues with None Identified None Identified None Identified     Comments Torrian reports no stress concerns. He mentioned he sleeps with his CPAP and usually sleeps 8 hours. Today he woke up still feeling tired and thinks it may be the change in weather or his metoprolol. He is happy coming here and can already tell a difference in his stamina. He wants to use his sessions for exercise and will decide which education topics he will attend. Patient reports no issues with their current mental states, sleep, stress, depression or anxiety. Will follow up with patient in a few weeks for any changes. Dontrae is doing well in rehab.  He is feeling good mentally and denies any major stressors.  He sleeps well for most part, but he does have some nights where does not sleep as well.  He does wear his CPAP each night.      Expected Outcomes Short: attend cardiac rehab for exercise and education. Long: maintain positive self care habits. Short: Continue to exercise regularly to support mental health and notify staff of any changes. Long: maintain mental health and well being through teaching of rehab or prescribed medications independently. short: Conitnued CPAP compliance Long: Continue to stay postivie     Interventions -- Encouraged to attend Cardiac Rehabilitation for the exercise Encouraged to attend Cardiac Rehabilitation for the exercise     Continue Psychosocial Services  Follow up required by staff Follow up required by staff Follow up required by staff              Psychosocial Discharge (Final Psychosocial Re-Evaluation):  Psychosocial Re-Evaluation - 10/31/22 0939       Psychosocial Re-Evaluation   Current issues with None Identified    Comments Kee is doing well in rehab.  He is feeling good mentally and denies any major stressors.  He sleeps well for most part, but he does have some nights where does not sleep as well.  He does wear his CPAP each night.    Expected Outcomes short: Conitnued CPAP compliance Long: Continue to stay postivie    Interventions Encouraged to attend Cardiac Rehabilitation for the exercise  Continue Psychosocial Services  Follow up required by staff             Vocational Rehabilitation: Provide vocational rehab assistance to qualifying candidates.   Vocational Rehab Evaluation & Intervention:  Vocational Rehab - 09/03/22 1010       Initial Vocational Rehab Evaluation & Intervention   Assessment shows need for Vocational Rehabilitation No             Education: Education Goals: Education classes will be provided on a variety of topics geared toward better understanding of heart health and risk factor modification. Participant will state understanding/return demonstration of topics presented as noted by education test scores.  Learning  Barriers/Preferences:  Learning Barriers/Preferences - 08/13/22 1342       Learning Barriers/Preferences   Learning Barriers None    Learning Preferences None             General Cardiac Education Topics:  AED/CPR: - Group verbal and written instruction with the use of models to demonstrate the basic use of the AED with the basic ABC's of resuscitation.   Anatomy and Cardiac Procedures: - Group verbal and visual presentation and models provide information about basic cardiac anatomy and function. Reviews the testing methods done to diagnose heart disease and the outcomes of the test results. Describes the treatment choices: Medical Management, Angioplasty, or Coronary Bypass Surgery for treating various heart conditions including Myocardial Infarction, Angina, Valve Disease, and Cardiac Arrhythmias.  Written material given at graduation. Flowsheet Row Cardiac Rehab from 09/03/2022 in [facility] Cardiac and Pulmonary Rehab  Education need identified 08/21/22       Medication Safety: - Group verbal and visual instruction to review commonly prescribed medications for heart and lung disease. Reviews the medication, class of the drug, and side effects. Includes the steps to properly store meds and maintain the prescription regimen.  Written material given at graduation.   Intimacy: - Group verbal instruction through game format to discuss how heart and lung disease can affect sexual intimacy. Written material given at graduation.. Flowsheet Row Cardiac Rehab from 09/03/2022 in [facility] Cardiac and Pulmonary Rehab  Date 08/27/22  Educator [facility]  Instruction Review Code 1- Verbalizes Understanding       Know Your Numbers and Heart Failure: - Group verbal and visual instruction to discuss disease risk factors for cardiac and pulmonary disease and treatment options.  Reviews associated critical values for Overweight/Obesity, Hypertension, Cholesterol, and Diabetes.  Discusses basics of heart  failure: signs/symptoms and treatments.  Introduces Heart Failure Zone chart for action plan for heart failure.  Written material given at graduation.   Infection Prevention: - Provides verbal and written material to individual with discussion of infection control including proper hand washing and proper equipment cleaning during exercise session. Flowsheet Row Cardiac Rehab from 09/03/2022 in [facility] Cardiac and Pulmonary Rehab  Date 08/13/22  Educator [facility]  Instruction Review Code 1- Verbalizes Understanding       Falls Prevention: - Provides verbal and written material to individual with discussion of falls prevention and safety. Flowsheet Row Cardiac Rehab from 09/03/2022 in [facility] Cardiac and Pulmonary Rehab  Date 08/13/22  Educator [facility]  Instruction Review Code 1- Verbalizes Understanding       Other: -Provides group and verbal instruction on various topics (see comments)   Knowledge Questionnaire Score:  Knowledge Questionnaire Score - 11/12/22 0836       Knowledge Questionnaire Score   Pre Score 23/26    Post Score 24/26  Core Components/Risk Factors/Patient Goals at Admission:  Personal Goals and Risk Factors at Admission - 08/13/22 1342       Core Components/Risk Factors/Patient Goals on Admission    Weight Management Yes;Weight Loss    Intervention Weight Management: Develop a combined nutrition and exercise program designed to reach desired caloric intake, while maintaining appropriate intake of nutrient and fiber, sodium and fats, and appropriate energy expenditure required for the weight goal.;Weight Management: Provide education and appropriate resources to help participant work on and attain dietary goals.;Weight Management/Obesity: Establish reasonable short term and long term weight goals.    Expected Outcomes Short Term: Continue to assess and modify interventions until short term weight is achieved;Long Term: Adherence to nutrition and physical  activity/exercise program aimed toward attainment of established weight goal;Weight Loss: Understanding of general recommendations for a balanced deficit meal plan, which promotes 1-2 lb weight loss per week and includes a negative energy balance of [phone] kcal/d;Understanding recommendations for meals to include 15-35% energy as protein, 25-35% energy from fat, 35-60% energy from carbohydrates, less than '200mg'$  of dietary cholesterol, 20-35 gm of total fiber daily;Understanding of distribution of calorie intake throughout the day with the consumption of 4-5 meals/snacks    Lipids Yes    Intervention Provide education and support for participant on nutrition & aerobic/resistive exercise along with prescribed medications to achieve LDL '70mg'$ , HDL >'40mg'$ .    Expected Outcomes Short Term: Participant states understanding of desired cholesterol values and is compliant with medications prescribed. Participant is following exercise prescription and nutrition guidelines.;Long Term: Cholesterol controlled with medications as prescribed, with individualized exercise RX and with personalized nutrition plan. Value goals: LDL < '70mg'$ , HDL > 40 mg.             Education:Diabetes - Individual verbal and written instruction to review signs/symptoms of diabetes, desired ranges of glucose level fasting, after meals and with exercise. Acknowledge that pre and post exercise glucose checks will be done for 3 sessions at entry of program.   Core Components/Risk Factors/Patient Goals Review:   Goals and Risk Factor Review     Row Name 09/03/22 1006 10/08/22 1004 10/31/22 0942         Core Components/Risk Factors/Patient Goals Review   Personal Goals Review Weight Management/Obesity;Lipids Weight Management/Obesity;Hypertension Weight Management/Obesity;Hypertension     Review Hurman has been enjoying coming to cardiac rehab. He has noticed that his legs are feeling stronger already. He is planning on attending all  his exercise sessions to help with his weight management. He also is been slowly implementing the nutritional changes. He reports no issues with blood pressure. He is taking his medication as prescribed. Ordell states his blood pressure has been doing well. He has not been checking at home as much but he does monitor his oxygen and HR. Informed him to check his blood pressure at times at home to make sure his reading correlate to ours. Lautaro is doing well in rehab.  He is holding steady for most part with his weight, if he goes up, it will come back down.  His pressures are doing well and he continues to check them at home.     Expected Outcomes Short: attend cardiac rehab sessions to help with weight management. Long: indpendently manage risk factors Short: check blood pressure at home. Long: maintain blood pressure readings independently. Short: Continue to keep eye on weight Long: conitnue to montior risk factors.              Core Components/Risk  Factors/Patient Goals at Discharge (Final Review):   Goals and Risk Factor Review - 10/31/22 0942       Core Components/Risk Factors/Patient Goals Review   Personal Goals Review Weight Management/Obesity;Hypertension    Review Roddy is doing well in rehab.  He is holding steady for most part with his weight, if he goes up, it will come back down.  His pressures are doing well and he continues to check them at home.    Expected Outcomes Short: Continue to keep eye on weight Long: conitnue to montior risk factors.             ITP Comments:  ITP Comments     Row Name 08/13/22 1341 08/21/22 1537 08/25/22 1025 09/17/22 0840 10/15/22 1103   ITP Comments Virtual Visit completed. Patient informed on EP and RD appointment and 6 Minute walk test. Patient also informed of patient health questionnaires on My Chart. Patient Verbalizes understanding. Visit diagnosis can be found in [facility] 07/31/22. Completed 6MWT and gym orientation. Initial ITP created and  sent for review to Dr. Emily Filbert, Medical Director. First full day of exercise!  Patient was oriented to gym and equipment including functions, settings, policies, and procedures.  Patient's individual exercise prescription and treatment plan were reviewed.  All starting workloads were established based on the results of the 6 minute walk test done at initial orientation visit.  The plan for exercise progression was also introduced and progression will be customized based on patient's performance and goals. 30 Day review completed. Medical Director ITP review done, changes made as directed, and signed approval by Medical Director.   new to program 30 Day review completed. Medical Director ITP review done, changes made as directed, and signed approval by Medical Director.    [place] Name 11/12/22 1035 11/24/22 1027         ITP Comments 30 Day review completed. Medical Director ITP review done, changes made as directed, and signed approval by Medical Director. Jayde graduated today from  rehab with 36 sessions completed.  Details of the patient's exercise prescription and what He needs to do in order to continue the prescription and progress were discussed with patient.  Patient was given a copy of prescription and goals.  Patient verbalized understanding.  Alejos plans to continue to exercise by walking, yard work, and [employer].               Comments: Discharge ITP

## 2022-11-24 NOTE — Progress Notes (Signed)
Daily Session Note  Patient Details  Name: WARDEN BUFFA MRN: [id number] Date of Birth: 11-Apr-1951 Referring Provider:   Flowsheet Row Cardiac Rehab from 08/21/2022 in [facility] Cardiac and Pulmonary Rehab  Referring Provider Isaias Cowman MD       Encounter Date: 11/24/2022  Check In:  Session Check In - 11/24/22 1026       Check-In   Supervising physician immediately available to respond to emergencies See telemetry face sheet for immediately available ER MD    Location [facility]-Cardiac & Pulmonary Rehab    Staff Present Darlyne Russian, RN, Doyce Para, BS, ACSM CEP, Exercise Physiologist;Noah Tickle, BS, Exercise Physiologist    Virtual Visit No    Medication changes reported     No    Fall or balance concerns reported    No    Warm-up and Cool-down Performed on first and last piece of equipment    Resistance Training Performed Yes    VAD Patient? No    PAD/SET Patient? No      Pain Assessment   Currently in Pain? No/denies                Social History   Tobacco Use  Smoking Status Former   Packs/day: 1.00   Years: 11.00   Total pack years: 11.00   Types: Cigarettes   Quit date: 04/10/1976   Years since quitting: 46.6  Smokeless Tobacco Never    Goals Met:  Independence with exercise equipment Exercise tolerated well No report of concerns or symptoms today Strength training completed today  Goals Unmet:  Not Applicable  Comments:  Zaelyn graduated today from  rehab with 36 sessions completed.  Details of the patient's exercise prescription and what He needs to do in order to continue the prescription and progress were discussed with patient.  Patient was given a copy of prescription and goals.  Patient verbalized understanding.  Dajour plans to continue to exercise by walking, yard work, and [employer].    Dr. Emily Filbert is Medical Director for [place].  Dr. Ottie Glazier is Medical Director for [facility] Pulmonary  Rehabilitation.

## 2022-11-24 NOTE — Progress Notes (Signed)
Discharge Summary: Joshua Barajas (DOB: 12/05/1950)  Amontae graduated today from  rehab with 36 sessions completed.  Details of the patient's exercise prescription and what He needs to do in order to continue the prescription and progress were discussed with patient.  Patient was given a copy of prescription and goals.  Patient verbalized understanding.  Rihaan plans to continue to exercise by walking, yard work, and [employer].   [place] Name 08/21/22 1538 11/10/22 1009       6 Minute Walk   Phase Initial Discharge    Distance 1355 feet 1350 feet    Distance % Change -- -0.3 %    Distance Feet Change -- -5 ft    Walk Time 6 minutes 6 minutes    # of Rest Breaks 0 0    MPH 2.57 2.56    METS 3.02 2.67    RPE 11 11    Perceived Dyspnea  1 0    VO2 Peak 10.58 9.35    Symptoms Yes (comment) No    Comments Hip Tightness --    Resting HR 48 bpm 49 bpm    Resting BP 112/58 110/62    Resting Oxygen Saturation  100 % 98 %    Exercise Oxygen Saturation  during 6 min walk 94 % 97 %    Max Ex. HR 99 bpm 62 bpm    Max Ex. BP 126/68 142/82    2 Minute Post BP 114/58 --

## 2023-03-17 DIAGNOSIS — R0602 Shortness of breath: Secondary | ICD-10-CM | POA: Diagnosis not present

## 2023-03-17 DIAGNOSIS — I480 Paroxysmal atrial fibrillation: Secondary | ICD-10-CM | POA: Diagnosis not present

## 2023-03-17 DIAGNOSIS — E7801 Familial hypercholesterolemia: Secondary | ICD-10-CM | POA: Diagnosis not present

## 2023-03-17 DIAGNOSIS — Z955 Presence of coronary angioplasty implant and graft: Secondary | ICD-10-CM | POA: Diagnosis not present

## 2023-03-17 DIAGNOSIS — G4733 Obstructive sleep apnea (adult) (pediatric): Secondary | ICD-10-CM | POA: Diagnosis not present

## 2023-03-31 DIAGNOSIS — D2272 Melanocytic nevi of left lower limb, including hip: Secondary | ICD-10-CM | POA: Diagnosis not present

## 2023-03-31 DIAGNOSIS — Z85828 Personal history of other malignant neoplasm of skin: Secondary | ICD-10-CM | POA: Diagnosis not present

## 2023-03-31 DIAGNOSIS — D225 Melanocytic nevi of trunk: Secondary | ICD-10-CM | POA: Diagnosis not present

## 2023-03-31 DIAGNOSIS — D2261 Melanocytic nevi of right upper limb, including shoulder: Secondary | ICD-10-CM | POA: Diagnosis not present

## 2023-03-31 DIAGNOSIS — Z08 Encounter for follow-up examination after completed treatment for malignant neoplasm: Secondary | ICD-10-CM | POA: Diagnosis not present

## 2023-03-31 DIAGNOSIS — L570 Actinic keratosis: Secondary | ICD-10-CM | POA: Diagnosis not present

## 2023-03-31 DIAGNOSIS — D2262 Melanocytic nevi of left upper limb, including shoulder: Secondary | ICD-10-CM | POA: Diagnosis not present

## 2023-06-30 DIAGNOSIS — R42 Dizziness and giddiness: Secondary | ICD-10-CM | POA: Diagnosis not present

## 2023-06-30 DIAGNOSIS — I10 Essential (primary) hypertension: Secondary | ICD-10-CM | POA: Diagnosis not present

## 2023-08-03 ENCOUNTER — Ambulatory Visit (INDEPENDENT_AMBULATORY_CARE_PROVIDER_SITE_OTHER): Payer: PPO | Admitting: Internal Medicine

## 2023-08-03 VITALS — BP 114/64 | HR 53 | Resp 14 | Ht 75.0 in | Wt 230.0 lb

## 2023-08-03 DIAGNOSIS — Z7189 Other specified counseling: Secondary | ICD-10-CM

## 2023-08-03 DIAGNOSIS — G4733 Obstructive sleep apnea (adult) (pediatric): Secondary | ICD-10-CM | POA: Diagnosis not present

## 2023-08-03 DIAGNOSIS — I4891 Unspecified atrial fibrillation: Secondary | ICD-10-CM | POA: Diagnosis not present

## 2023-08-03 NOTE — Patient Instructions (Signed)

## 2023-08-03 NOTE — Progress Notes (Unsigned)
[facility] [address] [place], [place] [address]  Pulmonary Sleep Medicine   Office Visit Note  Patient Name: Joshua Barajas DOB: 09/30/1951 MRN [id number]    Chief Complaint: Obstructive Sleep Apnea visit  Brief History:  Joshua Barajas is seen today for an annual follow up on CPAP@9cmh20 . The patient has a 4 year history of sleep apnea. Patient is using PAP nightly.  The patient feels rested after sleeping with PAP.  The patient reports benefiting from PAP use. Reported sleepiness is  improved and the Epworth Sleepiness Score is 3 out of 24. The patient does take naps. The patient complains of the following: No complaints.  The compliance download shows 97% compliance with an average use time of 7.5 hours. The AHI is 1.4  The patient does not complain of limb movements disrupting sleep.  ROS  General: (-) fever, (-) chills, (-) night sweat Nose and Sinuses: (-) nasal stuffiness or itchiness, (-) postnasal drip, (-) nosebleeds, (-) sinus trouble. Mouth and Throat: (-) sore throat, (-) hoarseness. Neck: (-) swollen glands, (-) enlarged thyroid, (-) neck pain. Respiratory: - cough, - shortness of breath, - wheezing. Neurologic: - numbness, - tingling. Psychiatric: - anxiety, - depression   Current Medication: Outpatient Encounter Medications as of 08/03/2023  Medication Sig   apixaban (ELIQUIS) 5 MG TABS tablet Take 1 tablet (5 mg total) by mouth 2 (two) times daily.   aspirin EC 81 MG tablet Take 81 mg by mouth daily. Swallow whole.   atorvastatin (LIPITOR) 80 MG tablet Take by mouth.   cholecalciferol (VITAMIN D3) 25 MCG (1000 UNIT) tablet Take 1,000 Units by mouth daily.   clopidogrel (PLAVIX) 75 MG tablet Take 1 tablet (75 mg) by mouth daily.   cyanocobalamin (VITAMIN B12) 250 MCG tablet Take 250 mcg by mouth 2 (two) times a week.   metoprolol tartrate (LOPRESSOR) 25 MG tablet Take 1 tablet (25 mg total) by mouth 2 (two) times daily.   Multiple Vitamin  (MULTIVITAMIN) tablet Take 1 tablet by mouth daily.   vitamin k 100 MCG tablet Take 100 mcg by mouth daily.   [DISCONTINUED] atorvastatin (LIPITOR) 80 MG tablet Take 1 tablet (80 mg total) by mouth every evening.   [DISCONTINUED] ticagrelor (BRILINTA) 90 MG TABS tablet Take 1 tablet (90 mg total) by mouth 2 (two) times daily.   No facility-administered encounter medications on file as of 08/03/2023.    Surgical History: Past Surgical History:  Procedure Laterality Date   CATARACT EXTRACTION Bilateral    CLOSED REDUCTION NASAL FRACTURE N/A 03/12/2021   Procedure: CLOSED REDUCTION NASAL FRACTURE;  Surgeon: Geanie Logan, MD;  Location: [facility];  Service: ENT;  Laterality: N/A;  sleep apnea   COLONOSCOPY     COLONOSCOPY WITH PROPOFOL N/A 09/28/2020   Procedure: COLONOSCOPY WITH PROPOFOL;  Surgeon: Regis Bill, MD;  Location: [facility];  Service: Endoscopy;  Laterality: N/A;   CORONARY STENT INTERVENTION N/A 07/30/2022   Procedure: CORONARY STENT INTERVENTION;  Surgeon: Marcina Millard, MD;  Location: [facility];  Service: Cardiovascular;  Laterality: N/A;   EYE SURGERY     LEFT HEART CATH AND CORONARY ANGIOGRAPHY N/A 07/30/2022   Procedure: LEFT HEART CATH AND CORONARY ANGIOGRAPHY;  Surgeon: Marcina Millard, MD;  Location: [facility] CV [facility];  Service: Cardiovascular;  Laterality: N/A;   SEPTOPLASTY     TONSILECTOMY, ADENOIDECTOMY, BILATERAL MYRINGOTOMY AND TUBES     TONSILLECTOMY      Medical History: Past Medical History:  Diagnosis Date  Dysrhythmia    ED (erectile dysfunction)    Hematuria    HLD (hyperlipidemia)    Over weight    Paroxysmal atrial fibrillation (HCC)    Prostate cancer (HCC)    Rising PSA level    Sleep apnea    CPAP   Squamous cell carcinoma     Family History: Non contributory to the present illness  Social History: Social History   Socioeconomic History   Marital status: Divorced    Spouse name: Not  on file   Number of children: Not on file   Years of education: Not on file   Highest education level: Not on file  Occupational History   Occupation: retired  Tobacco Use   Smoking status: Former    Current packs/day: 0.00    Average packs/day: 1 pack/day for 11.0 years (11.0 ttl pk-yrs)    Types: Cigarettes    Start date: 04/10/1965    Quit date: 04/10/1976    Years since quitting: 47.3   Smokeless tobacco: Never  Vaping Use   Vaping status: Never Used  Substance and Sexual Activity   Alcohol use: No   Drug use: No   Sexual activity: Not on file  Other Topics Concern   Not on file  Social History Narrative   Not on file   Social Determinants of Health   Financial Resource Strain: Not on file  Food Insecurity: No Food Insecurity (07/30/2022)   Hunger Vital Sign    Worried About Running Out of Food in the Last Year: Never true    Ran Out of Food in the Last Year: Never true  Transportation Needs: No Transportation Needs (07/30/2022)   PRAPARE - Administrator, Civil Service (Medical): No    Lack of Transportation (Non-Medical): No  Physical Activity: Not on file  Stress: Not on file  Social Connections: Not on file  Intimate Partner Violence: Not At Risk (07/30/2022)   Humiliation, Afraid, Rape, and Kick questionnaire    Fear of Current or Ex-Partner: No    Emotionally Abused: No    Physically Abused: No    Sexually Abused: No    Vital Signs: Blood pressure 114/64, pulse (!) 53, resp. rate 14, height 6\' 3"  (1.905 m), weight 230 lb (104.3 kg), SpO2 98%. Body mass index is 28.75 kg/m.    Examination: General Appearance: The patient is well-developed, well-nourished, and in no distress. Neck Circumference: 38 cm Skin: Gross inspection of skin unremarkable. Head: normocephalic, no gross deformities. Eyes: no gross deformities noted. ENT: ears appear grossly normal Neurologic: Alert and oriented. No involuntary movements.  STOP BANG RISK  ASSESSMENT S (snore) Have you been told that you snore?     NO   T (tired) Are you often tired, fatigued, or sleepy during the day?   NO  O (obstruction) Do you stop breathing, choke, or gasp during sleep? NO   P (pressure) Do you have or are you being treated for high blood pressure? NO   B (BMI) Is your body index greater than 35 kg/m? NO   A (age) Are you 1 years old or older? YES   N (neck) Do you have a neck circumference greater than 16 inches?   NO   G (gender) Are you a male? YES   TOTAL STOP/BANG "YES" ANSWERS 2       A STOP-Bang score of 2 or less is considered low risk, and a score of 5 or more is high risk for having  either moderate or severe OSA. For people who score 3 or 4, doctors may need to perform further assessment to determine how likely they are to have OSA.         EPWORTH SLEEPINESS SCALE:  Scale:  (0)= no chance of dozing; (1)= slight chance of dozing; (2)= moderate chance of dozing; (3)= high chance of dozing  Chance  Situtation    Sitting and reading: 1    Watching TV: 1    Sitting Inactive in public: 0    As a passenger in car: 0      Lying down to rest: 1    Sitting and talking: 0    Sitting quielty after lunch: 0    In a car, stopped in traffic: 0   TOTAL SCORE:   3 out of 24    SLEEP STUDIES:  PSG (11/15/18) AHI 5.1, REM AHI 17, SP02 89% Titration (11/29/18) CPAP@9    CPAP COMPLIANCE DATA:  Date Range: 07/29/22-07/28/2023  Average Daily Use: 7.5 hours  Median Use: 7 hr 40 min  Compliance for > 4 Hours: 97% days  AHI: 1.4 respiratory events per hour  Days Used: 355/365  Mask Leak: 23.3  95th Percentile Pressure: 9         LABS: No results found for this or any previous visit (from the past 2160 hour(s)).  Radiology: CARDIAC CATHETERIZATION  Result Date: 07/30/2022   Dist RCA lesion is 30% stenosed.   Prox RCA lesion is 30% stenosed.   Ost LM to Mid LM lesion is 30% stenosed.   Prox LAD to Mid LAD  lesion is 95% stenosed.   Mid LAD lesion is 40% stenosed.   A drug-eluting stent was successfully placed using a STENT ONYX FRONTIER 3.0X15.   Post intervention, there is a 0% residual stenosis.   There is mild left ventricular systolic dysfunction.   The left ventricular ejection fraction is 45-50% by visual estimate. 1.  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  Mildly reduced left ventricular function with estimated LVEF 45 to 50% with anteroapical hypokinesis 3.  Shockwave proximal/mid LAD 4.  Successful DES proximal/mid LAD Recommendations 1.  Dual antiplatelet therapy uninterrupted for 1 year 2.  Continue Cardizem CD and metoprolol tartrate 3.  Consider adding high intensity atorvastatin 4.  Resume Eliquis 07/31/2022, continue triple therapy for 2 weeks then DC aspirin, continue Brilinta and Eliquis    No results found.  No results found.    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 OSA on CPAP The patient does tolerate PAP and reports  benefit from PAP use. The patient was reminded how to clean equipment and advised to replace supplies routinley. The patient was also counselled on weight loss. The compliance is excellent. The AHI is 1.4.   OSA on cpap- controlled. Continue with excellent compliance with pap. CPAP continues to be medically necessary to treat this patient's OSA. F/u one year.    2. CPAP use counseling CPAP Counseling: had a lengthy discussion with the patient regarding the importance  of PAP therapy in  management of the sleep apnea. Patient appears to understand the risk factor reduction and also understands the risks associated with untreated sleep apnea. Patient will try to make a good faith effort to remain compliant with therapy. Also instructed the patient on proper cleaning of the device including the water must be changed daily if possible and use of distilled water is preferred. Patient understands that the machine should be regularly cleaned with appropriate recommended cleaning solutions that do not damage the PAP machine for example given white vinegar and water rinses. Other methods such as ozone treatment may not be as good as these simple methods to achieve cleaning.   3. Atrial fibrillation, unspecified type (HCC) Continue f/u with cardiology. Understands importance of treating apnea.      General Counseling: I have discussed the findings of the evaluation and examination with Joshua Barajas.  I have also discussed any further diagnostic evaluation thatmay be needed or ordered today. Joshua Barajas verbalizes understanding of the findings of todays visit. We also reviewed his medications today and discussed drug interactions and side effects including but not limited excessive drowsiness and altered mental states. We also discussed that there is always a risk not just to him but also people around him. he has been encouraged to call the office with any questions or concerns that should arise related to todays visit.  No orders of the defined types were placed in this encounter.       I have personally obtained a history, examined the patient, evaluated laboratory and imaging results, formulated the assessment and plan and placed orders. This patient was seen today by Emmaline Kluver, PA-C in collaboration with Dr. Freda Munro.   Yevonne Pax, MD [facility] Diplomate ABMS Pulmonary Critical Care Medicine and Sleep Medicine

## 2023-08-04 DIAGNOSIS — G4733 Obstructive sleep apnea (adult) (pediatric): Secondary | ICD-10-CM | POA: Diagnosis not present

## 2023-08-29 ENCOUNTER — Emergency Department: Payer: PPO

## 2023-08-29 ENCOUNTER — Encounter: Payer: Self-pay | Admitting: Emergency Medicine

## 2023-08-29 ENCOUNTER — Other Ambulatory Visit: Payer: Self-pay

## 2023-08-29 ENCOUNTER — Ambulatory Visit
Admission: EM | Admit: 2023-08-29 | Discharge: 2023-08-29 | Disposition: A | Discharge status: Home / Self Care | Payer: PPO

## 2023-08-29 ENCOUNTER — Emergency Department
Admission: EM | Admit: 2023-08-29 | Discharge: 2023-08-29 | Disposition: A | Discharge status: Home / Self Care | Payer: PPO | Attending: Emergency Medicine | Admitting: Emergency Medicine

## 2023-08-29 DIAGNOSIS — I2510 Atherosclerotic heart disease of native coronary artery without angina pectoris: Secondary | ICD-10-CM | POA: Diagnosis not present

## 2023-08-29 DIAGNOSIS — S61214A Laceration without foreign body of right ring finger without damage to nail, initial encounter: Secondary | ICD-10-CM

## 2023-08-29 DIAGNOSIS — S62639A Displaced fracture of distal phalanx of unspecified finger, initial encounter for closed fracture: Secondary | ICD-10-CM | POA: Diagnosis not present

## 2023-08-29 DIAGNOSIS — W268XXA Contact with other sharp object(s), not elsewhere classified, initial encounter: Secondary | ICD-10-CM | POA: Diagnosis not present

## 2023-08-29 DIAGNOSIS — S62664B Nondisplaced fracture of distal phalanx of right ring finger, initial encounter for open fracture: Secondary | ICD-10-CM | POA: Diagnosis not present

## 2023-08-29 DIAGNOSIS — Z8546 Personal history of malignant neoplasm of prostate: Secondary | ICD-10-CM | POA: Insufficient documentation

## 2023-08-29 DIAGNOSIS — S61219A Laceration without foreign body of unspecified finger without damage to nail, initial encounter: Secondary | ICD-10-CM

## 2023-08-29 MED ORDER — LIDOCAINE HCL (PF) 1 % IJ SOLN
5.0000 mL | Freq: Once | INTRAMUSCULAR | Status: AC
  Administered 2023-08-29: 5 mL via INTRADERMAL
  Filled 2023-08-29: qty 5

## 2023-08-29 MED ORDER — CEPHALEXIN 500 MG PO CAPS: 500.0000 mg | ORAL_CAPSULE | Freq: Four times a day (QID) | ORAL | 0 refills | Status: AC

## 2023-08-29 NOTE — Discharge Instructions (Addendum)
Your xrays showed that you have a fracture to the tip of your ring finger. Keep the splint in place until you can be seen by orthopedics. This will help protect the bone and will help if there is an underlying tendon injury.   Check the skin on the tip of your finger to make sure it is pink. If it is blue, numb, or cold please return to the ED. If begins to bleed, hold pressure and elevate your hand for 15 minutes. If you are not able to get the bleeding to stop, return to the ED. Watch for signs of infection including redness, warmth, swelling, pain and pus drainage.  If you develop any of these please return to the ED, urgent care or your primary care provider.  Your stitches will need to be removed in 7-10 days, this can be done by your primary care provider, the orthopedic provider, urgent care or this ED.   You can take 650 mg of tylenol every 6 hours as needed for pain.   Please schedule a follow up appointment with Dr. Joice Lofts. When calling the office explain that you have an open fracture of the distal phalanx of the right ring finger.   I have sent some antibiotics to the pharmacy for you. Please take them as prescribed.   You can return to the ED with any new or worsening symptoms. It was a pleasure to care for you today!

## 2023-08-29 NOTE — ED Notes (Signed)
Patient is being discharged from the Urgent Care and sent to the Emergency Department via private vehicle with friend . Per Eusebio Friendly, PA, patient is in need of higher level of care due to depp laceration to his right ring finger and on blood thiner. Patient is aware and verbalizes understanding of plan of care.  Vitals:   08/29/23 1402  BP: 132/67  Pulse: 82  Resp: 15  Temp: 98.3 F (36.8 C)  SpO2: 98%

## 2023-08-29 NOTE — ED Provider Notes (Signed)
72 year old male on Eliquis presents for laceration of right ring finger that occurred about 30 minutes before arrival to urgent care.  He reports continued bleeding.  On exam patient has significant laceration with exposed tendon.  The laceration is jagged.  There are 2 distinct lacerations.  The patient seems to have good range of motion but I am concerned about the exposed tendon and deep lacerated injury.  I have suggested he go to the emergency department for further evaluation, thorough flushing and complicated wound repair.  Patient is agreeable.  Nursing staff wraps wound with gauze and Coban.   Shirlee Latch, PA-C 08/29/23 1414

## 2023-08-29 NOTE — ED Triage Notes (Signed)
Patient states that he cut his right ring finger on a piece of metal on his trailer about 30 min ago.  Patient is currently on a blood thinner.

## 2023-08-29 NOTE — ED Triage Notes (Signed)
Pt reports cut his right hand ring finger on a piece of metal today. Bleeding controlled at this time. He is unsure of his last tetanus but states it is in his medical record.

## 2023-08-29 NOTE — ED Provider Notes (Signed)
[facility] Provider Note    Event Date/Time   First MD Initiated Contact with Patient 08/29/23 1500     (approximate)   History   Laceration   HPI  Joshua Barajas is a 72 y.o. male with PMH of A-fib, CAD, prostate cancer who presents for evaluation of a deep laceration to the right ring finger.  States that the bed of his truck fell down on his finger and he yanked his hand back to avoid it cutting his finger on a piece of the metal.  He was sent over from urgent care due to the depth of the wound with a possible exposed flexor tendon.  Patient's last tetanus shot was in 2022.      Physical Exam   Triage Vital Signs: ED Triage Vitals  Encounter Vitals Group     BP 08/29/23 1445 (!) 146/74     Systolic BP Percentile --      Diastolic BP Percentile --      Pulse Rate 08/29/23 1445 (!) 55     Resp 08/29/23 1445 16     Temp 08/29/23 1445 98.2 F (36.8 C)     Temp Source 08/29/23 1445 Oral     SpO2 08/29/23 1445 98 %     Weight 08/29/23 1440 231 lb 7.7 oz (105 kg)     Height 08/29/23 1440 6\' 3"  (1.905 m)     Head Circumference --      Peak Flow --      Pain Score 08/29/23 1440 3     Pain Loc --      Pain Education --      Exclude from Growth Chart --     Most recent vital signs: Vitals:   08/29/23 1445  BP: (!) 146/74  Pulse: (!) 55  Resp: 16  Temp: 98.2 F (36.8 C)  SpO2: 98%    General: Awake, no distress.  CV:  Good peripheral perfusion.  Resp:  Normal effort.  Abd:  No distention.  Other:  2 lacerations to the palmar side of the ring finger, patient is able to fully flex the finger but cannot fully extend, capillary refill appropriate, sensation intact   ED Results / Procedures / Treatments   Labs (all labs ordered are listed, but only abnormal results are displayed) Labs Reviewed - No data to display    RADIOLOGY  Right ring finger x-ray obtained, I interpreted the images as well as reviewed the radiologist report.   Patient has a fracture of the distal phalanx.   PROCEDURES:  Critical Care performed: No  ..Laceration Repair  Date/Time: 08/29/2023 5:04 PM  Performed by: Cameron Ali, PA-C Authorized by: Cameron Ali, PA-C   Consent:    Consent obtained:  Verbal   Consent given by:  Patient   Risks, benefits, and alternatives were discussed: yes     Risks discussed:  Infection, pain, poor cosmetic result, poor wound healing, nerve damage, tendon damage and vascular damage   Alternatives discussed:  No treatment Universal protocol:    Patient identity confirmed:  Verbally with patient Anesthesia:    Anesthesia method:  Nerve block   Block location:  Base of the ring finger   Block needle gauge:  25 G   Block anesthetic:  Lidocaine 1% w/o epi   Block technique:  Flexor tendon sheath   Block injection procedure:  Anatomic landmarks identified, introduced needle and incremental injection   Block outcome:  Anesthesia achieved Laceration  details:    Location:  Finger   Finger location:  R ring finger   Length (cm):  6 (distal laceration is 4 cm, proximal is 2 cm)   Depth (mm):  8 Pre-procedure details:    Preparation:  Imaging obtained to evaluate for foreign bodies and patient was prepped and draped in usual sterile fashion Exploration:    Hemostasis achieved with:  Direct pressure   Imaging obtained: x-ray     Imaging outcome: foreign body not noted     Wound exploration: wound explored through full range of motion and entire depth of wound visualized     Wound extent: underlying fracture     Contaminated: no   Treatment:    Area cleansed with:  Povidone-iodine   Amount of cleaning:  Extensive   Irrigation solution:  Sterile saline   Irrigation volume:  50 mL   Irrigation method:  Syringe Skin repair:    Repair method:  Sutures   Suture size:  4-0   Suture material:  Prolene   Suture technique:  Simple interrupted   Number of sutures:  16 (12 in distal laceration, 4  in proximal) Approximation:    Approximation:  Close Repair type:    Repair type:  Simple Post-procedure details:    Dressing:  Bulky dressing, splint for protection, non-adherent dressing and tube gauze   Procedure completion:  Tolerated well, no immediate complications    MEDICATIONS ORDERED IN ED: Medications  lidocaine (PF) (XYLOCAINE) 1 % injection 5 mL (has no administration in time range)     IMPRESSION / MDM / ASSESSMENT AND PLAN / ED COURSE  I reviewed the triage vital signs and the nursing notes.                             72 year old male presents for evaluation of laceration to his right ring finger.  Patient was hypertensive at a little bradycardic on presentation but is NAD on exam.  Differential diagnosis includes, but is not limited to, fracture, dislocation, laceration, tendon injury, nerve injury.  Patient's presentation is most consistent with acute complicated illness / injury requiring diagnostic workup.  Laceration repaired as described in the procedure note above.  Patient was then placed in a splint to protect for his fracture as well as a possible extensor tendon injury.  He was advised to follow-up with orthopedics.  I will also send a prescription for Keflex as this is an open fracture.  Patient was agreeable to plan, voiced understanding and was stable at discharge.      FINAL CLINICAL IMPRESSION(S) / ED DIAGNOSES   Final diagnoses:  Nondisplaced fracture of distal phalanx of right ring finger, initial encounter for open fracture  Laceration of right ring finger without foreign body without damage to nail, initial encounter     Rx / DC Orders   ED Discharge Orders          Ordered    cephALEXin (KEFLEX) 500 MG capsule  4 times daily        08/29/23 1653             Note:  This document was prepared using Dragon voice recognition software and may include unintentional dictation errors.   Cameron Ali, PA-C 08/29/23 1708     Janith Lima, MD 08/29/23 [phone]

## 2023-09-07 DIAGNOSIS — S62634B Displaced fracture of distal phalanx of right ring finger, initial encounter for open fracture: Secondary | ICD-10-CM | POA: Diagnosis not present

## 2023-09-11 DIAGNOSIS — S62634D Displaced fracture of distal phalanx of right ring finger, subsequent encounter for fracture with routine healing: Secondary | ICD-10-CM | POA: Diagnosis not present

## 2023-09-16 DIAGNOSIS — R0602 Shortness of breath: Secondary | ICD-10-CM | POA: Diagnosis not present

## 2023-09-16 DIAGNOSIS — E7801 Familial hypercholesterolemia: Secondary | ICD-10-CM | POA: Diagnosis not present

## 2023-09-16 DIAGNOSIS — Z955 Presence of coronary angioplasty implant and graft: Secondary | ICD-10-CM | POA: Diagnosis not present

## 2023-09-16 DIAGNOSIS — G4733 Obstructive sleep apnea (adult) (pediatric): Secondary | ICD-10-CM | POA: Diagnosis not present

## 2023-09-23 DIAGNOSIS — S62634D Displaced fracture of distal phalanx of right ring finger, subsequent encounter for fracture with routine healing: Secondary | ICD-10-CM | POA: Diagnosis not present

## 2023-09-23 DIAGNOSIS — S62634A Displaced fracture of distal phalanx of right ring finger, initial encounter for closed fracture: Secondary | ICD-10-CM | POA: Diagnosis not present

## 2023-10-28 DIAGNOSIS — S62634D Displaced fracture of distal phalanx of right ring finger, subsequent encounter for fracture with routine healing: Secondary | ICD-10-CM | POA: Diagnosis not present

## 2023-11-03 DIAGNOSIS — G4733 Obstructive sleep apnea (adult) (pediatric): Secondary | ICD-10-CM | POA: Diagnosis not present

## 2023-11-03 DIAGNOSIS — I2510 Atherosclerotic heart disease of native coronary artery without angina pectoris: Secondary | ICD-10-CM | POA: Diagnosis not present

## 2023-11-03 DIAGNOSIS — Z01818 Encounter for other preprocedural examination: Secondary | ICD-10-CM | POA: Diagnosis not present

## 2023-11-03 DIAGNOSIS — I10 Essential (primary) hypertension: Secondary | ICD-10-CM | POA: Diagnosis not present

## 2023-11-03 DIAGNOSIS — E785 Hyperlipidemia, unspecified: Secondary | ICD-10-CM | POA: Diagnosis not present

## 2023-11-03 DIAGNOSIS — I480 Paroxysmal atrial fibrillation: Secondary | ICD-10-CM | POA: Diagnosis not present

## 2023-11-05 DIAGNOSIS — Z79899 Other long term (current) drug therapy: Secondary | ICD-10-CM | POA: Diagnosis not present

## 2023-11-05 DIAGNOSIS — E785 Hyperlipidemia, unspecified: Secondary | ICD-10-CM | POA: Diagnosis not present

## 2023-11-05 DIAGNOSIS — S62634A Displaced fracture of distal phalanx of right ring finger, initial encounter for closed fracture: Secondary | ICD-10-CM | POA: Diagnosis not present

## 2023-11-05 DIAGNOSIS — I480 Paroxysmal atrial fibrillation: Secondary | ICD-10-CM | POA: Diagnosis not present

## 2023-11-05 DIAGNOSIS — I10 Essential (primary) hypertension: Secondary | ICD-10-CM | POA: Diagnosis not present

## 2023-11-05 DIAGNOSIS — I2510 Atherosclerotic heart disease of native coronary artery without angina pectoris: Secondary | ICD-10-CM | POA: Diagnosis not present

## 2023-11-05 DIAGNOSIS — G4733 Obstructive sleep apnea (adult) (pediatric): Secondary | ICD-10-CM | POA: Diagnosis not present

## 2023-11-05 DIAGNOSIS — I4891 Unspecified atrial fibrillation: Secondary | ICD-10-CM | POA: Diagnosis not present

## 2023-11-05 DIAGNOSIS — S62634D Displaced fracture of distal phalanx of right ring finger, subsequent encounter for fracture with routine healing: Secondary | ICD-10-CM | POA: Diagnosis not present

## 2023-11-16 ENCOUNTER — Other Ambulatory Visit: Payer: Self-pay | Admitting: *Deleted

## 2023-11-16 DIAGNOSIS — C61 Malignant neoplasm of prostate: Secondary | ICD-10-CM

## 2023-11-18 DIAGNOSIS — S62634D Displaced fracture of distal phalanx of right ring finger, subsequent encounter for fracture with routine healing: Secondary | ICD-10-CM | POA: Diagnosis not present

## 2023-11-18 DIAGNOSIS — Z9889 Other specified postprocedural states: Secondary | ICD-10-CM | POA: Diagnosis not present

## 2023-11-18 DIAGNOSIS — M25641 Stiffness of right hand, not elsewhere classified: Secondary | ICD-10-CM | POA: Diagnosis not present

## 2023-11-23 ENCOUNTER — Other Ambulatory Visit: Payer: PPO

## 2023-11-23 DIAGNOSIS — C61 Malignant neoplasm of prostate: Secondary | ICD-10-CM | POA: Diagnosis not present

## 2023-11-24 LAB — PSA: Prostate Specific Ag, Serum: <0.1 ng/mL (ref 0.0–4.0)

## 2023-11-24 NOTE — Progress Notes (Signed)
 11/26/2023 8:39 AM   Joshua Barajas 1951/04/01 [id number]  Referring provider: Jeffie Cheryl BRAVO, MD [address] [place],  [place] [address]  Urological history: 1. Prostate cancer -PSA (10/2023) <0.1 -underwent prostate biopsy 2016 for an accelerating PSA velocity most recently to 3.2 -initial prostate biopsy 2/16 revealed Gleason 3+3 prostate cancer involving 6 of 12 cores bilaterally, 4 on the LEFT (lateral base, lateral mid, and lateral apex) and 2 on the RIGHT (base up to 83% tissue).    His prostate exam was fairly unremarkable, diffusely firm.  TRUS vol 26 cc.  -Repeat prostate biopsy on 04/13/15 showed Gleason 3+3 in 6/12 cores, 4 on the right and 2 on the left this time involving up on 33% at the left apex but otherwise farily low volume diease -transferred his care to [facility] and elected to undergo CyberKnife radiation (36 Gy completed 10/09/2015)   2. Nocturia -Risk factors for nocturia: obstructive sleep apnea and hypertension -uses CPAP  Chief Complaint  Patient presents with   Follow-up    1 year follow-up   HPI: Joshua Barajas is a 73 y.o. male who presents today for yearly follow up.   Previous records reviewed.     He does have issues with erectile dysfunction.  He had failed PDE 5 inhibitors.  He is divorced and not interested in restarting a relationship, so he is not wanting any further treatment for his ED.   I PSS 3/1  He sometimes have to strain to complete urination, but it is not bothersome to him.  Patient denies any modifying or aggravating factors.  Patient denies any recent UTI's, gross hematuria, dysuria or suprapubic/flank pain.  Patient denies any fevers, chills, nausea or vomiting.     IPSS     Row Name 11/26/23 0800         International Prostate Symptom Score   How often have you had the sensation of not emptying your bladder? Not at All     How often have you had to urinate less than every two hours? Not at All     How often have you  found you stopped and started again several times when you urinated? Not at All     How often have you found it difficult to postpone urination? Not at All     How often have you had a weak urinary stream? Not at All     How often have you had to strain to start urination? Not at All     How many times did you typically get up at night to urinate? 3 Times     Total IPSS Score 3       Quality of Life due to urinary symptoms   If you were to spend the rest of your life with your urinary condition just the way it is now how would you feel about that? Pleased               Score:  1-7 Mild 8-19 Moderate 20-35 Severe   PMH: Past Medical History:  Diagnosis Date   Dysrhythmia    ED (erectile dysfunction)    Hematuria    HLD (hyperlipidemia)    Over weight    Paroxysmal atrial fibrillation (HCC)    Prostate cancer (HCC)    Rising PSA level    Sleep apnea    CPAP   Squamous cell carcinoma     Surgical History: Past Surgical History:  Procedure Laterality Date  CATARACT EXTRACTION Bilateral    CLOSED REDUCTION NASAL FRACTURE N/A 03/12/2021   Procedure: CLOSED REDUCTION NASAL FRACTURE;  Surgeon: Blair Mt, MD;  Location: [facility];  Service: ENT;  Laterality: N/A;  sleep apnea   COLONOSCOPY     COLONOSCOPY WITH PROPOFOL  N/A 09/28/2020   Procedure: COLONOSCOPY WITH PROPOFOL ;  Surgeon: Maryruth Ole DASEN, MD;  Location: [facility];  Service: Endoscopy;  Laterality: N/A;   CORONARY STENT INTERVENTION N/A 07/30/2022   Procedure: CORONARY STENT INTERVENTION;  Surgeon: Ammon Blunt, MD;  Location: [facility];  Service: Cardiovascular;  Laterality: N/A;   EYE SURGERY     LEFT HEART CATH AND CORONARY ANGIOGRAPHY N/A 07/30/2022   Procedure: LEFT HEART CATH AND CORONARY ANGIOGRAPHY;  Surgeon: Ammon Blunt, MD;  Location: [facility] CV LAB;  Service: Cardiovascular;  Laterality: N/A;   SEPTOPLASTY     TONSILECTOMY, ADENOIDECTOMY,  BILATERAL MYRINGOTOMY AND TUBES     TONSILLECTOMY      Home Medications:  Allergies as of 11/26/2023       Reactions   Latex Rash   Bandaids(only) if left on for a few days.        Medication List        Accurate as of November 26, 2023  8:39 AM. If you have any questions, ask your nurse or doctor.          apixaban  5 MG Tabs tablet Commonly known as: ELIQUIS  Take 1 tablet (5 mg total) by mouth 2 (two) times daily.   aspirin  EC 81 MG tablet Take 81 mg by mouth daily. Swallow whole.   atorvastatin  80 MG tablet Commonly known as: LIPITOR  Take by mouth.   cholecalciferol 25 MCG (1000 UNIT) tablet Commonly known as: VITAMIN D3 Take 1,000 Units by mouth daily.   clopidogrel 75 MG tablet Commonly known as: PLAVIX Take 1 tablet (75 mg) by mouth daily.   cyanocobalamin  250 MCG tablet Commonly known as: VITAMIN B12 Take 250 mcg by mouth 2 (two) times a week.   metoprolol  tartrate 25 MG tablet Commonly known as: LOPRESSOR  Take 1 tablet (25 mg total) by mouth 2 (two) times daily.   multivitamin tablet Take 1 tablet by mouth daily.   vitamin k 100 MCG tablet Take 100 mcg by mouth daily.        Allergies:  Allergies  Allergen Reactions   Latex Rash    Bandaids(only) if left on for a few days.    Family History: Family History  Problem Relation Age of Onset   Heart attack Father    Rheum arthritis Father    Heart disease Paternal Uncle     Social History:  reports that he quit smoking about 47 years ago. His smoking use included cigarettes. He started smoking about 58 years ago. He has a 11 pack-year smoking history. He has never been exposed to tobacco smoke. He has never used smokeless tobacco. He reports that he does not drink alcohol and does not use drugs.  ROS: For pertinent review of systems please refer to history of present illness  Physical Exam: BP 110/66   Pulse (!) 49   Ht 6' 3 (1.905 m)   Wt 227 lb (103 kg)   BMI 28.37 kg/m    Constitutional:  Well nourished. Alert and oriented, No acute distress. HEENT: Ray AT, moist mucus membranes.  Trachea midline, no masses. Cardiovascular: No clubbing, cyanosis, or edema. Respiratory: Normal respiratory effort, no increased work of breathing. Neurologic: Grossly intact, no  focal deficits, moving all 4 extremities. Psychiatric: Normal mood and affect.   Laboratory Data: Component     Latest Ref Rng 11/23/2023  Prostate Specific Ag, Serum     0.0 - 4.0 ng/mL <0.1    CBC w/auto Differential (3 Part) Order: [id number] Component Ref Range & Units 1 yr ago  WBC (White Blood Cell Count) 4.1 - 10.2 10^3/uL 4.3  RBC (Red Blood Cell Count) 4.69 - 6.13 10^6/uL 3.57 Low   Hemoglobin 14.1 - 18.1 gm/dL 88.1 Low   Hematocrit 59.9 - 52.0 % 35.4 Low   MCV (Mean Corpuscular Volume) 80.0 - 100.0 fl 99.2  MCH (Mean Corpuscular Hemoglobin) 27.0 - 31.2 pg 33.1 High   MCHC (Mean Corpuscular Hemoglobin Concentration) 32.0 - 36.0 gm/dL 66.6  Platelet Count 849 - 450 10^3/uL 128 Low   RDW-CV (Red Cell Distribution Width) 11.6 - 14.8 % 13.6  MPV (Mean Platelet Volume) 9.4 - 12.4 fl 10.9  Neutrophils 1.50 - 7.80 10^3/uL 2.00  Lymphocytes 1.00 - 3.60 10^3/uL 1.40  Mixed Count 0.10 - 0.90 10^3/uL 0.90  Neutrophil % 32.0 - 70.0 % 48.5  Lymphocyte % 10.0 - 50.0 % 31.6  Mixed % 3.0 - 14.4 % 19.9 High   Resulting Agency [facility]   Specimen Collected: 11/14/22 10:13   Performed by: [facility] Last Resulted: 11/14/22 13:11  Received From: [facility]  Result Received: 11/17/22 10:48   Comprehensive Metabolic Panel (CMP) Order: [id number] Component Ref Range & Units 1 yr ago  Glucose 70 - 110 mg/dL 896  Sodium 863 - 854 mmol/L 140  Potassium 3.6 - 5.1 mmol/L 4.3  Chloride 97 - 109 mmol/L 106  Carbon Dioxide (CO2) 22.0 - 32.0 mmol/L 30.9  Urea Nitrogen (BUN) 7 - 25 mg/dL 11  Creatinine 0.7 - 1.3 mg/dL 0.8   Glomerular Filtration Rate (eGFR) >60 mL/min/1.73sq m 95  Comment: CKD-EPI (2021) does not include patient's race in the calculation of eGFR.  Monitoring changes of plasma creatinine and eGFR over time is useful for monitoring kidney function.  Interpretive Ranges for eGFR (CKD-EPI 2021):  eGFR:       >60 mL/min/1.73 sq. m - Normal eGFR:       30-59 mL/min/1.73 sq. m - Moderately Decreased eGFR:       15-29 mL/min/1.73 sq. m  - Severely Decreased eGFR:       < 15 mL/min/1.73 sq. m  - Kidney Failure   Note: These eGFR calculations do not apply in acute situations when eGFR is changing rapidly or patients on dialysis.  Calcium  8.7 - 10.3 mg/dL 9.0  AST 8 - 39 U/L 21  ALT 6 - 57 U/L 20  Alk Phos (alkaline Phosphatase) 34 - 104 U/L 64  Albumin 3.5 - 4.8 g/dL 3.9  Bilirubin, Total 0.3 - 1.2 mg/dL 0.9  Protein, Total 6.1 - 7.9 g/dL 6.8  A/G Ratio 1.0 - 5.0 gm/dL 1.3  Resulting Agency [facility]   Specimen Collected: 11/14/22 10:13   Performed by: [facility] Last Resulted: 11/14/22 15:50  Received From: [facility]  Result Received: 11/17/22 10:48   Lipid Panel w/calc LDL Order: [id number] Component Ref Range & Units 1 yr ago  Cholesterol, Total 100 - 200 mg/dL 98 Low   Triglyceride 35 - 199 mg/dL 54  HDL (High Density Lipoprotein) Cholesterol 29.0 - 71.0 mg/dL 63.2  LDL Calculated 0 - 130 mg/dL 51  VLDL  Cholesterol mg/dL 11  Cholesterol/HDL Ratio 2.7  Resulting Agency [facility]   Specimen Collected: 11/14/22 10:13   Performed by: [facility] Last Resulted: 11/14/22 15:50  Received From: [facility]  Result Received: 11/17/22 10:48   Thyroid  Stimulating-Hormone (TSH) Order: [id number] Component Ref Range & Units 1 yr ago  Thyroid  Stimulating Hormone (TSH) 0.450-5.330 uIU/ml uIU/mL 1.685  Resulting Agency [facility]   Specimen Collected: 11/14/22  10:13   Performed by: [facility] Last Resulted: 11/14/22 15:50  Received From: [facility]  Result Received: 11/17/22 10:48  I have reviewed the labs.  Pertinent imaging: N/A  Assessment & Plan:    1. Prostate cancer -PSA remains undetectable  2. Nocturia -sleeping with CPAP -not bothersome  3. Mixed incontinence -only occurs when he holds bladder for long periods of time -not bothersome  Return in about 1 year (around 11/25/2024) for PSA, I PSS .  CLOTILDA HELON RIGGERS  [facility] [address] [place], [place] [address] [phone]

## 2023-11-26 ENCOUNTER — Encounter: Payer: Self-pay | Admitting: Urology

## 2023-11-26 ENCOUNTER — Ambulatory Visit: Payer: Self-pay | Admitting: Urology

## 2023-11-26 VITALS — BP 110/66 | HR 49 | Ht 75.0 in | Wt 227.0 lb

## 2023-11-26 DIAGNOSIS — Z8546 Personal history of malignant neoplasm of prostate: Secondary | ICD-10-CM | POA: Diagnosis not present

## 2023-11-26 DIAGNOSIS — N3946 Mixed incontinence: Secondary | ICD-10-CM | POA: Diagnosis not present

## 2023-11-26 DIAGNOSIS — C61 Malignant neoplasm of prostate: Secondary | ICD-10-CM

## 2023-11-26 DIAGNOSIS — R351 Nocturia: Secondary | ICD-10-CM

## 2023-11-30 DIAGNOSIS — Z79899 Other long term (current) drug therapy: Secondary | ICD-10-CM | POA: Diagnosis not present

## 2023-11-30 DIAGNOSIS — Z0000 Encounter for general adult medical examination without abnormal findings: Secondary | ICD-10-CM | POA: Diagnosis not present

## 2023-11-30 DIAGNOSIS — G4733 Obstructive sleep apnea (adult) (pediatric): Secondary | ICD-10-CM | POA: Diagnosis not present

## 2023-11-30 DIAGNOSIS — D649 Anemia, unspecified: Secondary | ICD-10-CM | POA: Diagnosis not present

## 2023-11-30 DIAGNOSIS — Z1331 Encounter for screening for depression: Secondary | ICD-10-CM | POA: Diagnosis not present

## 2023-11-30 DIAGNOSIS — I77811 Abdominal aortic ectasia: Secondary | ICD-10-CM | POA: Diagnosis not present

## 2023-11-30 DIAGNOSIS — C61 Malignant neoplasm of prostate: Secondary | ICD-10-CM | POA: Diagnosis not present

## 2023-11-30 DIAGNOSIS — I480 Paroxysmal atrial fibrillation: Secondary | ICD-10-CM | POA: Diagnosis not present

## 2023-12-01 DIAGNOSIS — Z85828 Personal history of other malignant neoplasm of skin: Secondary | ICD-10-CM | POA: Diagnosis not present

## 2023-12-01 DIAGNOSIS — D2272 Melanocytic nevi of left lower limb, including hip: Secondary | ICD-10-CM | POA: Diagnosis not present

## 2023-12-01 DIAGNOSIS — Z08 Encounter for follow-up examination after completed treatment for malignant neoplasm: Secondary | ICD-10-CM | POA: Diagnosis not present

## 2023-12-01 DIAGNOSIS — D225 Melanocytic nevi of trunk: Secondary | ICD-10-CM | POA: Diagnosis not present

## 2023-12-01 DIAGNOSIS — D2271 Melanocytic nevi of right lower limb, including hip: Secondary | ICD-10-CM | POA: Diagnosis not present

## 2023-12-01 DIAGNOSIS — D2261 Melanocytic nevi of right upper limb, including shoulder: Secondary | ICD-10-CM | POA: Diagnosis not present

## 2023-12-01 DIAGNOSIS — D2262 Melanocytic nevi of left upper limb, including shoulder: Secondary | ICD-10-CM | POA: Diagnosis not present

## 2023-12-01 DIAGNOSIS — L821 Other seborrheic keratosis: Secondary | ICD-10-CM | POA: Diagnosis not present

## 2023-12-16 DIAGNOSIS — S62634D Displaced fracture of distal phalanx of right ring finger, subsequent encounter for fracture with routine healing: Secondary | ICD-10-CM | POA: Diagnosis not present

## 2023-12-31 DIAGNOSIS — Z9889 Other specified postprocedural states: Secondary | ICD-10-CM | POA: Diagnosis not present

## 2023-12-31 DIAGNOSIS — M25641 Stiffness of right hand, not elsewhere classified: Secondary | ICD-10-CM | POA: Diagnosis not present

## 2023-12-31 DIAGNOSIS — S62634D Displaced fracture of distal phalanx of right ring finger, subsequent encounter for fracture with routine healing: Secondary | ICD-10-CM | POA: Diagnosis not present

## 2024-01-27 DIAGNOSIS — S62634D Displaced fracture of distal phalanx of right ring finger, subsequent encounter for fracture with routine healing: Secondary | ICD-10-CM | POA: Diagnosis not present

## 2024-01-27 DIAGNOSIS — M25641 Stiffness of right hand, not elsewhere classified: Secondary | ICD-10-CM | POA: Diagnosis not present

## 2024-01-27 DIAGNOSIS — Z9889 Other specified postprocedural states: Secondary | ICD-10-CM | POA: Diagnosis not present

## 2024-02-18 DIAGNOSIS — M25641 Stiffness of right hand, not elsewhere classified: Secondary | ICD-10-CM | POA: Diagnosis not present

## 2024-02-18 DIAGNOSIS — Z9889 Other specified postprocedural states: Secondary | ICD-10-CM | POA: Diagnosis not present

## 2024-02-18 DIAGNOSIS — S62634D Displaced fracture of distal phalanx of right ring finger, subsequent encounter for fracture with routine healing: Secondary | ICD-10-CM | POA: Diagnosis not present

## 2024-02-22 DIAGNOSIS — R0602 Shortness of breath: Secondary | ICD-10-CM | POA: Diagnosis not present

## 2024-02-22 DIAGNOSIS — Z955 Presence of coronary angioplasty implant and graft: Secondary | ICD-10-CM | POA: Diagnosis not present

## 2024-02-22 DIAGNOSIS — E7801 Familial hypercholesterolemia: Secondary | ICD-10-CM | POA: Diagnosis not present

## 2024-02-22 DIAGNOSIS — I480 Paroxysmal atrial fibrillation: Secondary | ICD-10-CM | POA: Diagnosis not present

## 2024-02-23 ENCOUNTER — Other Ambulatory Visit: Payer: Self-pay | Admitting: Cardiology

## 2024-02-23 DIAGNOSIS — R0602 Shortness of breath: Secondary | ICD-10-CM

## 2024-02-23 DIAGNOSIS — Z955 Presence of coronary angioplasty implant and graft: Secondary | ICD-10-CM

## 2024-02-25 DIAGNOSIS — R0602 Shortness of breath: Secondary | ICD-10-CM | POA: Diagnosis not present

## 2024-03-01 ENCOUNTER — Encounter
Admission: RE | Admit: 2024-03-01 | Discharge: 2024-03-01 | Disposition: A | Discharge status: Home / Self Care | Source: Ambulatory Visit | Attending: Cardiology | Admitting: Cardiology

## 2024-03-01 DIAGNOSIS — Z955 Presence of coronary angioplasty implant and graft: Secondary | ICD-10-CM | POA: Insufficient documentation

## 2024-03-01 DIAGNOSIS — R0602 Shortness of breath: Secondary | ICD-10-CM | POA: Diagnosis not present

## 2024-03-01 MED ORDER — TECHNETIUM TC 99M TETROFOSMIN IV KIT
10.6100 | PACK | Freq: Once | INTRAVENOUS | Status: AC | PRN
  Administered 2024-03-01: 10.61 via INTRAVENOUS

## 2024-03-01 MED ORDER — TECHNETIUM TC 99M TETROFOSMIN IV KIT
31.3900 | PACK | Freq: Once | INTRAVENOUS | Status: AC | PRN
Start: 2024-03-01 — End: 2024-03-01
  Administered 2024-03-01: 31.39 via INTRAVENOUS

## 2024-03-03 LAB — NM MYOCAR MULTI W/SPECT W/WALL MOTION / EF
Angina Index: 0
Base ST Depression (mm): 0 mm
Duke Treadmill Score: 10
Estimated workload: 11.5
Exercise duration (min): 9 min
Exercise duration (sec): 54 s
LV dias vol: 128 mL (ref 62–150)
LV sys vol: 34 mL
MPHR: 148 {beats}/min
Nuc Stress EF: 73 %
Peak HR: 137 {beats}/min
Percent HR: 92 %
Rest HR: 61 {beats}/min
Rest Nuclear Isotope Dose: 10.6 mCi
SDS: 0
SRS: 3
SSS: 0
ST Depression (mm): 0 mm
Stress Nuclear Isotope Dose: 31.4 mCi
TID: 0.84

## 2024-03-09 DIAGNOSIS — S62634D Displaced fracture of distal phalanx of right ring finger, subsequent encounter for fracture with routine healing: Secondary | ICD-10-CM | POA: Diagnosis not present

## 2024-03-09 DIAGNOSIS — M25641 Stiffness of right hand, not elsewhere classified: Secondary | ICD-10-CM | POA: Diagnosis not present

## 2024-03-15 DIAGNOSIS — G4733 Obstructive sleep apnea (adult) (pediatric): Secondary | ICD-10-CM | POA: Diagnosis not present

## 2024-03-15 DIAGNOSIS — E7801 Familial hypercholesterolemia: Secondary | ICD-10-CM | POA: Diagnosis not present

## 2024-03-15 DIAGNOSIS — Z955 Presence of coronary angioplasty implant and graft: Secondary | ICD-10-CM | POA: Diagnosis not present

## 2024-03-15 DIAGNOSIS — R0602 Shortness of breath: Secondary | ICD-10-CM | POA: Diagnosis not present

## 2024-03-15 DIAGNOSIS — I480 Paroxysmal atrial fibrillation: Secondary | ICD-10-CM | POA: Diagnosis not present

## 2024-04-14 DIAGNOSIS — I480 Paroxysmal atrial fibrillation: Secondary | ICD-10-CM | POA: Diagnosis not present

## 2024-04-14 DIAGNOSIS — Z79899 Other long term (current) drug therapy: Secondary | ICD-10-CM | POA: Diagnosis not present

## 2024-04-14 DIAGNOSIS — I2510 Atherosclerotic heart disease of native coronary artery without angina pectoris: Secondary | ICD-10-CM | POA: Diagnosis not present

## 2024-04-14 DIAGNOSIS — Z91040 Latex allergy status: Secondary | ICD-10-CM | POA: Diagnosis not present

## 2024-04-14 DIAGNOSIS — M25641 Stiffness of right hand, not elsewhere classified: Secondary | ICD-10-CM | POA: Diagnosis not present

## 2024-04-14 DIAGNOSIS — G4733 Obstructive sleep apnea (adult) (pediatric): Secondary | ICD-10-CM | POA: Diagnosis not present

## 2024-04-14 DIAGNOSIS — Z7902 Long term (current) use of antithrombotics/antiplatelets: Secondary | ICD-10-CM | POA: Diagnosis not present

## 2024-04-14 DIAGNOSIS — S62634A Displaced fracture of distal phalanx of right ring finger, initial encounter for closed fracture: Secondary | ICD-10-CM | POA: Diagnosis not present

## 2024-04-14 DIAGNOSIS — S62634D Displaced fracture of distal phalanx of right ring finger, subsequent encounter for fracture with routine healing: Secondary | ICD-10-CM | POA: Diagnosis not present

## 2024-04-14 DIAGNOSIS — I10 Essential (primary) hypertension: Secondary | ICD-10-CM | POA: Diagnosis not present

## 2024-04-14 DIAGNOSIS — E785 Hyperlipidemia, unspecified: Secondary | ICD-10-CM | POA: Diagnosis not present

## 2024-04-14 DIAGNOSIS — N529 Male erectile dysfunction, unspecified: Secondary | ICD-10-CM | POA: Diagnosis not present

## 2024-04-14 DIAGNOSIS — Z87891 Personal history of nicotine dependence: Secondary | ICD-10-CM | POA: Diagnosis not present

## 2024-04-19 DIAGNOSIS — M25641 Stiffness of right hand, not elsewhere classified: Secondary | ICD-10-CM | POA: Diagnosis not present

## 2024-04-26 DIAGNOSIS — S62634D Displaced fracture of distal phalanx of right ring finger, subsequent encounter for fracture with routine healing: Secondary | ICD-10-CM | POA: Diagnosis not present

## 2024-04-26 DIAGNOSIS — M25641 Stiffness of right hand, not elsewhere classified: Secondary | ICD-10-CM | POA: Diagnosis not present

## 2024-05-04 DIAGNOSIS — S62634D Displaced fracture of distal phalanx of right ring finger, subsequent encounter for fracture with routine healing: Secondary | ICD-10-CM | POA: Diagnosis not present

## 2024-05-04 DIAGNOSIS — Z9889 Other specified postprocedural states: Secondary | ICD-10-CM | POA: Diagnosis not present

## 2024-05-04 DIAGNOSIS — M25641 Stiffness of right hand, not elsewhere classified: Secondary | ICD-10-CM | POA: Diagnosis not present

## 2024-05-18 DIAGNOSIS — Z9889 Other specified postprocedural states: Secondary | ICD-10-CM | POA: Diagnosis not present

## 2024-05-18 DIAGNOSIS — M25641 Stiffness of right hand, not elsewhere classified: Secondary | ICD-10-CM | POA: Diagnosis not present

## 2024-05-18 DIAGNOSIS — S62634D Displaced fracture of distal phalanx of right ring finger, subsequent encounter for fracture with routine healing: Secondary | ICD-10-CM | POA: Diagnosis not present

## 2024-07-22 DIAGNOSIS — R0602 Shortness of breath: Secondary | ICD-10-CM | POA: Diagnosis not present

## 2024-07-22 DIAGNOSIS — G4733 Obstructive sleep apnea (adult) (pediatric): Secondary | ICD-10-CM | POA: Diagnosis not present

## 2024-07-22 DIAGNOSIS — R079 Chest pain, unspecified: Secondary | ICD-10-CM | POA: Diagnosis not present

## 2024-07-22 DIAGNOSIS — Z955 Presence of coronary angioplasty implant and graft: Secondary | ICD-10-CM | POA: Diagnosis not present

## 2024-07-22 DIAGNOSIS — I480 Paroxysmal atrial fibrillation: Secondary | ICD-10-CM | POA: Diagnosis not present

## 2024-08-07 NOTE — Progress Notes (Unsigned)
 [facility] [address] [place], [place] [address]  Pulmonary Sleep Medicine   Office Visit Note  Patient Name: Joshua Barajas DOB: [birth year] MRN [id number]    Chief Complaint: Obstructive Sleep Apnea visit  Brief History:  Marvell is seen today for an annual follow up visit for CPAP@ 9 cmH2O. The patient has a 5 year history of sleep apnea. Patient is using PAP nightly.  The patient feels rested after sleeping with PAP.  The patient reports benefiting from PAP use. Reported sleepiness is  improved and the Epworth Sleepiness Score is 2 out of 24. The patient will occasionally take naps. The patient complains of the following: none. The compliance download shows 99% compliance with an average use time of 7 hours 46 minutes. The AHI is 1.0.  The patient does not complain of limb movements disrupting sleep. The patient continues to require PAP therapy in order to eliminate sleep apnea.   ROS  General: (-) fever, (-) chills, (-) night sweat Nose and Sinuses: (-) nasal stuffiness or itchiness, (-) postnasal drip, (-) nosebleeds, (-) sinus trouble. Mouth and Throat: (-) sore throat, (-) hoarseness. Neck: (-) swollen glands, (-) enlarged thyroid , (-) neck pain. Respiratory: - cough, - shortness of breath, - wheezing. Neurologic: - numbness, - tingling. Psychiatric: - anxiety, - depression   Current Medication: Outpatient Encounter Medications as of 08/08/2024  Medication Sig   apixaban  (ELIQUIS ) 5 MG TABS tablet Take 1 tablet (5 mg total) by mouth 2 (two) times daily.   atorvastatin  (LIPITOR ) 80 MG tablet Take by mouth.   cholecalciferol (VITAMIN D3) 25 MCG (1000 UNIT) tablet Take 1,000 Units by mouth daily.   clopidogrel (PLAVIX) 75 MG tablet Take 1 tablet (75 mg) by mouth daily.   cyanocobalamin  (VITAMIN B12) 250 MCG tablet Take 250 mcg by mouth 2 (two) times a week.   metoprolol  tartrate (LOPRESSOR ) 25 MG tablet Take 1 tablet (25 mg total) by mouth 2 (two) times  daily.   Multiple Vitamin (MULTIVITAMIN) tablet Take 1 tablet by mouth daily.   vitamin k 100 MCG tablet Take 100 mcg by mouth daily.   [DISCONTINUED] aspirin  EC 81 MG tablet Take 81 mg by mouth daily. Swallow whole.   No facility-administered encounter medications on file as of 08/08/2024.    Surgical History: Past Surgical History:  Procedure Laterality Date   CATARACT EXTRACTION Bilateral    CLOSED REDUCTION NASAL FRACTURE N/A 03/12/2021   Procedure: CLOSED REDUCTION NASAL FRACTURE;  Surgeon: Blair Mt, MD;  Location: [facility];  Service: ENT;  Laterality: N/A;  sleep apnea   COLONOSCOPY     COLONOSCOPY WITH PROPOFOL  N/A 09/28/2020   Procedure: COLONOSCOPY WITH PROPOFOL ;  Surgeon: Maryruth Ole DASEN, MD;  Location: [facility];  Service: Endoscopy;  Laterality: N/A;   CORONARY STENT INTERVENTION N/A 07/30/2022   Procedure: CORONARY STENT INTERVENTION;  Surgeon: Ammon Blunt, MD;  Location: [facility];  Service: Cardiovascular;  Laterality: N/A;   EYE SURGERY     LEFT HEART CATH AND CORONARY ANGIOGRAPHY N/A 07/30/2022   Procedure: LEFT HEART CATH AND CORONARY ANGIOGRAPHY;  Surgeon: Ammon Blunt, MD;  Location: [facility];  Service: Cardiovascular;  Laterality: N/A;   SEPTOPLASTY     TONSILECTOMY, ADENOIDECTOMY, BILATERAL MYRINGOTOMY AND TUBES     TONSILLECTOMY      Medical History: Past Medical History:  Diagnosis Date   Dysrhythmia    ED (erectile dysfunction)    Hematuria    HLD (hyperlipidemia)  Over weight    Paroxysmal atrial fibrillation (HCC)    Prostate cancer (HCC)    Rising PSA level    Sleep apnea    CPAP   Squamous cell carcinoma     Family History: Non contributory to the present illness  Social History: Social History   Socioeconomic History   Marital status: Divorced    Spouse name: Not on file   Number of children: Not on file   Years of education: Not on file   Highest education level: Not  on file  Occupational History   Occupation: retired  Tobacco Use   Smoking status: Former    Current packs/day: 0.00    Average packs/day: 1 pack/day for 11.0 years (11.0 ttl pk-yrs)    Types: Cigarettes    Start date: 04/10/1965    Quit date: 04/10/1976    Years since quitting: 48.3    Passive exposure: Never   Smokeless tobacco: Never  Vaping Use   Vaping status: Never Used  Substance and Sexual Activity   Alcohol use: No   Drug use: No   Sexual activity: Not on file  Other Topics Concern   Not on file  Social History Narrative   Not on file   Social Drivers of Health   Financial Resource Strain: Low Risk  (11/30/2023)   Received from [facility]   Overall Financial Resource Strain (CARDIA)    Difficulty of Paying Living Expenses: Not hard at all  Food Insecurity: No Food Insecurity (11/30/2023)   Received from [facility]   Hunger Vital Sign    Within the past 12 months, you worried that your food would run out before you got the money to buy more.: Never true    Within the past 12 months, the food you bought just didn't last and you didn't have money to get more.: Never true  Transportation Needs: No Transportation Needs (11/30/2023)   Received from [facility] - Transportation    In the past 12 months, has lack of transportation kept you from medical appointments or from getting medications?: No    Lack of Transportation (Non-Medical): No  Physical Activity: Not on file  Stress: Not on file  Social Connections: Not on file  Intimate Partner Violence: Not At Risk (04/14/2024)   Received from [facility]   Humiliation, Afraid, Rape, and Kick questionnaire    Within the last year, have you been afraid of your partner or ex-partner?: No    Within the last year, have you been humiliated or emotionally abused in other ways by your partner or ex-partner?: No    Within the last year, have you been kicked, hit,  slapped, or otherwise physically hurt by your partner or ex-partner?: No    Within the last year, have you been raped or forced to have any kind of sexual activity by your partner or ex-partner?: No    Vital Signs: Blood pressure 129/70, pulse (!) 56, resp. rate 16, height 6' 3 (1.905 m), weight 222 lb (100.7 kg), SpO2 98%. Body mass index is 27.75 kg/m.    Examination: General Appearance: The patient is well-developed, well-nourished, and in no distress. Neck Circumference: 38 cm Skin: Gross inspection of skin unremarkable. Head: normocephalic, no gross deformities. Eyes: no gross deformities noted. ENT: ears appear grossly normal Neurologic: Alert and oriented. No involuntary movements.  STOP BANG RISK ASSESSMENT S (snore) Have you been told that you snore?  NO   T (tired) Are you often tired, fatigued, or sleepy during the day?   NO  O (obstruction) Do you stop breathing, choke, or gasp during sleep? NO   P (pressure) Do you have or are you being treated for high blood pressure? NO   B (BMI) Is your body index greater than 35 kg/m? NO   A (age) Are you 30 years old or older? YES   N (neck) Do you have a neck circumference greater than 16 inches?   NO   G (gender) Are you a male? YES   TOTAL STOP/BANG "YES" ANSWERS 2       A STOP-Bang score of 2 or less is considered low risk, and a score of 5 or more is high risk for having either moderate or severe OSA. For people who score 3 or 4, doctors may need to perform further assessment to determine how likely they are to have OSA.         EPWORTH SLEEPINESS SCALE:  Scale:  (0)= no chance of dozing; (1)= slight chance of dozing; (2)= moderate chance of dozing; (3)= high chance of dozing  Chance  Situtation    Sitting and reading: 0    Watching TV: 0    Sitting Inactive in public: 0    As a passenger in car: 0      Lying down to rest: 2    Sitting and talking: 0    Sitting quielty after lunch: 0    In a  car, stopped in traffic: 0   TOTAL SCORE:   2 out of 24    SLEEP STUDIES:  PSG (10/2018) AHI 5.1/hr, REM AHI 17/hr, min SpO2 89% Titration (11/2018) CPAP@ 9 cmH2O   CPAP COMPLIANCE DATA:  Date Range: 08/06/2023-08/04/2024  Average Daily Use: 7 hours 46 minutes  Median Use: 7 hours 53 minutes  Compliance for > 4 Hours: 99%  AHI: 1.0 respiratory events per hour  Days Used: 364/365 days  Mask Leak: 27.2  95th Percentile Pressure: 9         LABS: No results found for this or any previous visit (from the past 2160 hours).  Radiology: NM Myocar Multi W/Spect W/Wall Motion / EF Result Date: 03/03/2024   The study is normal. The study is low risk.   No ST deviation was noted.   Left ventricular function is normal. End diastolic cavity size is normal. End systolic cavity size is normal.    No results found.  No results found.    Assessment and Plan: Patient Active Problem List   Diagnosis Date Noted   Essential hypertension 11/03/2023   CAD (coronary artery disease) 07/30/2022   OSA on CPAP 08/05/2021   CPAP use counseling 08/05/2021   Atrial fibrillation (HCC) 08/05/2021   Obstructive sleep apnea 10/24/2019   A-fib (HCC) 11/21/2016   Prostate cancer (HCC) 09/24/2015   CA of prostate (HCC) 01/08/2015   HLD (hyperlipidemia) 04/13/2014   Cardiac conduction disorder 04/05/2014   ED (erectile dysfunction) of organic origin 04/05/2014    1. OSA on CPAP (Primary) The patient does tolerate PAP and reports  benefit from PAP use. The patient was reminded how to clean equipment and advised to replace supplies routinely. The patient was also counselled on weight loss. The compliance is excellent. The AHI is 1.0.   OSA on cpap- controlled. Continue with excellent compliance with pap. CPAP continues to be medically necessary to treat this patient's OSA. F/u one year.  2. CPAP use counseling CPAP Counseling: had a lengthy discussion with the patient regarding  the importance of PAP therapy in management of the sleep apnea. Patient appears to understand the risk factor reduction and also understands the risks associated with untreated sleep apnea. Patient will try to make a good faith effort to remain compliant with therapy. Also instructed the patient on proper cleaning of the device including the water must be changed daily if possible and use of distilled water is preferred. Patient understands that the machine should be regularly cleaned with appropriate recommended cleaning solutions that do not damage the PAP machine for example given white vinegar and water rinses. Other methods such as ozone treatment may not be as good as these simple methods to achieve cleaning.   3. Atrial fibrillation, unspecified type (HCC) Rate controlled, continue  metoprolol  and eliquis  per cardiology.      General Counseling: I have discussed the findings of the evaluation and examination with Kent.  I have also discussed any further diagnostic evaluation thatmay be needed or ordered today. Jamie verbalizes understanding of the findings of todays visit. We also reviewed his medications today and discussed drug interactions and side effects including but not limited excessive drowsiness and altered mental states. We also discussed that there is always a risk not just to him but also people around him. he has been encouraged to call the office with any questions or concerns that should arise related to todays visit.  No orders of the defined types were placed in this encounter.       I have personally obtained a history, examined the patient, evaluated laboratory and imaging results, formulated the assessment and plan and placed orders. This patient was seen today by Lauraine Lay, PA-C in collaboration with Dr. Elfreda Bathe.   Elfreda DELENA Bathe, MD [facility] Diplomate ABMS Pulmonary Critical Care Medicine and Sleep Medicine

## 2024-08-08 ENCOUNTER — Ambulatory Visit: Admitting: Internal Medicine

## 2024-08-08 VITALS — BP 129/70 | HR 56 | Resp 16 | Ht 75.0 in | Wt 222.0 lb

## 2024-08-08 DIAGNOSIS — Z7189 Other specified counseling: Secondary | ICD-10-CM | POA: Diagnosis not present

## 2024-08-08 DIAGNOSIS — G4733 Obstructive sleep apnea (adult) (pediatric): Secondary | ICD-10-CM

## 2024-08-08 DIAGNOSIS — I4891 Unspecified atrial fibrillation: Secondary | ICD-10-CM | POA: Diagnosis not present

## 2024-08-08 NOTE — Patient Instructions (Signed)

## 2024-11-17 ENCOUNTER — Other Ambulatory Visit

## 2024-11-17 DIAGNOSIS — N3946 Mixed incontinence: Secondary | ICD-10-CM

## 2024-11-17 DIAGNOSIS — R351 Nocturia: Secondary | ICD-10-CM

## 2024-11-17 DIAGNOSIS — C61 Malignant neoplasm of prostate: Secondary | ICD-10-CM

## 2024-11-18 ENCOUNTER — Ambulatory Visit: Payer: Self-pay | Admitting: Urology

## 2024-11-18 LAB — PSA: Prostate Specific Ag, Serum: <0.1 ng/mL (ref 0.0–4.0)

## 2024-11-21 ENCOUNTER — Other Ambulatory Visit: Payer: PPO

## 2024-11-24 ENCOUNTER — Encounter: Payer: Self-pay | Admitting: Urology

## 2024-11-24 ENCOUNTER — Ambulatory Visit: Payer: PPO | Admitting: Urology

## 2024-11-24 VITALS — BP 111/62 | HR 58 | Wt 224.0 lb

## 2024-11-24 DIAGNOSIS — C61 Malignant neoplasm of prostate: Secondary | ICD-10-CM

## 2024-11-24 DIAGNOSIS — R351 Nocturia: Secondary | ICD-10-CM | POA: Diagnosis not present

## 2024-11-24 DIAGNOSIS — N3946 Mixed incontinence: Secondary | ICD-10-CM

## 2025-11-16 ENCOUNTER — Other Ambulatory Visit

## 2025-11-24 ENCOUNTER — Ambulatory Visit: Admitting: Urology
